# Patient Record
Sex: Female | Born: 1990 | Race: White | Hispanic: No | Marital: Single | Smoking: Current every day smoker
Health system: Southern US, Community
[De-identification: ages and names within clinical notes are randomized; demographics above are authoritative.]

## PROBLEM LIST (undated history)

## (undated) DIAGNOSIS — F1910 Other psychoactive substance abuse, uncomplicated: Secondary | ICD-10-CM

## (undated) DIAGNOSIS — K0889 Other specified disorders of teeth and supporting structures: Secondary | ICD-10-CM

## (undated) DIAGNOSIS — F32A Depression, unspecified: Secondary | ICD-10-CM

## (undated) DIAGNOSIS — D649 Anemia, unspecified: Secondary | ICD-10-CM

## (undated) DIAGNOSIS — F329 Major depressive disorder, single episode, unspecified: Secondary | ICD-10-CM

## (undated) DIAGNOSIS — F4310 Post-traumatic stress disorder, unspecified: Secondary | ICD-10-CM

## (undated) DIAGNOSIS — T7840XA Allergy, unspecified, initial encounter: Secondary | ICD-10-CM

## (undated) DIAGNOSIS — R220 Localized swelling, mass and lump, head: Secondary | ICD-10-CM

## (undated) DIAGNOSIS — R05 Cough: Secondary | ICD-10-CM

## (undated) HISTORY — DX: Anemia, unspecified: D64.9

## (undated) HISTORY — PX: TONSILLECTOMY: SUR1361

## (undated) HISTORY — DX: Other psychoactive substance abuse, uncomplicated: F19.10

## (undated) HISTORY — DX: Allergy, unspecified, initial encounter: T78.40XA

---
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
## 2012-03-17 ENCOUNTER — Emergency Department (HOSPITAL_COMMUNITY)
Admission: EM | Admit: 2012-03-17 | Discharge: 2012-03-17 | Disposition: A | Discharge status: Home / Self Care | Payer: Medicaid Other | Attending: Emergency Medicine | Admitting: Emergency Medicine

## 2012-03-17 ENCOUNTER — Encounter (HOSPITAL_COMMUNITY): Payer: Self-pay | Admitting: Emergency Medicine

## 2012-03-17 DIAGNOSIS — F17200 Nicotine dependence, unspecified, uncomplicated: Secondary | ICD-10-CM | POA: Insufficient documentation

## 2012-03-17 DIAGNOSIS — K029 Dental caries, unspecified: Secondary | ICD-10-CM | POA: Insufficient documentation

## 2012-03-17 DIAGNOSIS — K0889 Other specified disorders of teeth and supporting structures: Secondary | ICD-10-CM

## 2012-03-17 MED ORDER — PENICILLIN V POTASSIUM 250 MG PO TABS
500.0000 mg | ORAL_TABLET | Freq: Once | ORAL | Status: AC
  Administered 2012-03-17: 500 mg via ORAL
  Filled 2012-03-17: qty 2

## 2012-03-17 MED ORDER — HYDROCODONE-ACETAMINOPHEN 5-325 MG PO TABS: ORAL_TABLET | ORAL | Status: AC

## 2012-03-17 MED ORDER — PENICILLIN V POTASSIUM 500 MG PO TABS: 500.0000 mg | ORAL_TABLET | Freq: Four times a day (QID) | ORAL | Status: AC

## 2012-03-17 MED ORDER — HYDROCODONE-ACETAMINOPHEN 5-325 MG PO TABS
1.0000 | ORAL_TABLET | Freq: Once | ORAL | Status: AC
  Administered 2012-03-17: 1 via ORAL
  Filled 2012-03-17: qty 1

## 2012-03-17 NOTE — ED Provider Notes (Signed)
History     CSN: [id number]  Arrival date & time 03/17/12  2012   First MD Initiated Contact with Patient 03/17/12 2100      Chief Complaint  Patient presents with  . Dental Pain    (Consider location/radiation/quality/duration/timing/severity/associated sxs/prior treatment) Patient is a 21 y.o. female presenting with tooth pain. The history is provided by the patient.  Dental PainThe primary symptoms include mouth pain. Primary symptoms do not include dental injury, oral bleeding, oral lesions, headaches, fever, shortness of breath, sore throat, angioedema or cough. The symptoms began 12 to 24 hours ago. The symptoms are worsening. The symptoms are new. The symptoms occur constantly.  Affected locations include: teeth and gum(s).  Additional symptoms include: dental sensitivity to temperature and gum tenderness. Additional symptoms do not include: gum swelling, purulent gums, trismus, jaw pain, facial swelling, trouble swallowing, drooling, ear pain and swollen glands. Medical issues include: smoking and periodontal disease.    History reviewed. No pertinent past medical history.  Past Surgical History  Procedure Date  . Tonsillectomy     History reviewed. No pertinent family history.  History  Substance Use Topics  . Smoking status: Current Everyday Smoker  . Smokeless tobacco: Not on file  . Alcohol Use: No    OB History    Grav Para Term Preterm Abortions TAB SAB Ect Mult Living                  Review of Systems  Constitutional: Negative for fever and appetite change.  HENT: Positive for dental problem. Negative for ear pain, congestion, sore throat, facial swelling, drooling, trouble swallowing, neck pain and neck stiffness.   Eyes: Negative for pain and visual disturbance.  Respiratory: Negative for cough and shortness of breath.   Neurological: Negative for dizziness, facial asymmetry and headaches.  Hematological: Negative for adenopathy.  All other systems  reviewed and are negative.    Allergies  Review of patient's allergies indicates no known allergies.  Home Medications   Current Outpatient Rx  Name Route Sig Dispense Refill  . ASPIRIN-SALICYLAMIDE-CAFFEINE 325-95-16 MG PO TABS Topical Apply 1 packet topically as needed. *Applied as needed for dental pain    . IBUPROFEN 200 MG PO TABS Oral Take 400 mg by mouth 4 (four) times daily as needed. For pain      BP 111/81  Pulse 76  Temp 98.2 F (36.8 C) (Oral)  Resp 16  Ht 5\' 7"  (1.702 m)  Wt 150 lb (68.04 kg)  BMI 23.49 kg/m2  SpO2 100%  LMP 02/24/2012  Physical Exam  Nursing note and vitals reviewed. Constitutional: She is oriented to person, place, and time. She appears well-developed and well-nourished. No distress.  HENT:  Head: Normocephalic and atraumatic. No trismus in the jaw.  Right Ear: Tympanic membrane and ear canal normal.  Left Ear: Tympanic membrane and ear canal normal.  Mouth/Throat: Uvula is midline, oropharynx is clear and moist and mucous membranes are normal. Dental caries present. No dental abscesses or uvula swelling.       Multiple dental caries and widespread dental dz  Neck: Normal range of motion. Neck supple.  Cardiovascular: Normal rate, regular rhythm and normal heart sounds.   No murmur heard. Pulmonary/Chest: Effort normal and breath sounds normal.  Musculoskeletal: Normal range of motion.  Lymphadenopathy:    She has no cervical adenopathy.  Neurological: She is alert and oriented to person, place, and time. She exhibits normal muscle tone. Coordination normal.  Skin: Skin is warm  and dry.    ED Course  Procedures (including critical care time)  Labs Reviewed - No data to display No results found.      MDM    Multiple left  Lower dental caries.  No abscess.  No facial edema or trismus.  Pt is non-toxic appearing.  Advised to f/u with a dentist.    The patient appears reasonably screened and/or stabilized for discharge and I  doubt any other medical condition or other [facility] requiring further screening, evaluation, or treatment in the ED at this time prior to discharge.   Prescribed: Pen VK Norco #[id number]      Shogo Larkey L. [place], [place] 03/19/12 1733

## 2012-03-17 NOTE — ED Notes (Signed)
Pt c/o left sided dental pain that began this morning. Pt describes pain as constant and throbbing. Pt was tearful during assessment.

## 2012-03-17 NOTE — ED Notes (Signed)
Patient complaining of throbbing pain to bottom left side of mouth that started today. States it hurts to eat or drink anything.

## 2012-03-19 NOTE — ED Provider Notes (Signed)
Medical screening examination/treatment/procedure(s) were performed by non-physician practitioner and as supervising physician I was immediately available for consultation/collaboration.   Glynn Octave, MD 03/19/12 [phone]

## 2012-09-27 ENCOUNTER — Encounter (HOSPITAL_COMMUNITY): Payer: Self-pay | Admitting: *Deleted

## 2012-09-27 ENCOUNTER — Emergency Department (HOSPITAL_COMMUNITY)
Admission: EM | Admit: 2012-09-27 | Discharge: 2012-09-27 | Disposition: A | Discharge status: Home / Self Care | Payer: Self-pay | Attending: Emergency Medicine | Admitting: Emergency Medicine

## 2012-09-27 DIAGNOSIS — R51 Headache: Secondary | ICD-10-CM | POA: Insufficient documentation

## 2012-09-27 DIAGNOSIS — K047 Periapical abscess without sinus: Secondary | ICD-10-CM | POA: Insufficient documentation

## 2012-09-27 DIAGNOSIS — F17200 Nicotine dependence, unspecified, uncomplicated: Secondary | ICD-10-CM | POA: Insufficient documentation

## 2012-09-27 MED ORDER — PENICILLIN V POTASSIUM 250 MG PO TABS: 250.0000 mg | ORAL_TABLET | Freq: Four times a day (QID) | ORAL | Status: AC

## 2012-09-27 MED ORDER — TRAMADOL HCL 50 MG PO TABS: 50.0000 mg | ORAL_TABLET | Freq: Four times a day (QID) | ORAL | Status: DC | PRN

## 2012-09-27 NOTE — ED Provider Notes (Signed)
History     CSN: [id number]  Arrival date & time 09/27/12  1352   First MD Initiated Contact with Patient 09/27/12 1452      Chief Complaint  Patient presents with  . Dental Pain    HPI [name] is a 22 y.o. female who presents to the ED with dental pain. The pain started yesterday. She also reports swelling, pain and bloody drainage around the left lower second molar.the history was provided by the patient.   History reviewed. No pertinent past medical history.  Past Surgical History  Procedure Laterality Date  . Tonsillectomy      No family history on file.  History  Substance Use Topics  . Smoking status: Current Every Day Smoker  . Smokeless tobacco: Not on file  . Alcohol Use: No    OB History   Grav Para Term Preterm Abortions TAB SAB Ect Mult Living                  Review of Systems  Constitutional: Negative for fever, chills and activity change.  HENT: Positive for dental problem. Negative for ear pain, facial swelling and neck pain.   Eyes: Negative for pain.  Respiratory: Negative for cough and wheezing.   Cardiovascular: Negative for chest pain.  Gastrointestinal: Negative for nausea, vomiting and abdominal pain.  Skin: Negative for rash.  Neurological: Positive for headaches.  Psychiatric/Behavioral: Negative for decreased concentration. The patient is not nervous/anxious.     Allergies  Review of patient's allergies indicates no known allergies.  Home Medications   Current Outpatient Rx  Name  Route  Sig  Dispense  Refill  . Aspirin-Salicylamide-Caffeine (BC HEADACHE) 325-95-16 MG TABS   Topical   Apply 1 packet topically as needed. *Applied as needed for dental pain         . ibuprofen (ADVIL,MOTRIN) 200 MG tablet   Oral   Take 400 mg by mouth 4 (four) times daily as needed. For pain           BP 117/65  Pulse 74  Temp(Src) 98.3 F (36.8 C) (Oral)  Resp 16  SpO2 99%  LMP 09/20/2012  Physical Exam  Nursing note and  vitals reviewed. Constitutional: She is oriented to person, place, and time. She appears well-developed and well-nourished. No distress.  HENT:  Head: Normocephalic and atraumatic.  Right Ear: External ear normal.  Left Ear: External ear normal.  Mouth/Throat: Uvula is midline and oropharynx is clear and moist.    Swelling and tenderness of gum noted surrounding second lower molar left.   Eyes: EOM are normal. Pupils are equal, round, and reactive to light.  Neck: Neck supple.  Cardiovascular: Normal rate and regular rhythm.   Pulmonary/Chest: Effort normal and breath sounds normal.  Musculoskeletal: Normal range of motion. She exhibits no edema.  Neurological: She is alert and oriented to person, place, and time. No cranial nerve deficit.  Skin: Skin is warm and dry.  Psychiatric: She has a normal mood and affect. Her behavior is normal. Judgment and thought content normal.   Procedures  Assessment: 22 y.o. female with dental pain   Abscess second left lower molar  Plan:  Pain management   Antibiotic  Discussed with the patient and all questioned fully answered.   Medication List    TAKE these medications       penicillin v potassium 250 MG tablet  Commonly known as:  VEETID  Take 1 tablet (250 mg total) by mouth 4 (  four) times daily.     traMADol 50 MG tablet  Commonly known as:  ULTRAM  Take 1 tablet (50 mg total) by mouth every 6 (six) hours as needed for pain.      ASK your doctor about these medications       BC HEADACHE 325-95-16 MG Tabs  Generic drug:  Aspirin-Salicylamide-Caffeine  Apply 1 packet topically as needed. *Applied as needed for dental pain     ibuprofen 200 MG tablet  Commonly known as:  ADVIL,MOTRIN  Take 400 mg by mouth 4 (four) times daily as needed. For pain           [facility], NP 09/27/12 1535

## 2012-09-27 NOTE — ED Provider Notes (Signed)
Medical screening examination/treatment/procedure(s) were performed by non-physician practitioner and as supervising physician I was immediately available for consultation/collaboration. Jennamarie Goings, MD, FACEP   Christana Angelica L Christien Frankl, MD 09/27/12 1622 

## 2012-09-27 NOTE — ED Notes (Signed)
Dental pain since yesterday. Pt also states bleeding and swelling to the gums.

## 2012-09-27 NOTE — ED Notes (Signed)
Pt with dental pain since yesterday, denies seeing a dentist, states that her medicaid ran out, informed pt that she will have to F/U with dentist soon

## 2013-11-02 ENCOUNTER — Encounter (HOSPITAL_COMMUNITY): Payer: Self-pay | Admitting: Emergency Medicine

## 2013-11-02 ENCOUNTER — Emergency Department (HOSPITAL_COMMUNITY)
Admission: EM | Admit: 2013-11-02 | Discharge: 2013-11-02 | Disposition: A | Discharge status: Home / Self Care | Payer: Medicaid Other | Attending: Emergency Medicine | Admitting: Emergency Medicine

## 2013-11-02 DIAGNOSIS — O909 Complication of the puerperium, unspecified: Principal | ICD-10-CM

## 2013-11-02 DIAGNOSIS — K089 Disorder of teeth and supporting structures, unspecified: Secondary | ICD-10-CM | POA: Insufficient documentation

## 2013-11-02 DIAGNOSIS — O26899 Other specified pregnancy related conditions, unspecified trimester: Secondary | ICD-10-CM | POA: Insufficient documentation

## 2013-11-02 DIAGNOSIS — O99335 Smoking (tobacco) complicating the puerperium: Secondary | ICD-10-CM | POA: Insufficient documentation

## 2013-11-02 DIAGNOSIS — K029 Dental caries, unspecified: Secondary | ICD-10-CM | POA: Insufficient documentation

## 2013-11-02 DIAGNOSIS — K0889 Other specified disorders of teeth and supporting structures: Secondary | ICD-10-CM

## 2013-11-02 MED ORDER — BUPIVACAINE HCL (PF) 0.25 % IJ SOLN
10.0000 mL | Freq: Once | INTRAMUSCULAR | Status: AC
  Administered 2013-11-02: 10 mL

## 2013-11-02 MED ORDER — AMOXICILLIN 500 MG PO CAPS: 500.0000 mg | ORAL_CAPSULE | Freq: Three times a day (TID) | ORAL | Status: DC

## 2013-11-02 MED ORDER — TRAMADOL HCL 50 MG PO TABS: 50.0000 mg | ORAL_TABLET | Freq: Four times a day (QID) | ORAL | Status: DC | PRN

## 2013-11-02 NOTE — ED Notes (Signed)
Right sided tooth pain x 2 days.  Sore throat starting today.

## 2013-11-02 NOTE — ED Provider Notes (Signed)
CSN: [id number]     Arrival date & time 11/02/13  1418 History   None    Chief Complaint  Patient presents with  . Sore Throat  . Dental Pain     (Consider location/radiation/quality/duration/timing/severity/associated sxs/prior Treatment) HPI  Patient presents to the emergency department with a dental complaint. Symptoms began a few weeks ago but she was unable to have her teeth pulled since she was pregnant. Now she has had the baby (5 days ago) and her pregnancy medicaid has lapsed. The patient has tried to alleviate pain with goody powders, she is not breast feeding..  Pain rated at a 10/10, characterized as throbbing in nature and located right lower molar. Patient denies fever, night sweats, chills, difficulty swallowing or opening mouth, SOB, nuchal rigidity or decreased ROM of neck.  Patient does not have a dentist and requests a resource guide at discharge.   History reviewed. No pertinent past medical history. Past Surgical History  Procedure Laterality Date  . Tonsillectomy     No family history on file. History  Substance Use Topics  . Smoking status: Current Every Day Smoker    Types: Cigarettes  . Smokeless tobacco: Not on file  . Alcohol Use: No   OB History   Grav Para Term Preterm Abortions TAB SAB Ect Mult Living                 Review of Systems  The patient denies anorexia, fever, weight loss,, vision loss, decreased hearing, hoarseness, chest pain, syncope, dyspnea on exertion, peripheral edema, balance deficits, hemoptysis, abdominal pain, melena, hematochezia, severe indigestion/heartburn, hematuria, incontinence, genital sores, muscle weakness, suspicious skin lesions, transient blindness, difficulty walking, depression, unusual weight change, abnormal bleeding, enlarged lymph nodes, angioedema, and breast masses.   Allergies  Review of patient's allergies indicates no known allergies.  Home Medications   Current Outpatient Rx  Name  Route  Sig   Dispense  Refill  . amoxicillin (AMOXIL) 500 MG capsule   Oral   Take 1 capsule (500 mg total) by mouth 3 (three) times daily.   21 capsule   0   . Aspirin-Salicylamide-Caffeine (BC HEADACHE) 325-95-16 MG TABS   Topical   Apply 1 packet topically as needed. *Applied as needed for dental pain         . ibuprofen (ADVIL,MOTRIN) 200 MG tablet   Oral   Take 400 mg by mouth 4 (four) times daily as needed. For pain         . traMADol (ULTRAM) 50 MG tablet   Oral   Take 1 tablet (50 mg total) by mouth every 6 (six) hours as needed for pain.   15 tablet   0   . traMADol (ULTRAM) 50 MG tablet   Oral   Take 1 tablet (50 mg total) by mouth every 6 (six) hours as needed.   15 tablet   0    BP 116/72  Pulse 101  Temp(Src) 98.2 F (36.8 C) (Oral)  Resp 18  Ht 5\' 7"  (1.702 m)  Wt 163 lb 1 oz (73.965 kg)  BMI 25.53 kg/m2  SpO2 97%  LMP 11/02/2013 Physical Exam  Constitutional: She appears well-developed and well-nourished. No distress.  HENT:  Head: Normocephalic and atraumatic.  Mouth/Throat: Uvula is midline, oropharynx is clear and moist and mucous membranes are normal. Normal dentition. Dental caries (Pts tooth shows no obvious abscess but moderate to severe tenderness to palpation of marked tooth) present. No uvula swelling.    Eyes:  Pupils are equal, round, and reactive to light.  Neck: Trachea normal, normal range of motion and full passive range of motion without pain. Neck supple.  Cardiovascular: Normal rate, regular rhythm, normal heart sounds and normal pulses.   Pulmonary/Chest: Effort normal and breath sounds normal. No respiratory distress. Chest wall is not dull to percussion. She exhibits no tenderness, no crepitus, no edema, no deformity and no retraction.  Abdominal: Normal appearance.  Musculoskeletal: Normal range of motion.  Neurological: She is alert. She has normal strength.  Skin: Skin is warm, dry and intact. She is not diaphoretic.  Psychiatric: She  has a normal mood and affect. Her speech is normal. Cognition and memory are normal.    ED Course  NERVE BLOCK Date/Time: 11/02/2013 2:50 PM Performed by: Dorthula MatasGREENE, Brindley Madarang G Authorized by: Dorthula MatasGREENE, Jaysie Benthall G Consent: Verbal consent obtained. Risks and benefits: risks, benefits and alternatives were discussed Consent given by: patient Indications: pain relief Body area: face/mouth Laterality: right (right lower molar) Patient sedated: no Needle gauge: 25 G Local anesthetic: bupivacaine 0.25% without epinephrine Anesthetic total: 3 ml Outcome: pain improved Patient tolerance: Patient tolerated the procedure well with no immediate complications. Comments: Pt had complete relief of pain   (including critical care time) Labs Review Labs Reviewed - No data to display Imaging Review No results found.   EKG Interpretation None      MDM   Final diagnoses:  Pain, dental    Patient has dental pain. No emergent s/sx's present. Patent airway. No trismus.  Will be given pain medication and antibiotics. I discussed the need to call dentist within 24/48 hours for follow-up. Dental referral given. Return to ED precautions given.  Pt voiced understanding and has agreed to follow-up.   23 y.o.Pam Soto's evaluation in the Emergency Department is complete. It has been determined that no acute conditions requiring further emergency intervention are present at this time. The patient/guardian have been advised of the diagnosis and plan. We have discussed signs and symptoms that warrant return to the ED, such as changes or worsening in symptoms.  Vital signs are stable at discharge. Filed Vitals:   11/02/13 1425  BP: 116/72  Pulse: 101  Temp: 98.2 F (36.8 C)  Resp: 18    Patient/guardian has voiced understanding and agreed to follow-up with the PCP or specialist.      Dorthula Matasiffany G Tremel Setters, PA-C 11/02/13 1451

## 2013-11-02 NOTE — Discharge Instructions (Signed)
Dental Care and Dentist Visits °Dental care supports good overall health. Regular dental visits can also help you avoid dental pain, bleeding, infection, and other more serious health problems in the future. It is important to keep the mouth healthy because diseases in the teeth, gums, and other oral tissues can spread to other areas of the body. Some problems, such as diabetes, heart disease, and pre-term labor have been associated with poor oral health.  °See your dentist every 6 months. If you experience emergency problems such as a toothache or broken tooth, go to the dentist right away. If you see your dentist regularly, you may catch problems early. It is easier to be treated for problems in the early stages.  °WHAT TO EXPECT AT A DENTIST VISIT  °Your dentist will look for many common oral health problems and recommend proper treatment. At your regular dental visit, you can expect: °· Gentle cleaning of the teeth and gums. This includes scraping and polishing. This helps to remove the sticky substance around the teeth and gums (plaque). Plaque forms in the mouth shortly after eating. Over time, plaque hardens on the teeth as tartar. If tartar is not removed regularly, it can cause problems. Cleaning also helps remove stains. °· Periodic X-rays. These pictures of the teeth and supporting bone will help your dentist assess the health of your teeth. °· Periodic fluoride treatments. Fluoride is a natural mineral shown to help strengthen teeth. Fluoride treatment involves applying a fluoride gel or varnish to the teeth. It is most commonly done in children. °· Examination of the mouth, tongue, jaws, teeth, and gums to look for any oral health problems, such as: °· Cavities (dental caries). This is decay on the tooth caused by plaque, sugar, and acid in the mouth. It is best to catch a cavity when it is small. °· Inflammation of the gums caused by plaque buildup (gingivitis). °· Problems with the mouth or malformed  or misaligned teeth. °· Oral cancer or other diseases of the soft tissues or jaws.  °KEEP YOUR TEETH AND GUMS HEALTHY °For healthy teeth and gums, follow these general guidelines as well as your dentist's specific advice: °· Have your teeth professionally cleaned at the dentist every 6 months. °· Brush twice daily with a fluoride toothpaste. °· Floss your teeth daily.  °· Ask your dentist if you need fluoride supplements, treatments, or fluoride toothpaste. °· Eat a healthy diet. Reduce foods and drinks with added sugar. °· Avoid smoking. °TREATMENT FOR ORAL HEALTH PROBLEMS °If you have oral health problems, treatment varies depending on the conditions present in your teeth and gums. °· Your caregiver will most likely recommend good oral hygiene at each visit. °· For cavities, gingivitis, or other oral health disease, your caregiver will perform a procedure to treat the problem. This is typically done at a separate appointment. Sometimes your caregiver will refer you to another dental specialist for specific tooth problems or for surgery. °SEEK IMMEDIATE DENTAL CARE IF: °· You have pain, bleeding, or soreness in the gum, tooth, jaw, or mouth area. °· A permanent tooth becomes loose or separated from the gum socket. °· You experience a blow or injury to the mouth or jaw area. °Document Released: 04/10/2011 Document Revised: 10/21/2011 Document Reviewed: 04/10/2011 °ExitCare® Patient Information ©2014 [employer]. ° °Dental Pain °A tooth ache may be caused by cavities (tooth decay). Cavities expose the nerve of the tooth to air and hot or cold temperatures. It may come from an infection or abscess (also called a   boil or furuncle) around your tooth. It is also often caused by dental caries (tooth decay). This causes the pain you are having. °DIAGNOSIS  °Your caregiver can diagnose this problem by exam. °TREATMENT  °· If caused by an infection, it may be treated with medications which kill germs (antibiotics) and pain  medications as prescribed by your caregiver. Take medications as directed. °· Only take over-the-counter or prescription medicines for pain, discomfort, or fever as directed by your caregiver. °· Whether the tooth ache today is caused by infection or dental disease, you should see your dentist as soon as possible for further care. °SEEK MEDICAL CARE IF: °The exam and treatment you received today has been provided on an emergency basis only. This is not a substitute for complete medical or dental care. If your problem worsens or new problems (symptoms) appear, and you are unable to meet with your dentist, call or return to this location. °SEEK IMMEDIATE MEDICAL CARE IF:  °· You have a fever. °· You develop redness and swelling of your face, jaw, or neck. °· You are unable to open your mouth. °· You have severe pain uncontrolled by pain medicine. °MAKE SURE YOU:  °· Understand these instructions. °· Will watch your condition. °· Will get help right away if you are not doing well or get worse. °Document Released: 07/29/2005 Document Revised: 10/21/2011 Document Reviewed: 03/16/2008 °ExitCare® Patient Information ©2014 [employer]. ° °

## 2013-11-02 NOTE — ED Notes (Signed)
Dental pain , rt mandibular molar  For 3 days

## 2013-11-05 NOTE — ED Provider Notes (Signed)
Medical screening examination/treatment/procedure(s) were performed by non-physician practitioner and as supervising physician I was immediately available for consultation/collaboration.   EKG Interpretation None       Lamarr Feenstra, MD 11/05/13 1615 

## 2014-04-17 ENCOUNTER — Emergency Department (HOSPITAL_COMMUNITY)
Admission: EM | Admit: 2014-04-17 | Discharge: 2014-04-17 | Disposition: A | Discharge status: Home / Self Care | Payer: Medicaid Other | Attending: Emergency Medicine | Admitting: Emergency Medicine

## 2014-04-17 ENCOUNTER — Encounter (HOSPITAL_COMMUNITY): Payer: Self-pay | Admitting: Emergency Medicine

## 2014-04-17 DIAGNOSIS — F17200 Nicotine dependence, unspecified, uncomplicated: Secondary | ICD-10-CM | POA: Insufficient documentation

## 2014-04-17 DIAGNOSIS — H6120 Impacted cerumen, unspecified ear: Secondary | ICD-10-CM | POA: Insufficient documentation

## 2014-04-17 DIAGNOSIS — J029 Acute pharyngitis, unspecified: Secondary | ICD-10-CM | POA: Insufficient documentation

## 2014-04-17 DIAGNOSIS — K029 Dental caries, unspecified: Secondary | ICD-10-CM | POA: Insufficient documentation

## 2014-04-17 DIAGNOSIS — H6121 Impacted cerumen, right ear: Secondary | ICD-10-CM

## 2014-04-17 MED ORDER — MAGIC MOUTHWASH W/LIDOCAINE: 5.0000 mL | Freq: Three times a day (TID) | ORAL | Status: DC | PRN

## 2014-04-17 MED ORDER — ANTIPYRINE-BENZOCAINE 5.4-1.4 % OT SOLN
3.0000 [drp] | Freq: Once | OTIC | Status: AC
  Administered 2014-04-17: 4 [drp] via OTIC
  Filled 2014-04-17: qty 10

## 2014-04-17 NOTE — ED Notes (Signed)
Pt c/o sore throat and right ear pain that started yesterday. Dry cough and congestion.

## 2014-04-17 NOTE — ED Provider Notes (Signed)
CSN: [id number]     Arrival date & time 04/17/14  1224 History  This chart was scribed for non-physician practitioner, Pauline Aus, PA-C,working with Vida Roller, MD, by Karle Plumber, ED Scribe. This patient was seen in room APFT24/APFT24 and the patient's care was started at 1:31 PM.  Chief Complaint  Patient presents with  . Sore Throat   Patient is a 23 y.o. female presenting with pharyngitis. The history is provided by the patient. No language interpreter was used.  Sore Throat Pertinent negatives include no chest pain, no abdominal pain and no shortness of breath.   HPI Comments:  NOVELLA ABRAHA is a 23 y.o. female who presents to the Emergency Department complaining of moderate sore throat and right ear pain that started two days ago.  Reports decreased hearing from her right ear, dry cough and some mild congestion. She reports taking Advil and some OTC cold medication with minimal relief. Pt is unaware of any sick contacts. She denies fever, chills, productive cough or shortness of breath,  or ear drainage.   History reviewed. No pertinent past medical history. Past Surgical History  Procedure Laterality Date  . Tonsillectomy     History reviewed. No pertinent family history. History  Substance Use Topics  . Smoking status: Current Every Day Smoker    Types: Cigarettes  . Smokeless tobacco: Not on file  . Alcohol Use: No   OB History   Grav Para Term Preterm Abortions TAB SAB Ect Mult Living                 Review of Systems  Constitutional: Negative for fever, chills and fatigue.  HENT: Positive for congestion, ear pain and sore throat. Negative for ear discharge and trouble swallowing.   Respiratory: Negative for cough, chest tightness, shortness of breath and wheezing.   Cardiovascular: Negative for chest pain and palpitations.  Gastrointestinal: Negative for nausea, vomiting, abdominal pain and blood in stool.  Genitourinary: Negative for dysuria, hematuria  and flank pain.  Musculoskeletal: Negative for arthralgias, back pain, myalgias, neck pain and neck stiffness.  Skin: Negative for rash.  Neurological: Negative for dizziness, weakness and numbness.  Hematological: Does not bruise/bleed easily.    Allergies  Review of patient's allergies indicates no known allergies.  Home Medications   Prior to Admission medications   Medication Sig Start Date End Date Taking? Authorizing Provider  Pseudoeph-Doxylamine-DM-APAP (NIGHT TIME COLD MEDICINE PO) Take 1 tablet by mouth at bedtime as needed (Cold Symptoms).   Yes Historical Provider, MD   Triage Vitals: BP 113/58  Pulse 84  Temp(Src) 98.9 F (37.2 C) (Oral)  Resp 20  Ht  (1.676 m)  Wt 165 lb (74.844 kg)  BMI 26.64 kg/m2  SpO2 100%  LMP 04/17/2014 Physical Exam  Nursing note and vitals reviewed. Constitutional: She is oriented to person, place, and time. She appears well-developed and well-nourished.  HENT:  Head: Normocephalic and atraumatic.  Mouth/Throat: Uvula is midline and mucous membranes are normal. No oral lesions. No trismus in the jaw. Dental caries present. No dental abscesses or uvula swelling. Posterior oropharyngeal erythema present. No oropharyngeal exudate, posterior oropharyngeal edema or tonsillar abscesses.  Bilateral ears with cerumen impaction. Right TM not visualized.  Left TM appears intact. Multiple dental caries with poor dental hygiene.  Eyes: EOM are normal.  Neck: Normal range of motion. Neck supple.  Cardiovascular: Normal rate, normal heart sounds and intact distal pulses.   No murmur heard. Pulmonary/Chest: Effort normal and breath  sounds normal. No respiratory distress.  Musculoskeletal: Normal range of motion.  Lymphadenopathy:    She has no cervical adenopathy.  Neurological: She is alert and oriented to person, place, and time.  Skin: Skin is warm and dry.  Psychiatric: She has a normal mood and affect. Her behavior is normal.    ED  Course  Procedures (including critical care time) DIAGNOSTIC STUDIES: Oxygen Saturation is 100% on RA, normal by my interpretation.   COORDINATION OF CARE: 1:36 PM- Will irrigate ears. . Pt verbalizes understanding and agrees to plan.  Medications  antipyrine-benzocaine (AURALGAN) otic solution 3-4 drop (not administered)    Labs Review Labs Reviewed - No data to display  Imaging Review No results found.   EKG Interpretation None      MDM   Final diagnoses:  Pharyngitis  Cerumen impaction, right    Pt is well appearing, MM moist, airway patent no hx of fever, neck pain, or lymphadenopathy, likely viral illness. On recheck, large amt of cerumen was irrigated from the right ear and pain resolved.   Pt reports feeling better and ready  for d/c.    I personally performed the services described in this documentation, which was scribed in my presence. The recorded information has been reviewed and is accurate.    Younes Degeorge L. Trisha Mangle, PA-C 04/18/14 2135

## 2014-04-17 NOTE — Discharge Instructions (Signed)
Cerumen Impaction A cerumen impaction is when the wax in your ear forms a plug. This plug usually causes reduced hearing. Sometimes it also causes an earache or dizziness. Removing a cerumen impaction can be difficult and painful. The wax sticks to the ear canal. The canal is sensitive and bleeds easily. If you try to remove a heavy wax buildup with a cotton tipped swab, you may push it in further. Irrigation with water, suction, and small ear curettes may be used to clear out the wax. If the impaction is fixed to the skin in the ear canal, ear drops may be needed for a few days to loosen the wax. People who build up a lot of wax frequently can use ear wax removal products available in your local drugstore. SEEK MEDICAL CARE IF:  You develop an earache, increased hearing loss, or marked dizziness. Document Released: 09/05/2004 Document Revised: 10/21/2011 Document Reviewed: 10/26/2009 [facility] Patient Information 2015 [place], [place]. This information is not intended to replace advice given to you by your health care provider. Make sure you discuss any questions you have with your health care provider.  Pharyngitis Pharyngitis is a sore throat (pharynx). There is redness, pain, and swelling of your throat. HOME CARE   Drink enough fluids to keep your pee (urine) clear or pale yellow.  Only take medicine as told by your doctor.  You may get sick again if you do not take medicine as told. Finish your medicines, even if you start to feel better.  Do not take aspirin.  Rest.  Rinse your mouth (gargle) with salt water ( tsp of salt per 1 qt of water) every 1-2 hours. This will help the pain.  If you are not at risk for choking, you can suck on hard candy or sore throat lozenges. GET HELP IF:  You have large, tender lumps on your neck.  You have a rash.  You cough up green, yellow-brown, or bloody spit. GET HELP RIGHT AWAY IF:   You have a stiff neck.  You drool or cannot swallow  liquids.  You throw up (vomit) or are not able to keep medicine or liquids down.  You have very bad pain that does not go away with medicine.  You have problems breathing (not from a stuffy nose). MAKE SURE YOU:   Understand these instructions.  Will watch your condition.  Will get help right away if you are not doing well or get worse. Document Released: 01/15/2008 Document Revised: 05/19/2013 Document Reviewed: 04/05/2013 [facility] Patient Information 2015 [place], [place]. This information is not intended to replace advice given to you by your health care provider. Make sure you discuss any questions you have with your health care provider.

## 2014-04-20 NOTE — ED Provider Notes (Signed)
Medical screening examination/treatment/procedure(s) were performed by non-physician practitioner and as supervising physician I was immediately available for consultation/collaboration.    Makyle Eslick D Evgenia Merriman, MD 04/20/14 2334 

## 2014-08-02 ENCOUNTER — Emergency Department (HOSPITAL_COMMUNITY)
Admission: EM | Admit: 2014-08-02 | Discharge: 2014-08-02 | Disposition: A | Discharge status: Home / Self Care | Payer: Medicaid Other | Attending: Emergency Medicine | Admitting: Emergency Medicine

## 2014-08-02 ENCOUNTER — Encounter (HOSPITAL_COMMUNITY): Payer: Self-pay | Admitting: Emergency Medicine

## 2014-08-02 DIAGNOSIS — K047 Periapical abscess without sinus: Secondary | ICD-10-CM

## 2014-08-02 DIAGNOSIS — K029 Dental caries, unspecified: Secondary | ICD-10-CM | POA: Insufficient documentation

## 2014-08-02 DIAGNOSIS — K088 Other specified disorders of teeth and supporting structures: Secondary | ICD-10-CM | POA: Diagnosis present

## 2014-08-02 DIAGNOSIS — Z720 Tobacco use: Secondary | ICD-10-CM | POA: Diagnosis not present

## 2014-08-02 MED ORDER — IBUPROFEN 800 MG PO TABS
800.0000 mg | ORAL_TABLET | Freq: Once | ORAL | Status: AC
  Administered 2014-08-02: 800 mg via ORAL
  Filled 2014-08-02: qty 1

## 2014-08-02 MED ORDER — IBUPROFEN 800 MG PO TABS: 800.0000 mg | ORAL_TABLET | Freq: Three times a day (TID) | ORAL | Status: DC

## 2014-08-02 MED ORDER — ACETAMINOPHEN-CODEINE #3 300-30 MG PO TABS
2.0000 | ORAL_TABLET | Freq: Once | ORAL | Status: AC
  Administered 2014-08-02: 2 via ORAL
  Filled 2014-08-02: qty 2

## 2014-08-02 MED ORDER — AMOXICILLIN 500 MG PO CAPS: 500.0000 mg | ORAL_CAPSULE | Freq: Three times a day (TID) | ORAL | Status: DC

## 2014-08-02 MED ORDER — PROMETHAZINE HCL 12.5 MG PO TABS
12.5000 mg | ORAL_TABLET | Freq: Once | ORAL | Status: AC
  Administered 2014-08-02: 12.5 mg via ORAL
  Filled 2014-08-02: qty 1

## 2014-08-02 MED ORDER — AMOXICILLIN 250 MG PO CAPS
500.0000 mg | ORAL_CAPSULE | Freq: Once | ORAL | Status: AC
  Administered 2014-08-02: 500 mg via ORAL
  Filled 2014-08-02: qty 2

## 2014-08-02 MED ORDER — ACETAMINOPHEN-CODEINE #3 300-30 MG PO TABS: 1.0000 | ORAL_TABLET | Freq: Four times a day (QID) | ORAL | Status: DC | PRN

## 2014-08-02 NOTE — ED Notes (Signed)
Pt reports abscess of L upper jaw tooth.

## 2014-08-02 NOTE — ED Provider Notes (Signed)
CSN: [id number]     Arrival date & time 08/02/14  1332 History   First MD Initiated Contact with Patient 08/02/14 1445     Chief Complaint  Patient presents with  . Dental Pain     (Consider location/radiation/quality/duration/timing/severity/associated sxs/prior Treatment) Patient is a 23 y.o. female presenting with tooth pain. The history is provided by the patient.  Dental Pain Location:  Upper Quality:  Throbbing Severity:  Severe Onset quality:  Gradual Duration:  3 days Timing:  Intermittent Progression:  Worsening Chronicity:  New Context: dental caries   Relieved by:  Nothing Worsened by:  Cold food/drink Associated symptoms: gum swelling   Associated symptoms: no fever, no neck pain and no neck swelling   Risk factors: lack of dental care and smoking     History reviewed. No pertinent past medical history. Past Surgical History  Procedure Laterality Date  . Tonsillectomy     History reviewed. No pertinent family history. History  Substance Use Topics  . Smoking status: Current Every Day Smoker -- 0.50 packs/day    Types: Cigarettes  . Smokeless tobacco: Never Used  . Alcohol Use: No   OB History    Gravida Para Term Preterm AB TAB SAB Ectopic Multiple Living   2 2 2             Review of Systems  Constitutional: Negative for fever and activity change.       All ROS Neg except as noted in HPI  HENT: Positive for dental problem. Negative for nosebleeds.   Eyes: Negative for photophobia and discharge.  Respiratory: Negative for cough, shortness of breath and wheezing.   Cardiovascular: Negative for chest pain and palpitations.  Gastrointestinal: Negative for abdominal pain and blood in stool.  Genitourinary: Negative for dysuria, frequency and hematuria.  Musculoskeletal: Negative for back pain, arthralgias and neck pain.  Skin: Negative.   Neurological: Negative for dizziness, seizures and speech difficulty.  Psychiatric/Behavioral: Negative for  hallucinations and confusion.      Allergies  Review of patient's allergies indicates no known allergies.  Home Medications   Prior to Admission medications   Medication Sig Start Date End Date Taking? Authorizing Provider  ibuprofen (ADVIL,MOTRIN) 200 MG tablet Take 600 mg by mouth every 4 (four) hours as needed for moderate pain.   Yes Historical Provider, MD  Alum & Mag Hydroxide-Simeth (MAGIC MOUTHWASH W/LIDOCAINE) SOLN Take 5 mLs by mouth 3 (three) times daily as needed for mouth pain. Swish and spit, do not swallow Patient not taking: Reported on 08/02/2014 04/17/14   Tammy L. Triplett, PA-C   BP 115/69 mmHg  Pulse 68  Temp(Src) 98.6 F (37 C) (Oral)  Resp 14  Ht 5\' 6"  (1.676 m)  Wt 150 lb (68.04 kg)  BMI 24.22 kg/m2  SpO2 100%  LMP 07/10/2014 Physical Exam  Constitutional: She is oriented to person, place, and time. She appears well-developed and well-nourished.  Non-toxic appearance.  HENT:  Head: Normocephalic.  Right Ear: Tympanic membrane and external ear normal.  Left Ear: Tympanic membrane and external ear normal.  Multiple dental caries of the upper and lower gum areas. There is swelling of the left upper gum. No visible abscess appreciated. There is tenderness to palpation of the left jaw area. The airway is patent. There is no swelling under the tongue.  Eyes: EOM and lids are normal. Pupils are equal, round, and reactive to light.  Neck: Normal range of motion. Neck supple. Carotid bruit is not present.  Cardiovascular: Normal  rate, regular rhythm, normal heart sounds, intact distal pulses and normal pulses.   Pulmonary/Chest: Breath sounds normal. No respiratory distress.  Abdominal: Soft. Bowel sounds are normal. There is no tenderness. There is no guarding.  Musculoskeletal: Normal range of motion.  Lymphadenopathy:       Head (right side): No submandibular adenopathy present.       Head (left side): No submandibular adenopathy present.    She has no  cervical adenopathy.  Neurological: She is alert and oriented to person, place, and time. She has normal strength. No cranial nerve deficit or sensory deficit.  Skin: Skin is warm and dry.  Psychiatric: She has a normal mood and affect. Her speech is normal.  Nursing note and vitals reviewed.   ED Course  Procedures (including critical care time) Labs Review Labs Reviewed - No data to display  Imaging Review No results found.   EKG Interpretation None      MDM  Patient has multiple dental caries of the upper and lower jaw area. There is swelling about the gum, but no visible abscess. There is no evidence for Ludwig's Angina.  Patient is treated with ibuprofen 800 mg, Amoxil 500 mg, and Tylenol with Codeine. She is encouraged to see a dentist as soon as possible.    Final diagnoses:  None    **I have reviewed nursing notes, vital signs, and all appropriate lab and imaging results for this patient.Kathie Dike*    Rilyn Upshaw M Dicie Edelen, PA-C 08/02/14 1517  Donnetta HutchingBrian Cook, MD 08/03/14 [phone]

## 2014-08-02 NOTE — Discharge Instructions (Signed)
Dental Caries °Dental caries is tooth decay. This decay can cause a hole in teeth (cavity) that can get bigger and deeper over time. °HOME CARE °· Brush and floss your teeth. Do this at least two times a day. °· Use a fluoride toothpaste. °· Use a mouth rinse if told by your dentist or doctor. °· Eat less sugary and starchy foods. Drink less sugary drinks. °· Avoid snacking often on sugary and starchy foods. Avoid sipping often on sugary drinks. °· Keep regular checkups and cleanings with your dentist. °· Use fluoride supplements if told by your dentist or doctor. °· Allow fluoride to be applied to teeth if told by your dentist or doctor. °Document Released: 05/07/2008 Document Revised: 12/13/2013 Document Reviewed: 07/31/2012 °[employer]® Patient Information ©2015 [employer]. This information is not intended to replace advice given to you by your health care provider. Make sure you discuss any questions you have with your health care provider. ° °

## 2015-04-13 DIAGNOSIS — K0889 Other specified disorders of teeth and supporting structures: Secondary | ICD-10-CM

## 2015-04-13 DIAGNOSIS — R220 Localized swelling, mass and lump, head: Secondary | ICD-10-CM

## 2015-04-13 HISTORY — DX: Other specified disorders of teeth and supporting structures: K08.89

## 2015-04-13 HISTORY — DX: Localized swelling, mass and lump, head: R22.0

## 2015-05-03 NOTE — H&P (Signed)
HISTORY AND PHYSICAL  Pam Soto is a 24 y.o. female patient with CC: painful teeth.  Denies methamphetamine use.  No diagnosis found.  No past medical history on file.  No current facility-administered medications for this encounter.   Current Outpatient Prescriptions  Medication Sig Dispense Refill  . acetaminophen-codeine (TYLENOL #3) 300-30 MG per tablet Take 1-2 tablets by mouth every 6 (six) hours as needed. 20 tablet 0  . Alum & Mag Hydroxide-Simeth (MAGIC MOUTHWASH W/LIDOCAINE) SOLN Take 5 mLs by mouth 3 (three) times daily as needed for mouth pain. Swish and spit, do not swallow (Patient not taking: Reported on 08/02/2014) 100 mL 0  . amoxicillin (AMOXIL) 500 MG capsule Take 1 capsule (500 mg total) by mouth 3 (three) times daily. 21 capsule 0  . ibuprofen (ADVIL,MOTRIN) 800 MG tablet Take 1 tablet (800 mg total) by mouth 3 (three) times daily. 21 tablet 0   No Known Allergies Active Problems:   * No active hospital problems. *  Vitals: There were no vitals taken for this visit. Lab results:No results found for this or any previous visit (from the past 24 hour(s)). Radiology Results: No results found. General appearance: alert, cooperative and no distress Head: Normocephalic, without obvious abnormality, atraumatic Eyes: negative Nose: Nares normal. Septum midline. Mucosa normal. No drainage or sinus tenderness. Throat: Gross caries all remaining teeth except #28 and 29. Gross plaque and calculus. No purulence, trismus, or exudates. Pharynx clear.  Neck: no adenopathy, supple, symmetrical, trachea midline and thyroid not enlarged, symmetric, no tenderness/mass/nodules Resp: clear to auscultation bilaterally Cardio: regular rate and rhythm, S1, S2 normal, no murmur, click, rub or gallop  Assessment: Rampant dental caries  Plan:Extract multiple teeth, alveoloplasty. General anesthesia. Day surgery.   Georgia Lopes 05/03/2015

## 2015-05-04 ENCOUNTER — Encounter (HOSPITAL_BASED_OUTPATIENT_CLINIC_OR_DEPARTMENT_OTHER): Payer: Self-pay | Admitting: *Deleted

## 2015-05-04 DIAGNOSIS — R059 Cough, unspecified: Secondary | ICD-10-CM

## 2015-05-04 HISTORY — DX: Cough, unspecified: R05.9

## 2015-05-08 ENCOUNTER — Encounter (HOSPITAL_BASED_OUTPATIENT_CLINIC_OR_DEPARTMENT_OTHER): Payer: Self-pay | Admitting: *Deleted

## 2015-05-08 ENCOUNTER — Ambulatory Visit (HOSPITAL_BASED_OUTPATIENT_CLINIC_OR_DEPARTMENT_OTHER)
Admission: RE | Admit: 2015-05-08 | Discharge: 2015-05-08 | Disposition: A | Discharge status: Home / Self Care | Payer: Medicaid Other | Source: Ambulatory Visit | Attending: Oral Surgery | Admitting: Oral Surgery

## 2015-05-08 ENCOUNTER — Ambulatory Visit (HOSPITAL_BASED_OUTPATIENT_CLINIC_OR_DEPARTMENT_OTHER): Payer: Medicaid Other | Admitting: Anesthesiology

## 2015-05-08 ENCOUNTER — Encounter (HOSPITAL_BASED_OUTPATIENT_CLINIC_OR_DEPARTMENT_OTHER)
Admission: RE | Disposition: A | Discharge status: Home / Self Care | Payer: Self-pay | Source: Ambulatory Visit | Attending: Oral Surgery

## 2015-05-08 DIAGNOSIS — K029 Dental caries, unspecified: Secondary | ICD-10-CM | POA: Insufficient documentation

## 2015-05-08 DIAGNOSIS — K083 Retained dental root: Secondary | ICD-10-CM | POA: Insufficient documentation

## 2015-05-08 HISTORY — PX: MULTIPLE EXTRACTIONS WITH ALVEOLOPLASTY: SHX5342

## 2015-05-08 HISTORY — DX: Localized swelling, mass and lump, head: R22.0

## 2015-05-08 HISTORY — DX: Other specified disorders of teeth and supporting structures: K08.89

## 2015-05-08 HISTORY — DX: Cough: R05

## 2015-05-08 SURGERY — MULTIPLE EXTRACTION WITH ALVEOLOPLASTY
Anesthesia: General | Site: Mouth

## 2015-05-08 MED ORDER — SCOPOLAMINE 1 MG/3DAYS TD PT72: 1.0000 | MEDICATED_PATCH | Freq: Once | TRANSDERMAL | Status: DC | PRN

## 2015-05-08 MED ORDER — CEFAZOLIN SODIUM-DEXTROSE 2-3 GM-% IV SOLR
INTRAVENOUS | Status: AC
  Filled 2015-05-08: qty 50

## 2015-05-08 MED ORDER — OXYCODONE HCL 5 MG/5ML PO SOLN: 5.0000 mg | Freq: Once | ORAL | Status: AC | PRN

## 2015-05-08 MED ORDER — DEXAMETHASONE SODIUM PHOSPHATE 4 MG/ML IJ SOLN
INTRAMUSCULAR | Status: DC | PRN
Start: 2015-05-08 — End: 2015-05-08
  Administered 2015-05-08: 10 mg via INTRAVENOUS

## 2015-05-08 MED ORDER — GLYCOPYRROLATE 0.2 MG/ML IJ SOLN
INTRAMUSCULAR | Status: AC
  Filled 2015-05-08: qty 1

## 2015-05-08 MED ORDER — SUCCINYLCHOLINE CHLORIDE 20 MG/ML IJ SOLN
INTRAMUSCULAR | Status: DC | PRN
  Administered 2015-05-08: 100 mg via INTRAVENOUS

## 2015-05-08 MED ORDER — PROMETHAZINE HCL 25 MG/ML IJ SOLN: 6.2500 mg | INTRAMUSCULAR | Status: DC | PRN

## 2015-05-08 MED ORDER — PROPOFOL 10 MG/ML IV BOLUS
INTRAVENOUS | Status: DC | PRN
  Administered 2015-05-08: 200 mg via INTRAVENOUS
  Administered 2015-05-08: 20 mg via INTRAVENOUS

## 2015-05-08 MED ORDER — LIDOCAINE HCL (CARDIAC) 10 MG/ML IV SOLN
INTRAVENOUS | Status: DC | PRN
  Administered 2015-05-08: 100 mg via INTRAVENOUS

## 2015-05-08 MED ORDER — FENTANYL CITRATE (PF) 100 MCG/2ML IJ SOLN
50.0000 ug | INTRAMUSCULAR | Status: DC | PRN
  Administered 2015-05-08: 100 ug via INTRAVENOUS
  Administered 2015-05-08: 50 ug via INTRAVENOUS

## 2015-05-08 MED ORDER — AMOXICILLIN 500 MG PO CAPS: 500.0000 mg | ORAL_CAPSULE | Freq: Three times a day (TID) | ORAL | Status: DC

## 2015-05-08 MED ORDER — ONDANSETRON HCL 4 MG/2ML IJ SOLN
INTRAMUSCULAR | Status: AC
  Filled 2015-05-08: qty 2

## 2015-05-08 MED ORDER — CEFAZOLIN SODIUM-DEXTROSE 2-3 GM-% IV SOLR
INTRAVENOUS | Status: DC | PRN
  Administered 2015-05-08: 2 g via INTRAVENOUS

## 2015-05-08 MED ORDER — OXYCODONE-ACETAMINOPHEN 10-325 MG PO TABS
1.0000 | ORAL_TABLET | ORAL | Status: DC | PRN
Start: 2015-05-08 — End: 2017-02-15

## 2015-05-08 MED ORDER — SUCCINYLCHOLINE CHLORIDE 20 MG/ML IJ SOLN
INTRAMUSCULAR | Status: AC
  Filled 2015-05-08: qty 1

## 2015-05-08 MED ORDER — LIDOCAINE HCL (CARDIAC) 20 MG/ML IV SOLN
INTRAVENOUS | Status: AC
  Filled 2015-05-08: qty 5

## 2015-05-08 MED ORDER — HYDROMORPHONE HCL 1 MG/ML IJ SOLN: 0.2500 mg | INTRAMUSCULAR | Status: DC | PRN

## 2015-05-08 MED ORDER — MIDAZOLAM HCL 2 MG/2ML IJ SOLN
INTRAMUSCULAR | Status: AC
  Filled 2015-05-08: qty 4

## 2015-05-08 MED ORDER — OXYCODONE HCL 5 MG PO TABS
ORAL_TABLET | ORAL | Status: AC
  Filled 2015-05-08: qty 1

## 2015-05-08 MED ORDER — DEXAMETHASONE SODIUM PHOSPHATE 10 MG/ML IJ SOLN
INTRAMUSCULAR | Status: AC
  Filled 2015-05-08: qty 1

## 2015-05-08 MED ORDER — LACTATED RINGERS IV SOLN
INTRAVENOUS | Status: DC
  Administered 2015-05-08 (×2): via INTRAVENOUS

## 2015-05-08 MED ORDER — LIDOCAINE-EPINEPHRINE 2 %-1:100000 IJ SOLN
INTRAMUSCULAR | Status: DC | PRN
  Administered 2015-05-08: 20 mL via INTRADERMAL

## 2015-05-08 MED ORDER — OXYCODONE HCL 5 MG PO TABS
5.0000 mg | ORAL_TABLET | Freq: Once | ORAL | Status: AC | PRN
  Administered 2015-05-08: 5 mg via ORAL

## 2015-05-08 MED ORDER — GLYCOPYRROLATE 0.2 MG/ML IJ SOLN: 0.2000 mg | Freq: Once | INTRAMUSCULAR | Status: DC | PRN

## 2015-05-08 MED ORDER — FENTANYL CITRATE (PF) 100 MCG/2ML IJ SOLN
INTRAMUSCULAR | Status: AC
  Filled 2015-05-08: qty 2

## 2015-05-08 MED ORDER — ONDANSETRON HCL 4 MG/2ML IJ SOLN
INTRAMUSCULAR | Status: DC | PRN
  Administered 2015-05-08: 4 mg via INTRAVENOUS

## 2015-05-08 MED ORDER — MIDAZOLAM HCL 2 MG/2ML IJ SOLN
1.0000 mg | INTRAMUSCULAR | Status: DC | PRN
  Administered 2015-05-08: 2 mg via INTRAVENOUS

## 2015-05-08 SURGICAL SUPPLY — 49 items
BLADE DERMATOME SS (BLADE) IMPLANT
BLADE SURG 15 STRL LF DISP TIS (BLADE) ×1 IMPLANT
BLADE SURG 15 STRL SS (BLADE) ×3
BUR CROSS CUT FISSURE 1.6 (BURR) ×2 IMPLANT
BUR CROSS CUT FISSURE 1.6MM (BURR) ×1
BUR EGG 3PK/BX (BURR) ×2 IMPLANT
BUR EGG ELITE 4.0 (BURR) ×1 IMPLANT
BUR EGG ELITE 4.0MM (BURR)
CANISTER SUCT 1200ML W/VALVE (MISCELLANEOUS) ×3 IMPLANT
CATH ROBINSON RED A/P 10FR (CATHETERS) IMPLANT
CLOSURE WOUND 1/2 X4 (GAUZE/BANDAGES/DRESSINGS)
COVER BACK TABLE 60X90IN (DRAPES) ×3 IMPLANT
COVER MAYO STAND STRL (DRAPES) ×3 IMPLANT
DECANTER SPIKE VIAL GLASS SM (MISCELLANEOUS) IMPLANT
DEPRESSOR TONGUE BLADE STERILE (MISCELLANEOUS) IMPLANT
DRAPE U-SHAPE 76X120 STRL (DRAPES) ×3 IMPLANT
DRSG TEGADERM 4X10 (GAUZE/BANDAGES/DRESSINGS) IMPLANT
GAUZE PACKING FOLDED 2  STR (GAUZE/BANDAGES/DRESSINGS) ×2
GAUZE PACKING FOLDED 2 STR (GAUZE/BANDAGES/DRESSINGS) ×1 IMPLANT
GAUZE PACKING IODOFORM 1/4X15 (GAUZE/BANDAGES/DRESSINGS) IMPLANT
GLOVE BIO SURGEON STRL SZ 6.5 (GLOVE) ×3 IMPLANT
GLOVE BIO SURGEON STRL SZ7.5 (GLOVE) ×3 IMPLANT
GLOVE BIO SURGEONS STRL SZ 6.5 (GLOVE) ×2
GLOVE BIOGEL PI IND STRL 6.5 (GLOVE) ×2 IMPLANT
GLOVE BIOGEL PI INDICATOR 6.5 (GLOVE) ×4
GOWN STRL REUS W/ TWL LRG LVL3 (GOWN DISPOSABLE) ×1 IMPLANT
GOWN STRL REUS W/ TWL XL LVL3 (GOWN DISPOSABLE) ×1 IMPLANT
GOWN STRL REUS W/TWL LRG LVL3 (GOWN DISPOSABLE) ×6
GOWN STRL REUS W/TWL XL LVL3 (GOWN DISPOSABLE) ×3
IV NS 500ML (IV SOLUTION)
IV NS 500ML BAXH (IV SOLUTION) ×1 IMPLANT
NEEDLE HYPO 22GX1.5 SAFETY (NEEDLE) ×5 IMPLANT
NS IRRIG 1000ML POUR BTL (IV SOLUTION) ×3 IMPLANT
PACK BASIN DAY SURGERY FS (CUSTOM PROCEDURE TRAY) ×3 IMPLANT
SLEEVE SCD COMPRESS KNEE MED (MISCELLANEOUS) IMPLANT
SPONGE SURGIFOAM ABS GEL 12-7 (HEMOSTASIS) IMPLANT
STRIP CLOSURE SKIN 1/2X4 (GAUZE/BANDAGES/DRESSINGS) IMPLANT
SUT CHROMIC 3 0 PS 2 (SUTURE) ×4 IMPLANT
SYR 20CC LL (SYRINGE) IMPLANT
SYR BULB 3OZ (MISCELLANEOUS) ×3 IMPLANT
SYR CONTROL 10ML LL (SYRINGE) ×5 IMPLANT
TOOTHBRUSH ADULT (PERSONAL CARE ITEMS) IMPLANT
TOWEL OR 17X24 6PK STRL BLUE (TOWEL DISPOSABLE) ×3 IMPLANT
TOWEL OR NON WOVEN STRL DISP B (DISPOSABLE) ×3 IMPLANT
TRAY DSU PREP LF (CUSTOM PROCEDURE TRAY) IMPLANT
TUBE CONNECTING 20'X1/4 (TUBING) ×1
TUBE CONNECTING 20X1/4 (TUBING) ×2 IMPLANT
TUBING IRRIGATION (MISCELLANEOUS) ×1 IMPLANT
YANKAUER SUCT BULB TIP NO VENT (SUCTIONS) ×3 IMPLANT

## 2015-05-08 NOTE — Anesthesia Procedure Notes (Signed)
Procedure Name: Intubation Date/Time: 05/08/2015 9:37 AM Performed by: Gar Gibbon Pre-anesthesia Checklist: Patient identified, Emergency Drugs available, Suction available and Patient being monitored Patient Re-evaluated:Patient Re-evaluated prior to inductionOxygen Delivery Method: Circle System Utilized Preoxygenation: Pre-oxygenation with 100% oxygen Intubation Type: IV induction Ventilation: Mask ventilation without difficulty Laryngoscope Size: Miller and 3 Grade View: Grade I Nasal Tubes: Nasal prep performed, Nasal Rae, Right and Magill forceps- large, utilized Tube size: 6.5 mm Number of attempts: 1 Placement Confirmation: ETT inserted through vocal cords under direct vision,  positive ETCO2 and breath sounds checked- equal and bilateral Tube secured with: Tape Dental Injury: Teeth and Oropharynx as per pre-operative assessment

## 2015-05-08 NOTE — Anesthesia Postprocedure Evaluation (Signed)
  Anesthesia Post-op Note  Patient: Pam Soto  Procedure(s) Performed: Procedure(s): MULTIPLE TEETH EXTRACTIONS WITH ALVEOLOPLASTY (N/A)  Patient Location: PACU  Anesthesia Type:General  Level of Consciousness: awake and alert   Airway and Oxygen Therapy: Patient Spontanous Breathing  Post-op Pain: none  Post-op Assessment: Post-op Vital signs reviewed              Post-op Vital Signs: Reviewed  Last Vitals:  Filed Vitals:   05/08/15 1129  BP: 101/63  Pulse: 72  Temp: 36.8 C  Resp: 18    Complications: No apparent anesthesia complications

## 2015-05-08 NOTE — H&P (Signed)
H&P documentation  -History and Physical Reviewed  -Patient has been re-examined  -No change in the plan of care  JENSEN,SCOTT M  

## 2015-05-08 NOTE — Discharge Instructions (Signed)

## 2015-05-08 NOTE — Op Note (Signed)
05/08/2015  10:18 AM  PATIENT:  Pam Soto  24 y.o. female  PRE-OPERATIVE DIAGNOSIS:  non-restorable teeth # 2, 3, 5, 6, 7, 8, 9, 10, 11, 15, 16, 20, 21, 22, 23, 24, 25, 26, 27, 30  POST-OPERATIVE DIAGNOSIS:  SAME  PROCEDURE:  Procedure(s): MULTIPLE TEETH EXTRACTIONS teeth # 2, 3, 5, 6, 7, 8, 9, 10, 11, 15, 16, 20, 21, 22, 23, 24, 25, 26, 27, 30; ALVEOLOPLASTY RIGHT AND LEFT MAXILLA AND MANDIBLE  SURGEON:  Surgeon(s): Ocie Doyne, DDS  ANESTHESIA:   local and general  EBL:  minimal  DRAINS: none   SPECIMEN:  No Specimen  COUNTS:  YES  PLAN OF CARE: Discharge to home after PACU  PATIENT DISPOSITION:  PACU - hemodynamically stable.   PROCEDURE DETAILS: Dictation # [id number]  Georgia Lopes, DMD 05/08/2015 10:18 AM

## 2015-05-08 NOTE — Anesthesia Preprocedure Evaluation (Signed)
Anesthesia Evaluation  Patient identified by MRN, date of birth, ID band Patient awake    Reviewed: Allergy & Precautions, NPO status , Patient's Chart, lab work & pertinent test results  Airway Mallampati: II  TM Distance: >3 FB Neck ROM: Full    Dental   Pulmonary Current Smoker,    breath sounds clear to auscultation       Cardiovascular negative cardio ROS   Rhythm:Regular Rate:Normal     Neuro/Psych negative neurological ROS     GI/Hepatic negative GI ROS, Neg liver ROS,   Endo/Other  negative endocrine ROS  Renal/GU negative Renal ROS     Musculoskeletal negative musculoskeletal ROS (+)   Abdominal   Peds  Hematology negative hematology ROS (+)   Anesthesia Other Findings   Reproductive/Obstetrics                             Anesthesia Physical Anesthesia Plan  ASA: II  Anesthesia Plan: General   Post-op Pain Management:    Induction: Intravenous  Airway Management Planned: Nasal ETT  Additional Equipment:   Intra-op Plan:   Post-operative Plan: Extubation in OR  Informed Consent: I have reviewed the patients History and Physical, chart, labs and discussed the procedure including the risks, benefits and alternatives for the proposed anesthesia with the patient or authorized representative who has indicated his/her understanding and acceptance.   Dental advisory given  Plan Discussed with: CRNA  Anesthesia Plan Comments:         Anesthesia Quick Evaluation

## 2015-05-08 NOTE — Transfer of Care (Signed)
Immediate Anesthesia Transfer of Care Note  Patient: Pam Soto  Procedure(s) Performed: Procedure(s): MULTIPLE TEETH EXTRACTIONS WITH ALVEOLOPLASTY (N/A)  Patient Location: PACU  Anesthesia Type:General  Level of Consciousness: awake, sedated and patient cooperative  Airway & Oxygen Therapy: Patient Spontanous Breathing and Patient connected to face mask oxygen  Post-op Assessment: Report given to RN and Post -op Vital signs reviewed and stable  Post vital signs: Reviewed and stable  Last Vitals:  Filed Vitals:   05/08/15 0802  BP: 100/61  Pulse: 68  Temp: 36.8 C  Resp: 18    Complications: No apparent anesthesia complications

## 2015-05-09 ENCOUNTER — Encounter (HOSPITAL_BASED_OUTPATIENT_CLINIC_OR_DEPARTMENT_OTHER): Payer: Self-pay | Admitting: Oral Surgery

## 2015-05-09 LAB — POCT HEMOGLOBIN-HEMACUE: HEMOGLOBIN: 11.1 g/dL — AB (ref 12.0–15.0)

## 2015-05-09 NOTE — Op Note (Signed)
NAME:  Pam Soto, Pam Soto NO.:  [id number]  MEDICAL RECORD NO.:  [id number]  LOCATION:                               FACILITY:  [facility]  PHYSICIAN:  Georgia Lopes, M.D.  DATE OF BIRTH:  18-Jan-1991  DATE OF PROCEDURE:  05/08/2015 DATE OF DISCHARGE:  05/08/2015                              OPERATIVE REPORT   PREOPERATIVE DIAGNOSES:  Rampant dental caries.  Nonrestorable teeth #2, 3, 5, 6, 7, 8, 9, 10, 11, 15, 16, 20, 21, 22, 23, 24, 25, 26, 27, 30.  POSTOPERATIVE DIAGNOSES:  Rampant dental caries.  Nonrestorable teeth #2, 3, 5, 6, 7, 8, 9, 10, 11, 15, 16, 20, 21, 22, 23, 24, 25, 26, 27, 30.  PROCEDURES:  Multiple extractions of teeth #2, 3, 5, 6, 7, 8, 9, 10, 11, 15, 16, 20, 21, 22, 23, 24, 25, 26, 27, 30.  Alveoplasty of right and left maxilla, and mandible.  SURGEON:  Georgia Lopes, M.D.  ANESTHESIA:  General nasal intubation.  DESCRIPTION OF PROCEDURE:  The patient was taken to the operating room and placed on the table in supine position.  General anesthesia was administered and a nasal endotracheal tube was placed and secured.  The eyes were protected and the patient was draped for the procedure.  Time- out was performed.  The posterior pharynx was suctioned and a throat pack was placed.  A 2% lidocaine with 1:100,000 epinephrine was infiltrated in an inferior alveolar block on the right and left side and in the maxilla using buccal and palatal infiltration, total of 20 mL was utilized.  A bite block was placed in the right side of the mouth and a Sweetheart retractor was used to retract the tongue.  A #15 blade was used to make an incision buccally and lingually around teeth #20, 21, 22, 23, 24, 25, and 26 in the mandible and around teeth #16, 15 with a crystal incision connected to teeth # 11, 10, 9, 8, and 7 on the buccal and palatal aspects.  The periosteum was then reflected from around these teeth with a periosteal elevator and then, the teeth were  elevated with a 301 elevator.  The lower teeth were removed using the Asch forceps and the upper teeth were removed using the #150 forceps.  The sockets were then curetted and the periosteum was further reflected to expose the alveolar crest.  Alveoplasty was performed in the left maxilla and mandible using an egg-shaped bur and bone file with contours occasionally achieved.  The areas were irrigated and then closed with 3- 0 chromic.  The Sweetheart retractor and the bite block were then repositioned to the other side of the mouth and attention was turned to the right side.  A #15 blade was used to make an incision around tooth #30 and carried forward along the alveolar crest to tooth #27.  Teeth #28 and 29 were retained per general dentist request for reconstructive purposes.  Then, the 15-blade was used to make an incision around teeth #2 and 3, carried forward along the alveolar crest of teeth #5 and 6. Then, the periosteum was reflected from around the remaining maxillary mandibular teeth.  The teeth were elevated with a 301 elevator.  The maxillary teeth were removed using a #150 forceps in the mandible, tooth #30 fractured upon attempted removal was done with the lower #17 forceps.  Tooth #27 was removed with the Asch forceps.  Tooth #30 then required additional sectioning and bone removal prior to removing of the remaining roots.  Then, the periosteum was reflected after all the teeth were removed and sockets curetted and then the right maxilla and mandible were smoothed using the egg-shaped bur and bone file to complete the alveoplasty.  Then, the areas were irrigated and closed with 3-0 chromic.  The oral cavity was inspected, found to have good contour, hemostasis and closure.  The oral cavity was irrigated and suctioned.  Throat pack was removed.  The patient was awakened, taken to the recovery room, breathing spontaneously in good condition.  ESTIMATED BLOOD LOSS:   Minimum.  COMPLICATIONS:  None.  SPECIMENS:  None.     Georgia Lopes, M.D.     SMJ/MEDQ  D:  05/08/2015  T:  05/09/2015  Job:  [id number]

## 2016-11-06 ENCOUNTER — Encounter: Payer: Self-pay | Admitting: Family Medicine

## 2016-11-06 ENCOUNTER — Ambulatory Visit (INDEPENDENT_AMBULATORY_CARE_PROVIDER_SITE_OTHER): Payer: Medicaid Other | Admitting: Family Medicine

## 2016-11-06 VITALS — BP 107/72 | HR 75 | Temp 98.2°F | Ht 67.0 in | Wt 144.0 lb

## 2016-11-06 DIAGNOSIS — F1120 Opioid dependence, uncomplicated: Secondary | ICD-10-CM | POA: Diagnosis not present

## 2016-11-06 DIAGNOSIS — Z3A35 35 weeks gestation of pregnancy: Secondary | ICD-10-CM
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
## 2017-02-15 ENCOUNTER — Emergency Department (HOSPITAL_COMMUNITY)
Admission: EM | Admit: 2017-02-15 | Discharge: 2017-02-16 | Disposition: A | Discharge status: Home / Self Care | Payer: Medicaid Other | Attending: Emergency Medicine | Admitting: Emergency Medicine

## 2017-02-15 ENCOUNTER — Encounter (HOSPITAL_COMMUNITY): Payer: Self-pay

## 2017-02-15 DIAGNOSIS — F1310 Sedative, hypnotic or anxiolytic abuse, uncomplicated: Secondary | ICD-10-CM | POA: Insufficient documentation

## 2017-02-15 DIAGNOSIS — F17210 Nicotine dependence, cigarettes, uncomplicated: Secondary | ICD-10-CM | POA: Insufficient documentation

## 2017-02-15 DIAGNOSIS — F1120 Opioid dependence, uncomplicated: Secondary | ICD-10-CM | POA: Insufficient documentation

## 2017-02-15 DIAGNOSIS — T50901A Poisoning by unspecified drugs, medicaments and biological substances, accidental (unintentional), initial encounter: Secondary | ICD-10-CM | POA: Diagnosis present

## 2017-02-15 LAB — CBC WITH DIFFERENTIAL/PLATELET
BASOS ABS: 0 10*3/uL (ref 0.0–0.1)
BASOS PCT: 0 %
Eosinophils Absolute: 0.3 10*3/uL (ref 0.0–0.7)
Eosinophils Relative: 4 %
HEMATOCRIT: 35.8 % — AB (ref 36.0–46.0)
Hemoglobin: 11.9 g/dL — ABNORMAL LOW (ref 12.0–15.0)
Lymphocytes Relative: 48 %
Lymphs Abs: 3.7 10*3/uL (ref 0.7–4.0)
MCH: 30.4 pg (ref 26.0–34.0)
MCHC: 33.2 g/dL (ref 30.0–36.0)
MCV: 91.6 fL (ref 78.0–100.0)
MONO ABS: 0.6 10*3/uL (ref 0.1–1.0)
Monocytes Relative: 7 %
NEUTROS ABS: 3.2 10*3/uL (ref 1.7–7.7)
Neutrophils Relative %: 41 %
Platelets: 260 10*3/uL (ref 150–400)
RBC: 3.91 MIL/uL (ref 3.87–5.11)
RDW: 13.5 % (ref 11.5–15.5)
WBC: 7.8 10*3/uL (ref 4.0–10.5)

## 2017-02-15 LAB — I-STAT BETA HCG BLOOD, ED (MC, WL, AP ONLY)

## 2017-02-15 LAB — COMPREHENSIVE METABOLIC PANEL
ALT: 13 U/L — ABNORMAL LOW (ref 14–54)
AST: 21 U/L (ref 15–41)
Albumin: 4.3 g/dL (ref 3.5–5.0)
Alkaline Phosphatase: 64 U/L (ref 38–126)
Anion gap: 11 (ref 5–15)
BILIRUBIN TOTAL: 0.8 mg/dL (ref 0.3–1.2)
BUN: 13 mg/dL (ref 6–20)
CO2: 21 mmol/L — ABNORMAL LOW (ref 22–32)
Calcium: 9.1 mg/dL (ref 8.9–10.3)
Chloride: 109 mmol/L (ref 101–111)
Creatinine, Ser: 0.86 mg/dL (ref 0.44–1.00)
GFR calc Af Amer: >60 mL/min (ref >60)
GFR calc non Af Amer: >60 mL/min (ref >60)
Glucose, Bld: 68 mg/dL (ref 65–99)
POTASSIUM: 3.6 mmol/L (ref 3.5–5.1)
Sodium: 141 mmol/L (ref 135–145)
TOTAL PROTEIN: 7.2 g/dL (ref 6.5–8.1)

## 2017-02-15 LAB — SALICYLATE LEVEL

## 2017-02-15 LAB — ETHANOL

## 2017-02-15 LAB — ACETAMINOPHEN LEVEL

## 2017-02-15 MED ORDER — LORAZEPAM 2 MG/ML IJ SOLN: 0.0000 mg | Freq: Two times a day (BID) | INTRAMUSCULAR | Status: DC

## 2017-02-15 MED ORDER — VITAMIN B-1 100 MG PO TABS: 100.0000 mg | ORAL_TABLET | Freq: Every day | ORAL | Status: DC

## 2017-02-15 MED ORDER — LORAZEPAM 1 MG PO TABS: 0.0000 mg | ORAL_TABLET | Freq: Four times a day (QID) | ORAL | Status: DC

## 2017-02-15 MED ORDER — LORAZEPAM 2 MG/ML IJ SOLN
0.0000 mg | Freq: Four times a day (QID) | INTRAMUSCULAR | Status: DC
Start: 2017-02-15 — End: 2017-02-16

## 2017-02-15 MED ORDER — THIAMINE HCL 100 MG/ML IJ SOLN: 100.0000 mg | Freq: Every day | INTRAMUSCULAR | Status: DC

## 2017-02-15 MED ORDER — LORAZEPAM 1 MG PO TABS: 0.0000 mg | ORAL_TABLET | Freq: Two times a day (BID) | ORAL | Status: DC

## 2017-02-15 MED ORDER — NALOXONE HCL 0.4 MG/ML IJ SOLN
0.4000 mg | Freq: Once | INTRAMUSCULAR | Status: AC
  Administered 2017-02-15: 0.4 mg via INTRAVENOUS
  Filled 2017-02-15: qty 1

## 2017-02-15 NOTE — ED Notes (Signed)
Pt drowsy, will awaken to name being called, but falls back to sleep immediately

## 2017-02-15 NOTE — ED Triage Notes (Signed)
Patient brought in by [employer] from jail for medical clearance. Per detention officer, patient took some medication and now appears drowsy. Unknown medication as patient will not respond to voice or pain. Patient will respond to sternal rub with eye opening and head movement.

## 2017-02-15 NOTE — ED Notes (Signed)
Per poison control: provide supportive care, check asa, tylenol level and lft's.  Monitor for 3-4 hours, if vs are stable pt can be medically cleared.  Notified edp
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
## 2017-02-15 NOTE — ED Notes (Signed)
Pt drowsy, will awaken to name being called, but falls back to sleep immediately  

## 2017-02-16 NOTE — ED Notes (Signed)
Pt still sleeping. Vitals are within ranges when she came in. Officer remains w/ pt.

## 2017-02-16 NOTE — ED Notes (Signed)
Pt given drink & crackers.

## 2017-02-16 NOTE — ED Provider Notes (Signed)
Pt improved She is ambulatory Will d/c with law enforcement    Zadie RhineWickline, Bisma Klett, MD 02/16/17 [id number]

## 2017-02-16 NOTE — ED Notes (Signed)
Pt given pack of nabs & sprite

## 2017-02-16 NOTE — ED Notes (Signed)
Ambulated pt around the nurses station. Pt a little wobbly but able to walk w/o assistance.

## 2019-01-12 ENCOUNTER — Other Ambulatory Visit: Payer: Self-pay

## 2019-01-12 ENCOUNTER — Encounter (HOSPITAL_COMMUNITY): Payer: Self-pay

## 2019-01-12 DIAGNOSIS — Z5321 Procedure and treatment not carried out due to patient leaving prior to being seen by health care provider: Secondary | ICD-10-CM | POA: Insufficient documentation

## 2019-01-12 DIAGNOSIS — R609 Edema, unspecified: Secondary | ICD-10-CM | POA: Insufficient documentation

## 2019-01-12 NOTE — ED Triage Notes (Signed)
Pt presents to ED with complaints of bilateral leg swelling x 4-5 days.

## 2019-01-13 ENCOUNTER — Emergency Department (HOSPITAL_COMMUNITY)
Admission: EM | Admit: 2019-01-13 | Discharge: 2019-01-14 | Disposition: A | Discharge status: Home / Self Care | Payer: Self-pay | Attending: Emergency Medicine | Admitting: Emergency Medicine

## 2019-01-13 ENCOUNTER — Emergency Department (HOSPITAL_COMMUNITY): Payer: Self-pay

## 2019-01-13 ENCOUNTER — Other Ambulatory Visit: Payer: Self-pay

## 2019-01-13 ENCOUNTER — Emergency Department (HOSPITAL_COMMUNITY)
Admission: EM | Admit: 2019-01-13 | Discharge: 2019-01-13 | Disposition: A | Discharge status: Home / Self Care | Payer: Self-pay | Attending: Emergency Medicine | Admitting: Emergency Medicine

## 2019-01-13 ENCOUNTER — Encounter (HOSPITAL_COMMUNITY): Payer: Self-pay | Admitting: Emergency Medicine

## 2019-01-13 DIAGNOSIS — Z79899 Other long term (current) drug therapy: Secondary | ICD-10-CM | POA: Insufficient documentation

## 2019-01-13 DIAGNOSIS — R600 Localized edema: Secondary | ICD-10-CM | POA: Insufficient documentation

## 2019-01-13 DIAGNOSIS — R609 Edema, unspecified: Secondary | ICD-10-CM

## 2019-01-13 DIAGNOSIS — F17210 Nicotine dependence, cigarettes, uncomplicated: Secondary | ICD-10-CM | POA: Insufficient documentation

## 2019-01-13 HISTORY — DX: Post-traumatic stress disorder, unspecified: F43.10

## 2019-01-13 HISTORY — DX: Depression, unspecified: F32.A

## 2019-01-13 LAB — CBC
HCT: 35.1 % — ABNORMAL LOW (ref 36.0–46.0)
Hemoglobin: 11.3 g/dL — ABNORMAL LOW (ref 12.0–15.0)
MCH: 30.6 pg (ref 26.0–34.0)
MCHC: 32.2 g/dL (ref 30.0–36.0)
MCV: 95.1 fL (ref 80.0–100.0)
Platelets: 293 10*3/uL (ref 150–400)
RBC: 3.69 MIL/uL — ABNORMAL LOW (ref 3.87–5.11)
RDW: 12.2 % (ref 11.5–15.5)
WBC: 6.4 10*3/uL (ref 4.0–10.5)
nRBC: 0 % (ref 0.0–0.2)

## 2019-01-13 NOTE — ED Notes (Signed)
Patient came out and ran to the bathroom.

## 2019-01-13 NOTE — ED Triage Notes (Signed)
Pt c/o bilateral leg swelling x one week with headache.

## 2019-01-14 LAB — COMPREHENSIVE METABOLIC PANEL
ALT: 41 U/L (ref 0–44)
AST: 35 U/L (ref 15–41)
Albumin: 3.3 g/dL — ABNORMAL LOW (ref 3.5–5.0)
Alkaline Phosphatase: 60 U/L (ref 38–126)
Anion gap: 7 (ref 5–15)
BUN: 11 mg/dL (ref 6–20)
CO2: 24 mmol/L (ref 22–32)
Calcium: 8.4 mg/dL — ABNORMAL LOW (ref 8.9–10.3)
Chloride: 107 mmol/L (ref 98–111)
Creatinine, Ser: 0.75 mg/dL (ref 0.44–1.00)
GFR calc Af Amer: >60 mL/min (ref >60)
GFR calc non Af Amer: >60 mL/min (ref >60)
Glucose, Bld: 102 mg/dL — ABNORMAL HIGH (ref 70–99)
Potassium: 3.7 mmol/L (ref 3.5–5.1)
Sodium: 138 mmol/L (ref 135–145)
Total Bilirubin: 0.2 mg/dL — ABNORMAL LOW (ref 0.3–1.2)
Total Protein: 6.1 g/dL — ABNORMAL LOW (ref 6.5–8.1)

## 2019-01-14 LAB — BRAIN NATRIURETIC PEPTIDE: B Natriuretic Peptide: 201 pg/mL — ABNORMAL HIGH (ref 0.0–100.0)

## 2019-01-14 LAB — I-STAT BETA HCG BLOOD, ED (MC, WL, AP ONLY): I-stat hCG, quantitative: <5 m[IU]/mL (ref <5)

## 2019-01-14 NOTE — ED Notes (Signed)
Pt left saying that "all her results were fine" and that she was leaving. EDP aware.

## 2019-01-14 NOTE — ED Provider Notes (Signed)
[facility] EMERGENCY DEPARTMENT Provider Note   CSN: [id number] Arrival date & time: 01/13/19  2044    History   Chief Complaint Chief Complaint  Patient presents with  . Leg Swelling    HPI Pam Soto is a 28 y.o. female presenting for evaluation bilateral leg swelling.  Patient states for the past 2 weeks, she has had intermittent lower leg/foot swelling, which is been worse this past week.  Patient states symptoms are worse when she is standing.  She reports pain due to the swelling, pain improved and the swelling decreases.  She denies numbness or tingling.  She denies fall, trauma, or injury.  She was evaluated at [facility], but she states that she had normal blood work, EKG, chest x-ray.  She also reports that she is having increased shortness of breath when lying flat.  She denies fevers, chills, cough, chest pain, nausea, vomiting, abnormal urination, abnormal bowel movements.  She states she has been feeling very thirsty, she has been doing a lot of water.  She denies alcohol or drug use.  She reports daily tobacco use.  Patient reports an intermittent headache, worse today.  She denies vision changes, slurred speech, photophobia, phonophobia.   Patient started a new job 3 weeks ago during which she is standing occasionally, but she works 13 hours a week and thus is not on her feet all the time.    HPI  Past Medical History:  Diagnosis Date  . Allergy   . Anemia   . Cough 05/04/2015  . Depression   . Non-restorable tooth 04/2015   teeth  . PTSD (post-traumatic stress disorder)   . Substance abuse (HCC)   . Swelling of gums 04/2015    Patient Active Problem List   Diagnosis Date Noted  . Opiate dependence (HCC) 11/06/2016    Past Surgical History:  Procedure Laterality Date  . MULTIPLE EXTRACTIONS WITH ALVEOLOPLASTY N/A 05/08/2015   Procedure: MULTIPLE TEETH EXTRACTIONS WITH ALVEOLOPLASTY;  Surgeon: Ocie Doyne, DDS;  Location: [facility];   Service: Oral Surgery;  Laterality: N/A;  . TONSILLECTOMY    . TONSILLECTOMY AND ADENOIDECTOMY  02/19/2001     OB History    Gravida  3   Para  2   Term  2   Preterm      AB      Living        SAB      TAB      Ectopic      Multiple      Live Births               Home Medications    Prior to Admission medications   Medication Sig Start Date End Date Taking? Authorizing Provider  Buprenorphine HCl-Naloxone HCl 4-1 MG FILM Place 1 Film under the tongue daily.    [provider]  Prenatal Vit-Fe Fumarate-FA (MULTIVITAMIN-PRENATAL) 27-0.8 MG TABS tablet Take 1 tablet by mouth daily at 12 noon.    [provider]    Family History Family History  Problem Relation Age of Onset  . Depression Mother   . Drug abuse Mother   . Alcohol abuse Father   . Cirrhosis Father     Social History Social History   Tobacco Use  . Smoking status: Current Every Day Smoker    Packs/day: 1.00    Years: 5.00    Pack years: 5.00    Types: Cigarettes  . Smokeless tobacco: Never Used  Substance Use Topics  . Alcohol use: No  . Drug use: Not Currently    Types: Cocaine    Comment: percocet     Allergies   Tramadol   Review of Systems Review of Systems  Respiratory: Positive for shortness of breath (orthopnea).   Cardiovascular: Positive for leg swelling.  Neurological: Positive for headaches.  All other systems reviewed and are negative.    Physical Exam Updated Vital Signs BP 133/89   Pulse 77   Temp 98.7 F (37.1 C)   Resp 16   Ht 5\' 7"  (1.702 m)   Wt 84.8 kg   LMP 01/11/2019   SpO2 97%   BMI 29.28 kg/m   Physical Exam Vitals signs and nursing note reviewed.  Constitutional:      General: She is not in acute distress.    Appearance: She is well-developed.     Comments: Appears nontoxic  HENT:     Head: Normocephalic and atraumatic.  Eyes:     Extraocular Movements: Extraocular movements intact.     Conjunctiva/sclera:  Conjunctivae normal.     Pupils: Pupils are equal, round, and reactive to light.  Neck:     Musculoskeletal: Normal range of motion and neck supple.  Cardiovascular:     Rate and Rhythm: Normal rate and regular rhythm.     Pulses: Normal pulses.  Pulmonary:     Effort: Pulmonary effort is normal. No respiratory distress.     Breath sounds: Normal breath sounds. No wheezing.     Comments: Speaking in full sentences. Clear lung sounds in all fields Abdominal:     General: There is no distension.     Palpations: Abdomen is soft. There is no mass.     Tenderness: There is no abdominal tenderness. There is no guarding or rebound.  Musculoskeletal: Normal range of motion.     Right lower leg: Edema present.     Left lower leg: Edema present.     Comments: bilateral mild nonpitting edema of the feet. No erythema, warmth, or tenderness. Good pedal pulses and cap refill.   Skin:    General: Skin is warm and dry.     Capillary Refill: Capillary refill takes less than 2 seconds.  Neurological:     Mental Status: She is alert and oriented to person, place, and time.     GCS: GCS eye subscore is 4. GCS verbal subscore is 5. GCS motor subscore is 6.     Cranial Nerves: Cranial nerves are intact.     Sensory: Sensation is intact.     Gait: Gait is intact.      ED Treatments / Results  Labs (all labs ordered are listed, but only abnormal results are displayed) Labs Reviewed  CBC - Abnormal; Notable for the following components:      Result Value   RBC 3.69 (*)    Hemoglobin 11.3 (*)    HCT 35.1 (*)    All other components within normal limits  COMPREHENSIVE METABOLIC PANEL  BRAIN NATRIURETIC PEPTIDE  I-STAT BETA HCG BLOOD, ED ([facility], [facility], [facility] ONLY)    EKG None  Radiology Dg Chest Portable 1 View  Result Date: 01/13/2019 CLINICAL DATA:  Edema. EXAM: PORTABLE CHEST 1 VIEW COMPARISON:  01/09/2019 FINDINGS: The heart size and mediastinal contours are within normal limits. Both lungs are  clear. Increased density over the lower lung zones favored to be secondary to breast tissue attenuation. The visualized skeletal structures are unremarkable. IMPRESSION: No active disease.  Electronically Signed   Katherine Mantle  Green M.D.   On: 01/13/2019 23:23    Procedures Procedures (including critical care time)  Medications Ordered in ED Medications - No data to display   Initial Impression / Assessment and Plan / ED Course  I have reviewed the triage vital signs and the nursing notes.  Pertinent labs & imaging results that were available during my care of the patient were reviewed by me and considered in my medical decision making (see chart for details).        Patient presenting for evaluation of bilateral leg swelling.  Physical exam reassuring, she appears nontoxic.  However, she is reporting increasing leg swelling and orthopnea.  Increasing my concern for hospital return for fluid overload.  However, symptoms also began as she started working and standing more.  Additionally, she reports she has been drinking more fluids and it has been hotter out, as such, increased likelihood for dependent edema.  Discussed likelihood of dependent edema, however will obtain lab work and chest x-ray to ensure no acute or emergent cause for her swelling.  Chest x-ray viewed interpreted by me, no pneumonia, pneumothorax, effusion, cardiomegaly.  So far labs reassuring, BNP pending.   Informed by RN that pt left prior to discussion of results.   Final Clinical Impressions(s) / ED Diagnoses   Final diagnoses:  None    ED Discharge Orders    None       Alveria Apley, PA-C 01/14/19 0045    Dione Booze, MD 01/19/19 2235

## 2019-12-15 IMAGING — CR PORTABLE CHEST - 1 VIEW
1 series · 2 of 2 positions shown · non-contrast
Comparison: 01/09/2019

CLINICAL DATA: Edema.

EXAM:
PORTABLE CHEST 1 VIEW

[Series 1: portable · 0.17mm/px · 2 of 2 slices shown]
[im 1/2]
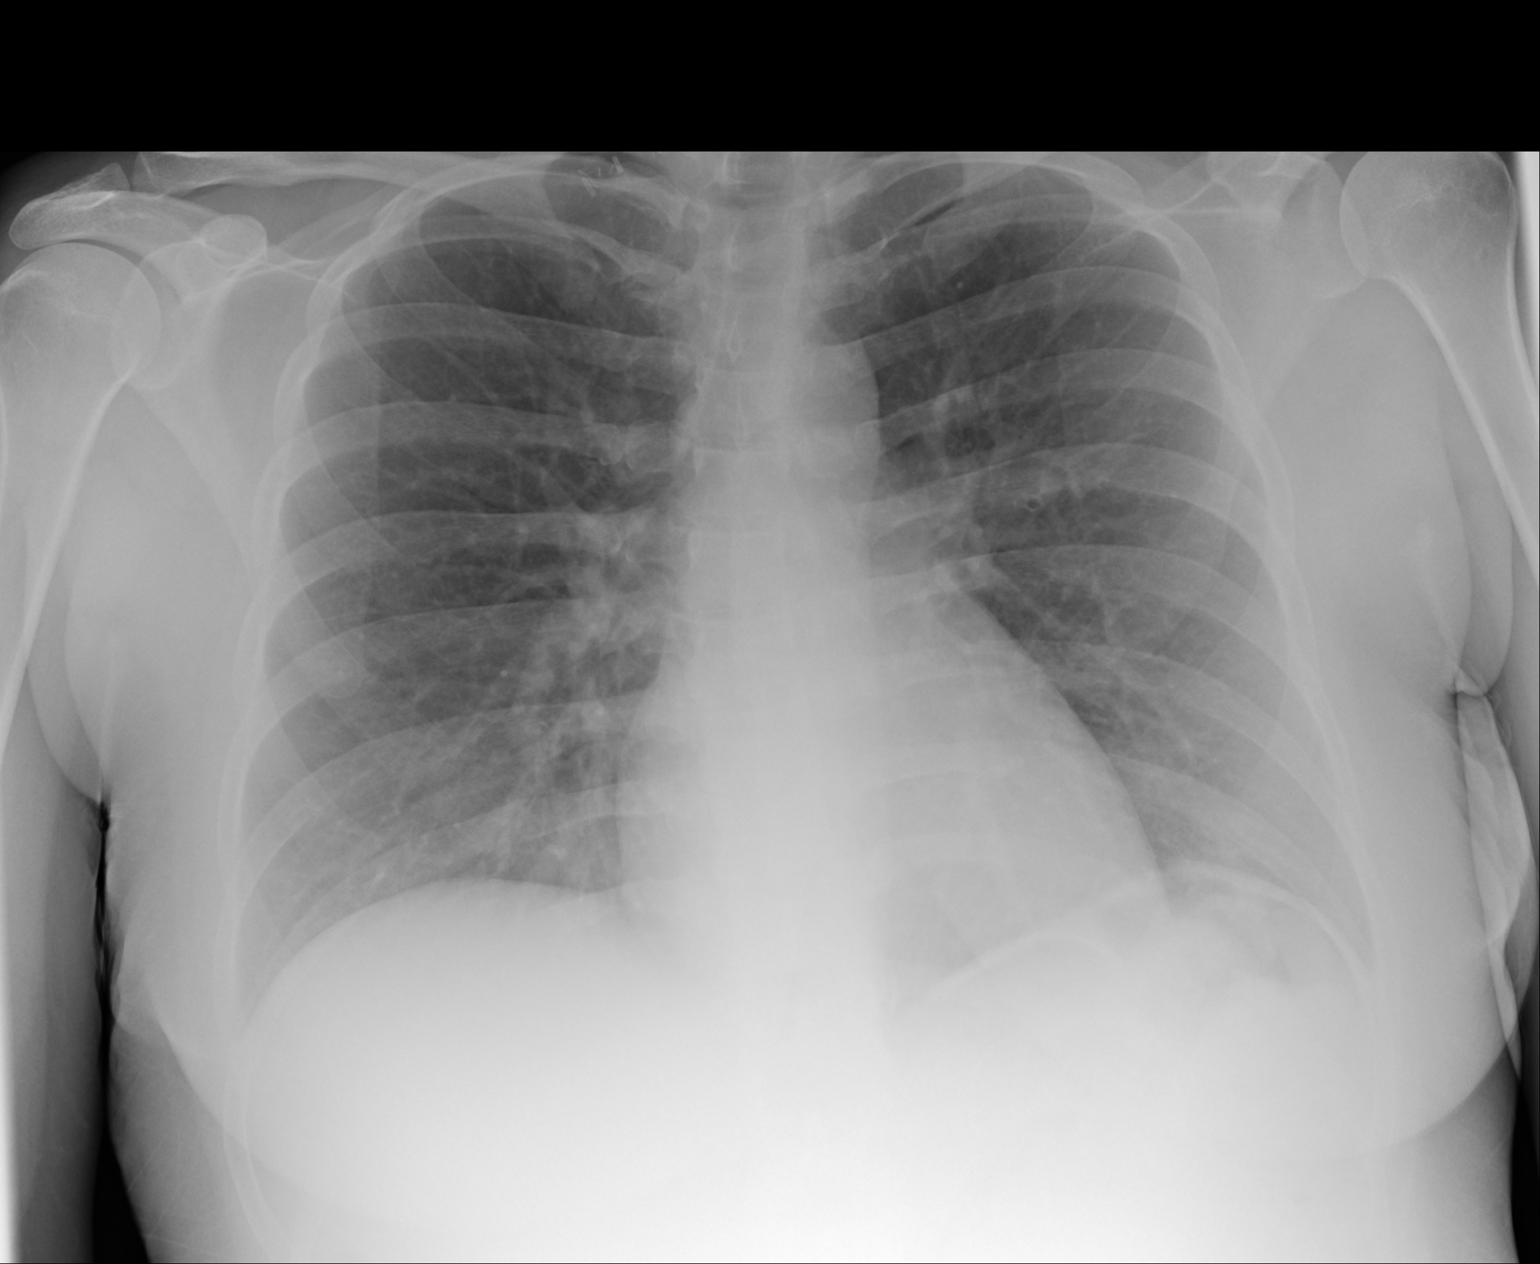
[im 2/2]
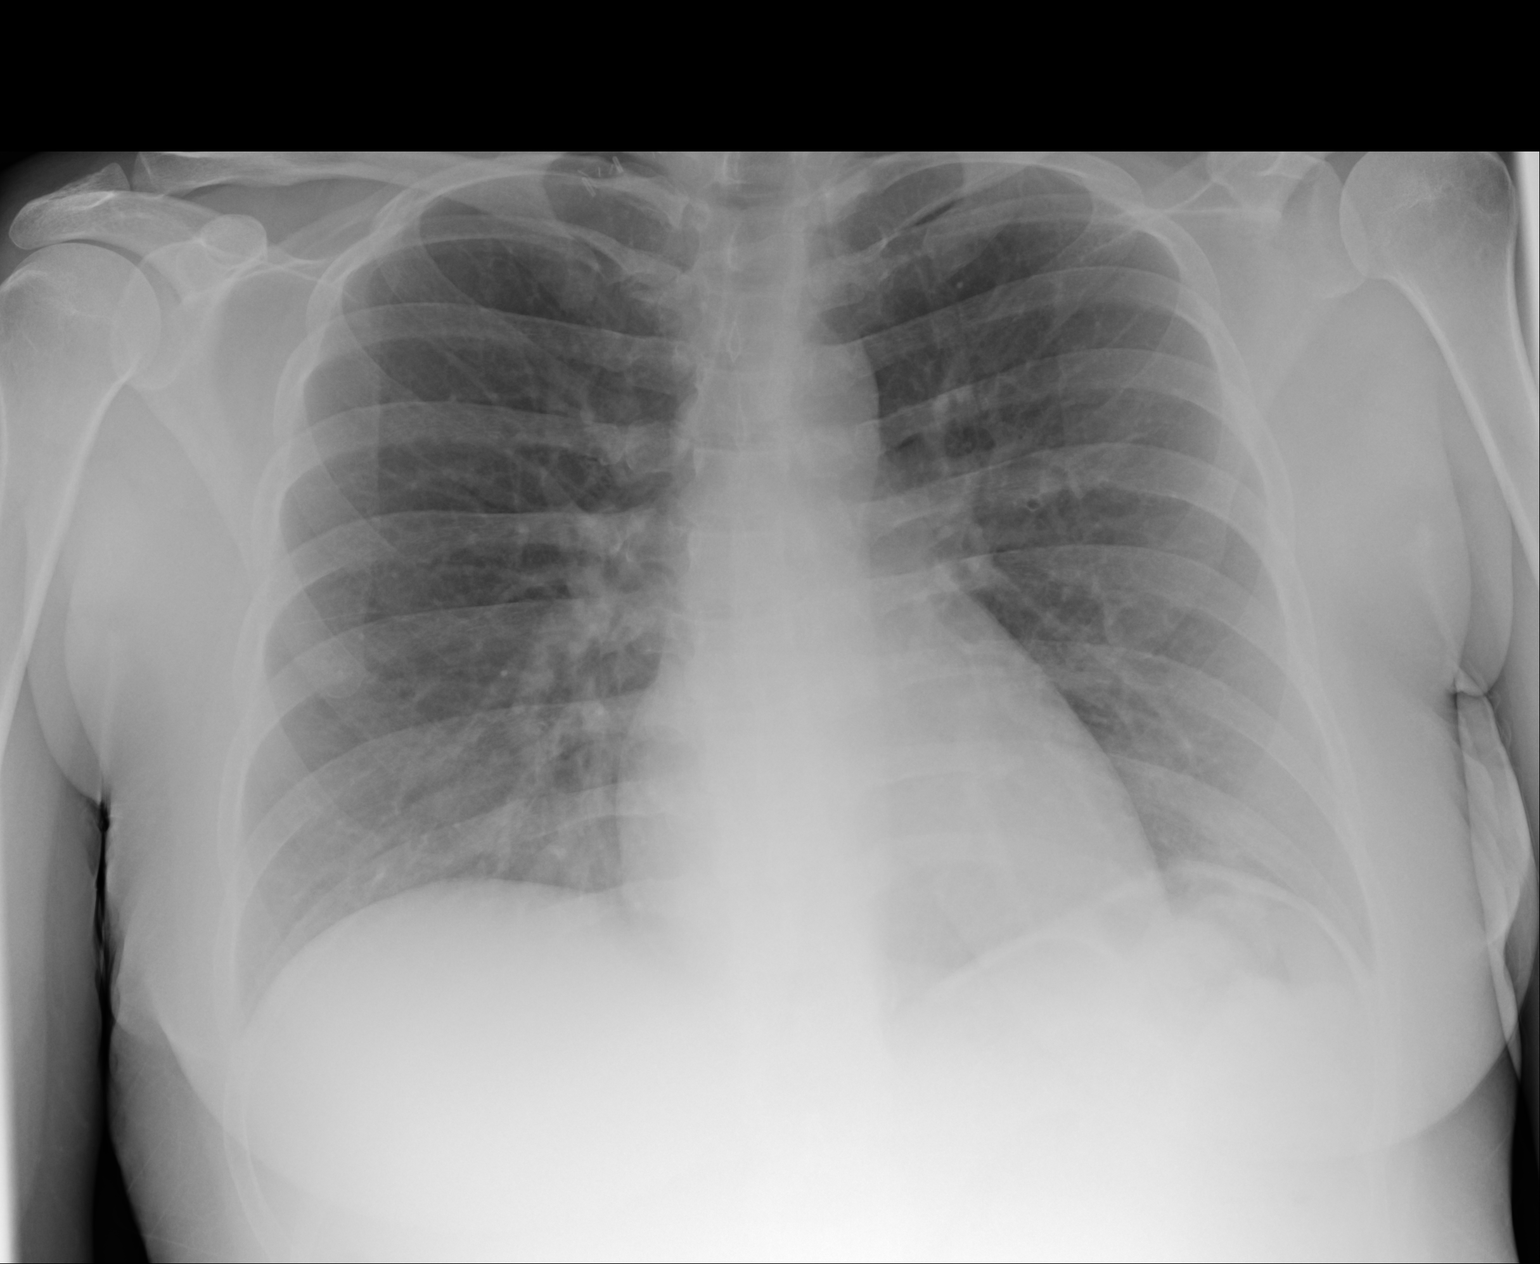

[2 of 2 positions shown; findings below may reference images not displayed]

FINDINGS: The heart size and mediastinal contours are within normal limits.
Both lungs are clear. Increased density over the lower lung zones
favored to be secondary to breast tissue attenuation. The visualized
skeletal structures are unremarkable.
IMPRESSION: No active disease.

## 2022-11-21 ENCOUNTER — Encounter: Payer: Commercial Managed Care - HMO | Admitting: Obstetrics & Gynecology

## 2022-12-19 ENCOUNTER — Encounter: Payer: Commercial Managed Care - HMO | Admitting: Obstetrics & Gynecology

## 2023-10-14 ENCOUNTER — Other Ambulatory Visit: Payer: Self-pay

## 2023-10-14 ENCOUNTER — Emergency Department (HOSPITAL_COMMUNITY): Payer: MEDICAID

## 2023-10-14 ENCOUNTER — Inpatient Hospital Stay (HOSPITAL_COMMUNITY)
Admission: EM | Admit: 2023-10-14 | Discharge: 2023-11-11 | DRG: 871 | Disposition: A | Discharge status: Home / Self Care | Payer: MEDICAID | Attending: Internal Medicine | Admitting: Internal Medicine

## 2023-10-14 ENCOUNTER — Encounter (HOSPITAL_COMMUNITY): Payer: Self-pay

## 2023-10-14 DIAGNOSIS — I10 Essential (primary) hypertension: Secondary | ICD-10-CM | POA: Diagnosis present

## 2023-10-14 DIAGNOSIS — G894 Chronic pain syndrome: Secondary | ICD-10-CM | POA: Diagnosis present

## 2023-10-14 DIAGNOSIS — J9811 Atelectasis: Secondary | ICD-10-CM | POA: Diagnosis not present

## 2023-10-14 DIAGNOSIS — Z813 Family history of other psychoactive substance abuse and dependence: Secondary | ICD-10-CM

## 2023-10-14 DIAGNOSIS — J90 Pleural effusion, not elsewhere classified: Secondary | ICD-10-CM | POA: Diagnosis not present

## 2023-10-14 DIAGNOSIS — F1410 Cocaine abuse, uncomplicated: Secondary | ICD-10-CM | POA: Diagnosis present

## 2023-10-14 DIAGNOSIS — E871 Hypo-osmolality and hyponatremia: Secondary | ICD-10-CM | POA: Diagnosis present

## 2023-10-14 DIAGNOSIS — D638 Anemia in other chronic diseases classified elsewhere: Secondary | ICD-10-CM | POA: Diagnosis present

## 2023-10-14 DIAGNOSIS — Z5982 Transportation insecurity: Secondary | ICD-10-CM

## 2023-10-14 DIAGNOSIS — I76 Septic arterial embolism: Principal | ICD-10-CM

## 2023-10-14 DIAGNOSIS — Z885 Allergy status to narcotic agent status: Secondary | ICD-10-CM

## 2023-10-14 DIAGNOSIS — H6690 Otitis media, unspecified, unspecified ear: Secondary | ICD-10-CM | POA: Diagnosis present

## 2023-10-14 DIAGNOSIS — Z818 Family history of other mental and behavioral disorders: Secondary | ICD-10-CM

## 2023-10-14 DIAGNOSIS — J189 Pneumonia, unspecified organism: Secondary | ICD-10-CM | POA: Diagnosis not present

## 2023-10-14 DIAGNOSIS — F1120 Opioid dependence, uncomplicated: Secondary | ICD-10-CM | POA: Diagnosis present

## 2023-10-14 DIAGNOSIS — R9431 Abnormal electrocardiogram [ECG] [EKG]: Secondary | ICD-10-CM | POA: Diagnosis present

## 2023-10-14 DIAGNOSIS — F17210 Nicotine dependence, cigarettes, uncomplicated: Secondary | ICD-10-CM | POA: Diagnosis present

## 2023-10-14 DIAGNOSIS — Z1152 Encounter for screening for COVID-19: Secondary | ICD-10-CM

## 2023-10-14 DIAGNOSIS — F32A Depression, unspecified: Secondary | ICD-10-CM | POA: Diagnosis present

## 2023-10-14 DIAGNOSIS — Z79899 Other long term (current) drug therapy: Secondary | ICD-10-CM

## 2023-10-14 DIAGNOSIS — R7881 Bacteremia: Secondary | ICD-10-CM | POA: Diagnosis present

## 2023-10-14 DIAGNOSIS — J869 Pyothorax without fistula: Secondary | ICD-10-CM | POA: Diagnosis present

## 2023-10-14 DIAGNOSIS — E876 Hypokalemia: Secondary | ICD-10-CM | POA: Diagnosis present

## 2023-10-14 DIAGNOSIS — A4101 Sepsis due to Methicillin susceptible Staphylococcus aureus: Secondary | ICD-10-CM | POA: Diagnosis not present

## 2023-10-14 DIAGNOSIS — J948 Other specified pleural conditions: Secondary | ICD-10-CM | POA: Diagnosis not present

## 2023-10-14 DIAGNOSIS — F419 Anxiety disorder, unspecified: Secondary | ICD-10-CM | POA: Diagnosis present

## 2023-10-14 DIAGNOSIS — R1110 Vomiting, unspecified: Secondary | ICD-10-CM | POA: Diagnosis not present

## 2023-10-14 DIAGNOSIS — F4310 Post-traumatic stress disorder, unspecified: Secondary | ICD-10-CM | POA: Diagnosis present

## 2023-10-14 DIAGNOSIS — Z811 Family history of alcohol abuse and dependence: Secondary | ICD-10-CM

## 2023-10-14 DIAGNOSIS — D75839 Thrombocytosis, unspecified: Secondary | ICD-10-CM | POA: Diagnosis present

## 2023-10-14 DIAGNOSIS — J850 Gangrene and necrosis of lung: Secondary | ICD-10-CM | POA: Diagnosis present

## 2023-10-14 DIAGNOSIS — Z5986 Financial insecurity: Secondary | ICD-10-CM

## 2023-10-14 DIAGNOSIS — B9561 Methicillin susceptible Staphylococcus aureus infection as the cause of diseases classified elsewhere: Secondary | ICD-10-CM | POA: Diagnosis present

## 2023-10-14 DIAGNOSIS — N179 Acute kidney failure, unspecified: Secondary | ICD-10-CM | POA: Diagnosis present

## 2023-10-14 DIAGNOSIS — B1920 Unspecified viral hepatitis C without hepatic coma: Secondary | ICD-10-CM | POA: Diagnosis present

## 2023-10-14 DIAGNOSIS — F1910 Other psychoactive substance abuse, uncomplicated: Secondary | ICD-10-CM | POA: Diagnosis present

## 2023-10-14 DIAGNOSIS — J15211 Pneumonia due to Methicillin susceptible Staphylococcus aureus: Secondary | ICD-10-CM | POA: Diagnosis present

## 2023-10-14 LAB — BASIC METABOLIC PANEL
Anion gap: 12 (ref 5–15)
BUN: 15 mg/dL (ref 6–20)
CO2: 20 mmol/L — ABNORMAL LOW (ref 22–32)
Calcium: 8.2 mg/dL — ABNORMAL LOW (ref 8.9–10.3)
Chloride: 97 mmol/L — ABNORMAL LOW (ref 98–111)
Creatinine, Ser: 1.01 mg/dL — ABNORMAL HIGH (ref 0.44–1.00)
GFR, Estimated: >60 mL/min (ref >60)
Glucose, Bld: 105 mg/dL — ABNORMAL HIGH (ref 70–99)
Potassium: 2.9 mmol/L — ABNORMAL LOW (ref 3.5–5.1)
Sodium: 129 mmol/L — ABNORMAL LOW (ref 135–145)

## 2023-10-14 LAB — TROPONIN I (HIGH SENSITIVITY)
Troponin I (High Sensitivity): 4 ng/L (ref <18)
Troponin I (High Sensitivity): 4 ng/L (ref <18)

## 2023-10-14 LAB — CBC
HCT: 35.8 % — ABNORMAL LOW (ref 36.0–46.0)
Hemoglobin: 12.1 g/dL (ref 12.0–15.0)
MCH: 29.3 pg (ref 26.0–34.0)
MCHC: 33.8 g/dL (ref 30.0–36.0)
MCV: 86.7 fL (ref 80.0–100.0)
Platelets: 222 10*3/uL (ref 150–400)
RBC: 4.13 MIL/uL (ref 3.87–5.11)
RDW: 12.6 % (ref 11.5–15.5)
WBC: 12.4 10*3/uL — ABNORMAL HIGH (ref 4.0–10.5)
nRBC: 0 % (ref 0.0–0.2)

## 2023-10-14 LAB — HEPATIC FUNCTION PANEL
ALT: 22 U/L (ref 0–44)
AST: 22 U/L (ref 15–41)
Albumin: 3.6 g/dL (ref 3.5–5.0)
Alkaline Phosphatase: 62 U/L (ref 38–126)
Bilirubin, Direct: 0.2 mg/dL (ref 0.0–0.2)
Indirect Bilirubin: 0.7 mg/dL (ref 0.3–0.9)
Total Bilirubin: 0.9 mg/dL (ref 0.0–1.2)
Total Protein: 7.6 g/dL (ref 6.5–8.1)

## 2023-10-14 LAB — RESP PANEL BY RT-PCR (RSV, FLU A&B, COVID)  RVPGX2
Influenza A by PCR: NEGATIVE
Influenza B by PCR: NEGATIVE
Resp Syncytial Virus by PCR: NEGATIVE
SARS Coronavirus 2 by RT PCR: NEGATIVE

## 2023-10-14 LAB — MAGNESIUM: Magnesium: 1.6 mg/dL — ABNORMAL LOW (ref 1.7–2.4)

## 2023-10-14 LAB — LACTIC ACID, PLASMA: Lactic Acid, Venous: 1.2 mmol/L (ref 0.5–1.9)

## 2023-10-14 LAB — POC URINE PREG, ED: Preg Test, Ur: NEGATIVE

## 2023-10-14 MED ORDER — LACTATED RINGERS IV BOLUS
1000.0000 mL | Freq: Once | INTRAVENOUS | Status: AC
  Administered 2023-10-14: 1000 mL via INTRAVENOUS

## 2023-10-14 MED ORDER — IOHEXOL 350 MG/ML SOLN
75.0000 mL | Freq: Once | INTRAVENOUS | Status: AC | PRN
  Administered 2023-10-14: 75 mL via INTRAVENOUS

## 2023-10-14 MED ORDER — SODIUM CHLORIDE 0.9 % IV SOLN
2.0000 g | Freq: Once | INTRAVENOUS | Status: AC
  Administered 2023-10-14: 2 g via INTRAVENOUS
  Filled 2023-10-14: qty 12.5

## 2023-10-14 MED ORDER — VANCOMYCIN HCL 1.5 G IV SOLR
1500.0000 mg | Freq: Once | INTRAVENOUS | Status: AC
  Administered 2023-10-15: 1500 mg via INTRAVENOUS
  Filled 2023-10-14: qty 30

## 2023-10-14 MED ORDER — ACETAMINOPHEN 325 MG PO TABS
650.0000 mg | ORAL_TABLET | Freq: Once | ORAL | Status: AC
  Administered 2023-10-14: 650 mg via ORAL
  Filled 2023-10-14: qty 2

## 2023-10-14 NOTE — ED Triage Notes (Addendum)
 Pt c/o right chest pain, SOB, leg cramping, foot numbness. Hx of IV drug use and concerned she has blood clot.   IV R AC, given 500 bolus. 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 Pt was seen at [facility] yesterday and they stated she was withdrawing so pt states she used fentanyl to see if it would help symptoms and states that it did not.

## 2023-10-14 NOTE — ED Notes (Signed)
 Patient transported to CT

## 2023-10-14 NOTE — Progress Notes (Signed)
 ED Pharmacy Antibiotic Sign Off An antibiotic consult was received from an ED provider for vancomycin per pharmacy dosing for sepsis. A chart review was completed to assess appropriateness.   The following one time order(s) were placed:  Vancomycin 1500mg  IV x1  Further antibiotic and/or antibiotic pharmacy consults should be ordered by the admitting provider if indicated.   Thank you for allowing pharmacy to be a part of this patient's care.   Vernard Gambles, PharmD, BCPS   Clinical Pharmacist 10/14/23 11:05 PM

## 2023-10-15 ENCOUNTER — Inpatient Hospital Stay (HOSPITAL_COMMUNITY): Payer: MEDICAID

## 2023-10-15 ENCOUNTER — Encounter (HOSPITAL_COMMUNITY): Payer: Self-pay | Admitting: Family Medicine

## 2023-10-15 DIAGNOSIS — R9431 Abnormal electrocardiogram [ECG] [EKG]: Secondary | ICD-10-CM

## 2023-10-15 DIAGNOSIS — J9811 Atelectasis: Secondary | ICD-10-CM | POA: Diagnosis not present

## 2023-10-15 DIAGNOSIS — J948 Other specified pleural conditions: Secondary | ICD-10-CM | POA: Diagnosis not present

## 2023-10-15 DIAGNOSIS — Z818 Family history of other mental and behavioral disorders: Secondary | ICD-10-CM | POA: Diagnosis not present

## 2023-10-15 DIAGNOSIS — R7881 Bacteremia: Secondary | ICD-10-CM | POA: Diagnosis not present

## 2023-10-15 DIAGNOSIS — R9430 Abnormal result of cardiovascular function study, unspecified: Secondary | ICD-10-CM | POA: Diagnosis not present

## 2023-10-15 DIAGNOSIS — D75839 Thrombocytosis, unspecified: Secondary | ICD-10-CM | POA: Diagnosis present

## 2023-10-15 DIAGNOSIS — R0602 Shortness of breath: Secondary | ICD-10-CM

## 2023-10-15 DIAGNOSIS — D638 Anemia in other chronic diseases classified elsewhere: Secondary | ICD-10-CM | POA: Diagnosis present

## 2023-10-15 DIAGNOSIS — B1920 Unspecified viral hepatitis C without hepatic coma: Secondary | ICD-10-CM | POA: Diagnosis present

## 2023-10-15 DIAGNOSIS — Z813 Family history of other psychoactive substance abuse and dependence: Secondary | ICD-10-CM | POA: Diagnosis not present

## 2023-10-15 DIAGNOSIS — J189 Pneumonia, unspecified organism: Secondary | ICD-10-CM

## 2023-10-15 DIAGNOSIS — J869 Pyothorax without fistula: Secondary | ICD-10-CM | POA: Diagnosis present

## 2023-10-15 DIAGNOSIS — E871 Hypo-osmolality and hyponatremia: Secondary | ICD-10-CM | POA: Diagnosis present

## 2023-10-15 DIAGNOSIS — J90 Pleural effusion, not elsewhere classified: Secondary | ICD-10-CM | POA: Diagnosis not present

## 2023-10-15 DIAGNOSIS — E876 Hypokalemia: Secondary | ICD-10-CM | POA: Diagnosis present

## 2023-10-15 DIAGNOSIS — F1910 Other psychoactive substance abuse, uncomplicated: Secondary | ICD-10-CM | POA: Diagnosis not present

## 2023-10-15 DIAGNOSIS — F1120 Opioid dependence, uncomplicated: Secondary | ICD-10-CM | POA: Diagnosis present

## 2023-10-15 DIAGNOSIS — H6690 Otitis media, unspecified, unspecified ear: Secondary | ICD-10-CM | POA: Diagnosis present

## 2023-10-15 DIAGNOSIS — Z1152 Encounter for screening for COVID-19: Secondary | ICD-10-CM | POA: Diagnosis not present

## 2023-10-15 DIAGNOSIS — N179 Acute kidney failure, unspecified: Secondary | ICD-10-CM | POA: Diagnosis present

## 2023-10-15 DIAGNOSIS — F32A Depression, unspecified: Secondary | ICD-10-CM | POA: Diagnosis present

## 2023-10-15 DIAGNOSIS — G894 Chronic pain syndrome: Secondary | ICD-10-CM | POA: Diagnosis present

## 2023-10-15 DIAGNOSIS — F4310 Post-traumatic stress disorder, unspecified: Secondary | ICD-10-CM | POA: Diagnosis present

## 2023-10-15 DIAGNOSIS — I76 Septic arterial embolism: Secondary | ICD-10-CM | POA: Diagnosis not present

## 2023-10-15 DIAGNOSIS — F418 Other specified anxiety disorders: Secondary | ICD-10-CM | POA: Diagnosis not present

## 2023-10-15 DIAGNOSIS — A4101 Sepsis due to Methicillin susceptible Staphylococcus aureus: Secondary | ICD-10-CM | POA: Diagnosis present

## 2023-10-15 DIAGNOSIS — I10 Essential (primary) hypertension: Secondary | ICD-10-CM | POA: Diagnosis present

## 2023-10-15 DIAGNOSIS — J15211 Pneumonia due to Methicillin susceptible Staphylococcus aureus: Secondary | ICD-10-CM | POA: Diagnosis present

## 2023-10-15 DIAGNOSIS — F1410 Cocaine abuse, uncomplicated: Secondary | ICD-10-CM | POA: Diagnosis present

## 2023-10-15 DIAGNOSIS — F17210 Nicotine dependence, cigarettes, uncomplicated: Secondary | ICD-10-CM | POA: Diagnosis present

## 2023-10-15 LAB — BASIC METABOLIC PANEL
Anion gap: 8 (ref 5–15)
Anion gap: 8 (ref 5–15)
BUN: 14 mg/dL (ref 6–20)
BUN: 12 mg/dL (ref 6–20)
CO2: 24 mmol/L (ref 22–32)
CO2: 23 mmol/L (ref 22–32)
Calcium: 8 mg/dL — ABNORMAL LOW (ref 8.9–10.3)
Calcium: 8.1 mg/dL — ABNORMAL LOW (ref 8.9–10.3)
Chloride: 102 mmol/L (ref 98–111)
Chloride: 101 mmol/L (ref 98–111)
Creatinine, Ser: 0.77 mg/dL (ref 0.44–1.00)
Creatinine, Ser: 0.59 mg/dL (ref 0.44–1.00)
GFR, Estimated: >60 mL/min (ref >60)
GFR, Estimated: >60 mL/min (ref >60)
Glucose, Bld: 114 mg/dL — ABNORMAL HIGH (ref 70–99)
Glucose, Bld: 109 mg/dL — ABNORMAL HIGH (ref 70–99)
Potassium: 2.5 mmol/L — CL (ref 3.5–5.1)
Potassium: 4.2 mmol/L (ref 3.5–5.1)
Sodium: 134 mmol/L — ABNORMAL LOW (ref 135–145)
Sodium: 132 mmol/L — ABNORMAL LOW (ref 135–145)

## 2023-10-15 LAB — BLOOD CULTURE ID PANEL (REFLEXED) - BCID2

## 2023-10-15 LAB — CBC
HCT: 31.1 % — ABNORMAL LOW (ref 36.0–46.0)
Hemoglobin: 10.3 g/dL — ABNORMAL LOW (ref 12.0–15.0)
MCH: 29.3 pg (ref 26.0–34.0)
MCHC: 33.1 g/dL (ref 30.0–36.0)
MCV: 88.6 fL (ref 80.0–100.0)
Platelets: 191 10*3/uL (ref 150–400)
RBC: 3.51 MIL/uL — ABNORMAL LOW (ref 3.87–5.11)
RDW: 12.6 % (ref 11.5–15.5)
WBC: 11 10*3/uL — ABNORMAL HIGH (ref 4.0–10.5)
nRBC: 0 % (ref 0.0–0.2)

## 2023-10-15 LAB — ECHOCARDIOGRAM COMPLETE
Area-P 1/2: 3.77 cm2
Height: 67 in
S' Lateral: 3.4 cm
Weight: 2880 [oz_av]

## 2023-10-15 LAB — HIV ANTIBODY (ROUTINE TESTING W REFLEX): HIV Screen 4th Generation wRfx: NONREACTIVE

## 2023-10-15 MED ORDER — POTASSIUM CHLORIDE CRYS ER 20 MEQ PO TBCR
40.0000 meq | EXTENDED_RELEASE_TABLET | Freq: Once | ORAL | Status: AC
  Administered 2023-10-15: 40 meq via ORAL
  Filled 2023-10-15: qty 2

## 2023-10-15 MED ORDER — SENNOSIDES-DOCUSATE SODIUM 8.6-50 MG PO TABS
1.0000 | ORAL_TABLET | Freq: Every evening | ORAL | Status: DC | PRN
  Administered 2023-10-23: 1 via ORAL
  Filled 2023-10-15: qty 1

## 2023-10-15 MED ORDER — TRIMETHOBENZAMIDE HCL 100 MG/ML IM SOLN: 200.0000 mg | Freq: Four times a day (QID) | INTRAMUSCULAR | Status: DC | PRN

## 2023-10-15 MED ORDER — NICOTINE 21 MG/24HR TD PT24
21.0000 mg | MEDICATED_PATCH | Freq: Every day | TRANSDERMAL | Status: DC
  Administered 2023-10-15 – 2023-11-11 (×24): 21 mg via TRANSDERMAL
  Filled 2023-10-15 (×28): qty 1

## 2023-10-15 MED ORDER — ACETAMINOPHEN 650 MG RE SUPP: 650.0000 mg | Freq: Four times a day (QID) | RECTAL | Status: DC | PRN

## 2023-10-15 MED ORDER — METHADONE HCL 10 MG PO TABS
40.0000 mg | ORAL_TABLET | Freq: Every day | ORAL | Status: DC
  Administered 2023-10-15 – 2023-11-11 (×28): 40 mg via ORAL
  Filled 2023-10-15 (×29): qty 4

## 2023-10-15 MED ORDER — SODIUM CHLORIDE 0.9 % IV SOLN: INTRAVENOUS | Status: AC

## 2023-10-15 MED ORDER — MAGNESIUM SULFATE 2 GM/50ML IV SOLN
2.0000 g | Freq: Once | INTRAVENOUS | Status: AC
  Administered 2023-10-15: 2 g via INTRAVENOUS
  Filled 2023-10-15: qty 50

## 2023-10-15 MED ORDER — SODIUM CHLORIDE 0.9% FLUSH
3.0000 mL | Freq: Two times a day (BID) | INTRAVENOUS | Status: DC
  Administered 2023-10-15 – 2023-11-11 (×42): 3 mL via INTRAVENOUS

## 2023-10-15 MED ORDER — POTASSIUM CHLORIDE 10 MEQ/100ML IV SOLN
10.0000 meq | INTRAVENOUS | Status: AC
  Administered 2023-10-15 (×2): 10 meq via INTRAVENOUS
  Filled 2023-10-15 (×2): qty 100

## 2023-10-15 MED ORDER — PNEUMOCOCCAL 20-VAL CONJ VACC 0.5 ML IM SUSY: 0.5000 mL | PREFILLED_SYRINGE | INTRAMUSCULAR | Status: DC

## 2023-10-15 MED ORDER — OXYCODONE HCL 5 MG PO TABS
5.0000 mg | ORAL_TABLET | ORAL | Status: DC | PRN
  Administered 2023-10-15: 5 mg via ORAL
  Filled 2023-10-15: qty 1

## 2023-10-15 MED ORDER — SODIUM CHLORIDE 0.9 % IV SOLN
100.0000 mg | Freq: Two times a day (BID) | INTRAVENOUS | Status: DC
  Administered 2023-10-15 – 2023-10-16 (×3): 100 mg via INTRAVENOUS
  Filled 2023-10-15 (×6): qty 100

## 2023-10-15 MED ORDER — POTASSIUM CHLORIDE CRYS ER 20 MEQ PO TBCR
40.0000 meq | EXTENDED_RELEASE_TABLET | ORAL | Status: AC
  Administered 2023-10-15 (×3): 40 meq via ORAL
  Filled 2023-10-15 (×3): qty 2

## 2023-10-15 MED ORDER — INFLUENZA VIRUS VACC SPLIT PF (FLUZONE) 0.5 ML IM SUSY: 0.5000 mL | PREFILLED_SYRINGE | INTRAMUSCULAR | Status: DC

## 2023-10-15 MED ORDER — ACETAMINOPHEN 325 MG PO TABS
650.0000 mg | ORAL_TABLET | Freq: Four times a day (QID) | ORAL | Status: DC | PRN
  Administered 2023-10-15 – 2023-10-18 (×3): 650 mg via ORAL
  Filled 2023-10-15 (×3): qty 2

## 2023-10-15 MED ORDER — PANTOPRAZOLE SODIUM 40 MG PO TBEC
40.0000 mg | DELAYED_RELEASE_TABLET | Freq: Every day | ORAL | Status: DC
  Administered 2023-10-15 – 2023-11-11 (×28): 40 mg via ORAL
  Filled 2023-10-15 (×28): qty 1

## 2023-10-15 MED ORDER — KETOROLAC TROMETHAMINE 15 MG/ML IJ SOLN
15.0000 mg | Freq: Four times a day (QID) | INTRAMUSCULAR | Status: DC | PRN
  Administered 2023-10-15 – 2023-10-17 (×8): 15 mg via INTRAVENOUS
  Filled 2023-10-15 (×8): qty 1

## 2023-10-15 MED ORDER — ENOXAPARIN SODIUM 40 MG/0.4ML IJ SOSY
40.0000 mg | PREFILLED_SYRINGE | INTRAMUSCULAR | Status: DC
  Administered 2023-10-15 – 2023-11-08 (×25): 40 mg via SUBCUTANEOUS
  Filled 2023-10-15 (×28): qty 0.4

## 2023-10-15 MED ORDER — OXYCODONE HCL 5 MG PO TABS
5.0000 mg | ORAL_TABLET | ORAL | Status: DC | PRN
  Administered 2023-10-15 (×2): 5 mg via ORAL
  Administered 2023-10-15 – 2023-10-16 (×2): 10 mg via ORAL
  Administered 2023-10-16: 5 mg via ORAL
  Administered 2023-10-16 – 2023-10-19 (×13): 10 mg via ORAL
  Filled 2023-10-15 (×3): qty 2
  Filled 2023-10-15: qty 1
  Filled 2023-10-15 (×15): qty 2

## 2023-10-15 MED ORDER — POTASSIUM CHLORIDE 10 MEQ/100ML IV SOLN
INTRAVENOUS | Status: AC
Start: 2023-10-15 — End: 2023-10-15
  Administered 2023-10-15: 10 meq via INTRAVENOUS
  Filled 2023-10-15: qty 100

## 2023-10-15 MED ORDER — VANCOMYCIN HCL IN DEXTROSE 1-5 GM/200ML-% IV SOLN
1000.0000 mg | Freq: Two times a day (BID) | INTRAVENOUS | Status: DC
  Administered 2023-10-15 (×2): 1000 mg via INTRAVENOUS
  Filled 2023-10-15 (×3): qty 200

## 2023-10-15 MED ORDER — SODIUM CHLORIDE 0.9 % IV SOLN
2.0000 g | INTRAVENOUS | Status: DC
  Administered 2023-10-15 – 2023-10-16 (×2): 2 g via INTRAVENOUS
  Filled 2023-10-15 (×2): qty 20

## 2023-10-15 NOTE — Consult Note (Signed)
 [facility]                                                                                        Patient Identification: Patient Name: Pam Soto MRN: [id number] Admit Date: 10/14/2023  8:54 PM Today's Date: 10/15/2023 Reason for consult: Bacteremia  Requesting provider: Dr Rennis Chris  Principal Problem:   Pneumonia Active Problems:   Hyponatremia   Hypokalemia   Hypomagnesemia   Prolonged QT interval   Substance abuse (HCC)   Multifocal pneumonia  Antibiotics:  Vancomycin 3/4-c Cefepime 3/4, ceftriaxone 3/5 Doxycycline 3/4  Lines/Hardware:  Assessment # GPC bacteremia # RLL PNA, possible septic emboli, post viral pna -not identified yet  # IVDU # Opioid use d/o - active   Recommendations  - continue Vancomycin, pharmacy to dose for GPC bacteremia - continue ceftriaxone and doxycycline for CAP coverage until ID of GPC - 2 sets of blood cultures ordered for tomorrow  - Fu TTE, ID of GPC - Fu MRSA PCR, Strep pneumo ag, legionella ag, sputum culture - Hep B and C serology ordered  - Universal/standard isolation precautions  Rest of the management as per the primary team. Please call with questions or concerns.  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 Denies rashes, painful or swollen joints. She has flu last week and had persistent symptoms with worsening chills. Seen in th [facility] a day before prior to ED and was told she was withdrawing and discharged. Last IVD was a day prior to ED arrival  At ED, febrile, tachycardic, tachypnea, track marks over multiple extremities Lab work remarkable for WBC elevated  to 12.4, k 2.9, Na 129, Mg 1.6, Cr 1.01, LA normal, negative Flu, RSV, SARScov2 PCR Started on IVF and broad spectrum abtx after blood cx due to sepsis  CTAPE Mild multifocal infiltrates are seen with within the right lower lobe  ROS: unavailable as remote chart review only   Past Medical History:  Diagnosis Date   Allergy    Anemia    Cough 05/04/2015   Depression    Non-restorable tooth 04/2015   teeth   PTSD (post-traumatic stress disorder)    Substance abuse (HCC)    Swelling of gums 04/2015   Past Surgical History:  Procedure Laterality Date   MULTIPLE EXTRACTIONS WITH ALVEOLOPLASTY N/A 05/08/2015   Procedure: MULTIPLE TEETH EXTRACTIONS WITH ALVEOLOPLASTY;  Surgeon: Ocie Doyne, DDS;  Location: [facility];  Service: Oral Surgery;  Laterality: N/A;   TONSILLECTOMY     TONSILLECTOMY AND ADENOIDECTOMY  02/19/2001   Scheduled Meds:  enoxaparin (LOVENOX) injection  40 mg Subcutaneous Q24H   pantoprazole  40 mg Oral Daily   potassium chloride  40 mEq Oral Q4H   sodium chloride flush  3 mL Intravenous Q12H   Continuous Infusions:  cefTRIAXone (ROCEPHIN)  IV Stopped (10/15/23 1012)   doxycycline (VIBRAMYCIN) IV Stopped (10/15/23 0743)   vancomycin Stopped (10/15/23 1248)   PRN  Meds:.acetaminophen **OR** acetaminophen, ketorolac, oxyCODONE, senna-docusate, trimethobenzamide  Allergies  Allergen Reactions   Tramadol Hives   Social History   Socioeconomic History   Marital status: Single    Spouse name: Not on file   Number of children: Not on file   Years of education: Not on file   Highest education level: Not on file  Occupational History   Not on file  Tobacco Use   Smoking status: Every Day    Current packs/day: 1.00    Average packs/day: 1 pack/day for 5.0 years (5.0 ttl pk-yrs)    Types: Cigarettes   Smokeless tobacco: Never  Vaping Use   Vaping status: Never Used  Substance and Sexual Activity   Alcohol use: No   Drug use: Not Currently     Types: Cocaine    Comment: percocet   Sexual activity: Yes    Birth control/protection: None  Other Topics Concern   Not on file  Social History Narrative   Not on file   Social Drivers of Health   Financial Resource Strain: Medium Risk (02/18/2023)   Received from [facility]   Overall Financial Resource Strain (CARDIA)    Difficulty of Paying Living Expenses: Somewhat hard  Food Insecurity: No Food Insecurity (10/15/2023)   Hunger Vital Sign    Worried About Running Out of Food in the Last Year: Never true    Ran Out of Food in the Last Year: Never true  Transportation Needs: Unmet Transportation Needs (10/15/2023)   PRAPARE - Administrator, Civil Service (Medical): Yes    Lack of Transportation (Non-Medical): Yes  Physical Activity: Not on file  Stress: Not on file  Social Connections: Moderately Integrated (10/15/2023)   Social Connection and Isolation Panel [NHANES]    Frequency of Communication with Friends and Family: Three times a week    Frequency of Social Gatherings with Friends and Family: Three times a week    Attends Religious Services: 1 to 4 times per year    Active Member of Clubs or Organizations: No    Attends Banker Meetings: Never    Marital Status: Living with partner  Intimate Partner Violence: Not At Risk (10/15/2023)   Humiliation, Afraid, Rape, and Kick questionnaire    Fear of Current or Ex-Partner: No    Emotionally Abused: No    Physically Abused: No    Sexually Abused: No   Family History  Problem Relation Age of Onset   Depression Mother    Drug abuse Mother    Alcohol abuse Father    Cirrhosis Father    Vitals BP 113/73 (BP Location: Right Arm)   Pulse 91   Temp 99.3 F (37.4 C) (Oral)   Resp 16   Ht 5\' 7"  (1.702 m)   Wt 81.6 kg   SpO2 98%   BMI 28.19 kg/m    Physical Exam Remote consult, not seen in person  Pertinent Microbiology Results for orders placed or performed during the hospital encounter of  10/14/23  Resp panel by RT-PCR (RSV, Flu A&B, Covid) Anterior Nasal Swab     Status: None   Collection Time: 10/14/23  8:56 PM   Specimen: Anterior Nasal Swab  Result Value Ref Range Status   SARS Coronavirus 2 by RT PCR NEGATIVE NEGATIVE Final    Comment: (NOTE) SARS-CoV-2 target nucleic acids are NOT DETECTED.  The SARS-CoV-2 RNA is generally detectable in upper respiratory specimens during the acute phase of infection. The lowest concentration  of SARS-CoV-2 viral copies this assay can detect is 138 copies/mL. A negative result does not preclude SARS-Cov-2 infection and should not be used as the sole basis for treatment or other patient management decisions. A negative result may occur with  improper specimen collection/handling, submission of specimen other than nasopharyngeal swab, presence of viral mutation(s) within the areas targeted by this assay, and inadequate number of viral copies(<138 copies/mL). A negative result must be combined with clinical observations, patient history, and epidemiological information. The expected result is Negative.  Fact Sheet for Patients:  BloggerCourse.com  Fact Sheet for Healthcare Providers:  SeriousBroker.it  This test is no t yet approved or cleared by the [place] FDA and  has been authorized for detection and/or diagnosis of SARS-CoV-2 by FDA under an Emergency Use Authorization (EUA). This EUA will remain  in effect (meaning this test can be used) for the duration of the COVID-19 declaration under Section 564(b)(1) of the Act, 21 U.S.C.section 360bbb-3(b)(1), unless the authorization is terminated  or revoked sooner.       Influenza A by PCR NEGATIVE NEGATIVE Final   Influenza B by PCR NEGATIVE NEGATIVE Final    Comment: (NOTE) The Xpert Xpress SARS-CoV-2/FLU/RSV plus assay is intended as an aid in the diagnosis of influenza from Nasopharyngeal swab specimens and should not  be used as a sole basis for treatment. Nasal washings and aspirates are unacceptable for Xpert Xpress SARS-CoV-2/FLU/RSV testing.  Fact Sheet for Patients: BloggerCourse.com  Fact Sheet for Healthcare Providers: SeriousBroker.it  This test is not yet approved or cleared by the [place] FDA and has been authorized for detection and/or diagnosis of SARS-CoV-2 by FDA under an Emergency Use Authorization (EUA). This EUA will remain in effect (meaning this test can be used) for the duration of the COVID-19 declaration under Section 564(b)(1) of the Act, 21 U.S.C. section 360bbb-3(b)(1), unless the authorization is terminated or revoked.     Resp Syncytial Virus by PCR NEGATIVE NEGATIVE Final    Comment: (NOTE) Fact Sheet for Patients: BloggerCourse.com  Fact Sheet for Healthcare Providers: SeriousBroker.it  This test is not yet approved or cleared by the [place] FDA and has been authorized for detection and/or diagnosis of SARS-CoV-2 by FDA under an Emergency Use Authorization (EUA). This EUA will remain in effect (meaning this test can be used) for the duration of the COVID-19 declaration under Section 564(b)(1) of the Act, 21 U.S.C. section 360bbb-3(b)(1), unless the authorization is terminated or revoked.  Performed at [facility], [address] [place], [place] [address]   Blood culture (routine x 2)     Status: None (Preliminary result)   Collection Time: 10/14/23 11:03 PM   Specimen: BLOOD  Result Value Ref Range Status   Specimen Description BLOOD BLOOD RIGHT ARM  Final   Special Requests   Final    BOTTLES DRAWN AEROBIC AND ANAEROBIC Blood Culture adequate volume   Culture  Setup Time   Final    GRAM POSITIVE COCCI ANAEROBIC BOTTLE ONLY Gram Stain Report Called to,Read Back By and Verified With: Wilburt Finlay, RN AT 1123 10/15/23 BY A. SNYDER AEROBIC BOTTLE  ONLY GRAM POSITIVE COCCI Performed at [facility], [address] [place], [place] [address]    Culture [facility] POSITIVE COCCI  Final   Report Status PENDING  Incomplete  Blood culture (routine x 2)     Status: None (Preliminary result)   Collection Time: 10/14/23 11:06 PM   Specimen: BLOOD  Result Value Ref Range Status  Specimen Description BLOOD BLOOD RIGHT ARM  Final   Special Requests   Final    BOTTLES DRAWN AEROBIC AND ANAEROBIC Blood Culture adequate volume   Culture  Setup Time   Final    GRAM POSITIVE COCCI ANAEROBIC BOTTLE ONLY Gram Stain Report Called to,Read Back By and Verified With: Wilburt Finlay, RN AT 1123 10/15/23 BY A. SNYDER AEROBIC BOTTLE ONLY GRAM POSITIVE COCCI Performed at [facility], [address] [place], [place] [address]    Culture [facility] POSITIVE COCCI  Final   Report Status PENDING  Incomplete  Culture, blood (single) w Reflex to ID Panel     Status: None (Preliminary result)   Collection Time: 10/15/23  5:41 AM   Specimen: BLOOD  Result Value Ref Range Status   Specimen Description BLOOD BLOOD RIGHT HAND  Final   Special Requests   Final    BOTTLES DRAWN AEROBIC ONLY Blood Culture adequate volume   Culture   Final    NO GROWTH <12 HOURS Performed at [facility], [address], [place], [place] [address]    Report Status PENDING  Incomplete   Pertinent Lab seen by me:    Latest Ref Rng & Units 10/15/2023    5:19 AM 10/14/2023    9:38 PM 01/13/2019   11:41 PM  CBC  WBC 4.0 - 10.5 K/uL 11.0  12.4  6.4   Hemoglobin 12.0 - 15.0 g/dL 28.4  13.2  44.0   Hematocrit 36.0 - 46.0 % 31.1  35.8  35.1   Platelets 150 - 400 K/uL 191  222  293       Latest Ref Rng & Units 10/15/2023    5:19 AM 10/14/2023    9:38 PM 01/13/2019   11:41 PM  CMP  Glucose 70 - 99 mg/dL 102  725  366   BUN 6 - 20 mg/dL 14  15  11    Creatinine 0.44 - 1.00 mg/dL 4.40  3.47  4.25   Sodium 135 - 145 mmol/L 134  129  138   Potassium 3.5 - 5.1 mmol/L 2.5  2.9  3.7   Chloride 98 - 111  mmol/L 102  97  107   CO2 22 - 32 mmol/L 24  20  24    Calcium 8.9 - 10.3 mg/dL 8.0  8.2  8.4   Total Protein 6.5 - 8.1 g/dL  7.6  6.1   Total Bilirubin 0.0 - 1.2 mg/dL  0.9  0.2   Alkaline Phos 38 - 126 U/L  62  60   AST 15 - 41 U/L  22  35   ALT 0 - 44 U/L  22  41      Pertinent Imagings/Other Imagings Plain films and CT images have been personally visualized and interpreted; radiology reports have been reviewed. Decision making incorporated into the Impression / Recommendations.  CT Angio Chest PE W and/or Wo Contrast Addendum Date: 10/15/2023 ADDENDUM REPORT: 10/15/2023 01:21 ADDENDUM: Upon further evaluation and receipt of additional patient history, the previously noted multifocal infiltrates seen within the right lobe likely represents sequelae associated with septic emboli. Electronically Signed   By: Aram Candela M.D.   On: 10/15/2023 01:21   Result Date: 10/15/2023 CLINICAL DATA:  Right-sided chest pain with shortness of breath and leg cramping. EXAM: CT ANGIOGRAPHY CHEST WITH CONTRAST TECHNIQUE: Multidetector CT imaging of the chest was performed using the standard protocol during bolus administration of intravenous contrast. Multiplanar CT image reconstructions and MIPs were obtained to evaluate the  vascular anatomy. RADIATION DOSE REDUCTION: This exam was performed according to the departmental dose-optimization program which includes automated exposure control, adjustment of the mA and/or kV according to patient size and/or use of iterative reconstruction technique. CONTRAST:  75mL OMNIPAQUE IOHEXOL 350 MG/ML SOLN COMPARISON:  None Available. FINDINGS: Cardiovascular: The thoracic aorta is normal in appearance. Satisfactory opacification of the pulmonary arteries to the segmental level. No evidence of pulmonary embolism. Normal heart size. No pericardial effusion. Mediastinum/Nodes: Mild right hilar lymphadenopathy is suspected. Thyroid gland, trachea, and esophagus demonstrate no  significant findings. Lungs/Pleura: Mild multifocal infiltrates are seen with within the right lower lobe 0. No pleural effusion or pneumothorax is identified. Upper Abdomen: No acute abnormality. Musculoskeletal: No chest wall abnormality. No acute or significant osseous findings. Review of the MIP images confirms the above findings. IMPRESSION: 1. No evidence of pulmonary embolism. 2. Mild multifocal right lower lobe infiltrates. Electronically Signed: By: Aram Candela M.D. On: 10/15/2023 00:56   DG Chest 2 View Result Date: 10/14/2023 CLINICAL DATA:  Chest pain and shortness of breath EXAM: CHEST - 2 VIEW COMPARISON:  01/18/2023 FINDINGS: Cardiac shadow is within normal limits. Lungs are well aerated bilaterally. Mild right lower lobe opacity is noted anteriorly consistent with early infiltrate. No effusion is seen. No bony abnormality is noted. IMPRESSION: Mild right lower lobe infiltrate. Electronically Signed   By: Alcide Clever M.D.   On: 10/14/2023 22:58    I have personally spent 80 minutes involved in face-to-face and non-face-to-face activities for this patient on the day of the visit. Professional time spent includes the following activities: Preparing to see the patient (review of tests), Obtaining and/or reviewing separately obtained history (admission/discharge record), Performing a medically appropriate examination and/or evaluation , Ordering medications/tests/procedures, referring and communicating with other health care professionals, Documenting clinical information in the EMR, Independently interpreting results (not separately reported), Communicating results to the patient/family/caregiver, Counseling and educating the patient/family/caregiver and Care coordination (not separately reported).  Electronically signed by:   Plan d/w requesting provider as well as ID pharm D  Of note, assessment and plan is based on chart review only as patient was not seen in person.  Portions of this  note may have been created with voice recognition software. While this note has been edited for accuracy, occasional wrong-word or 'sound-a-like' substitutions may have occurred due to the inherent limitations of voice recognition software.   Odette Fraction, MD Infectious Disease Physician [facility] Pager: [phone]

## 2023-10-15 NOTE — H&P (Addendum)
 History and Physical    Pam Soto UEA:[id number] DOB: 30-Nov-1990 DOA: 10/14/2023  PCP: Mechele Claude, MD   Patient coming from: Home   Chief Complaint: Chest pain, SOB  HPI: Pam Soto is a 33 y.o. female with medical history significant for IV drug abuse who presents with right-sided chest pain and shortness of breath.  Patient reports developing muscle cramps a few days ago; she reports experiencing this previously when her electrolytes were depleted.  She was otherwise doing well until yesterday when she developed pleuritic pain in her right upper chest and back with nonproductive cough and shortness of breath.  The pleuritic pain has become severe.  She denies any rash or red/hot/swollen joints.    She has been trying to abstain from illicit substances, has been taking methadone per her report, but was concerned that her musculoskeletal pain was a result of withdrawal, and so she injected fentanyl yesterday.  ED Course: Upon arrival to the ED, patient is found to be febrile to 38.8 C and saturating mid 90s on room air with mild tachypnea, tachycardia, and stable blood pressure.  Labs are most notable for sodium 129, potassium 2.9, magnesium 1.6, normal LFTs, WBC 12,400, normal lactic acid, normal troponin x 2, and negative respiratory virus panel.  Blood cultures were collected in the ED and the patient was given a liter of LR, cefepime, vancomycin, and acetaminophen.  Review of Systems:  All other systems reviewed and apart from HPI, are negative.  Past Medical History:  Diagnosis Date   Allergy    Anemia    Cough 05/04/2015   Depression    Non-restorable tooth 04/2015   teeth   PTSD (post-traumatic stress disorder)    Substance abuse (HCC)    Swelling of gums 04/2015    Past Surgical History:  Procedure Laterality Date   MULTIPLE EXTRACTIONS WITH ALVEOLOPLASTY N/A 05/08/2015   Procedure: MULTIPLE TEETH EXTRACTIONS WITH ALVEOLOPLASTY;  Surgeon: Ocie Doyne, DDS;   Location: [facility];  Service: Oral Surgery;  Laterality: N/A;   TONSILLECTOMY     TONSILLECTOMY AND ADENOIDECTOMY  02/19/2001    Social History:   reports that she has been smoking cigarettes. She has a 5 pack-year smoking history. She has never used smokeless tobacco. She reports that she does not currently use drugs after having used the following drugs: Cocaine. She reports that she does not drink alcohol.  Allergies  Allergen Reactions   Tramadol Hives    Family History  Problem Relation Age of Onset   Depression Mother    Drug abuse Mother    Alcohol abuse Father    Cirrhosis Father      Prior to Admission medications   Medication Sig Start Date End Date Taking? Authorizing Provider  Buprenorphine HCl-Naloxone HCl 4-1 MG FILM Place 1 Film under the tongue daily.    [provider]  Prenatal Vit-Fe Fumarate-FA (MULTIVITAMIN-PRENATAL) 27-0.8 MG TABS tablet Take 1 tablet by mouth daily at 12 noon.    [provider]    Physical Exam: Vitals:   10/15/23 0000 10/15/23 0045 10/15/23 0115 10/15/23 0145  BP: 103/73 109/69 98/62 114/71  Pulse: 95 83 83 78  Resp: 18 20 19 18   Temp:  98.9 F (37.2 C)    TempSrc:      SpO2: 98% 97% 97% 96%  Weight:      Height:        Constitutional: NAD, no diaphoresis   Eyes: PERTLA, lids and conjunctivae normal  ENMT: Mucous membranes are moist. Posterior pharynx clear of any exudate or lesions.   Neck: supple, no masses  Respiratory: No wheezing, no crackles. No accessory muscle use.  Cardiovascular: Rate ~100 and regular. No extremity edema.  Abdomen: no tenderness, soft. Bowel sounds active.  Musculoskeletal: no clubbing / cyanosis. No joint deformity upper and lower extremities.   Skin: no significant rashes, lesions, ulcers. Warm, dry, well-perfused. Neurologic: CN 2-12 grossly intact. Moving all extremities. Alert and oriented.  Psychiatric: Calm. Cooperative.    Labs and Imaging on Admission:  I have personally reviewed following labs and imaging studies  CBC: Recent Labs  Lab 10/14/23 2138  WBC 12.4*  HGB 12.1  HCT 35.8*  MCV 86.7  PLT 222   Basic Metabolic Panel: Recent Labs  Lab 10/14/23 2138  NA 129*  K 2.9*  CL 97*  CO2 20*  GLUCOSE 105*  BUN 15  CREATININE 1.01*  CALCIUM 8.2*  MG 1.6*   GFR: Estimated Creatinine Clearance: 89.5 mL/min (A) (by C-G formula based on SCr of 1.01 mg/dL (H)). Liver Function Tests: Recent Labs  Lab 10/14/23 2138  AST 22  ALT 22  ALKPHOS 62  BILITOT 0.9  PROT 7.6  ALBUMIN 3.6   No results for input(s): "LIPASE", "AMYLASE" in the last 168 hours. No results for input(s): "AMMONIA" in the last 168 hours. Coagulation Profile: No results for input(s): "INR", "PROTIME" in the last 168 hours. Cardiac Enzymes: No results for input(s): "CKTOTAL", "CKMB", "CKMBINDEX", "TROPONINI" in the last 168 hours. BNP (last 3 results) No results for input(s): "PROBNP" in the last 8760 hours. HbA1C: No results for input(s): "HGBA1C" in the last 72 hours. CBG: No results for input(s): "GLUCAP" in the last 168 hours. Lipid Profile: No results for input(s): "CHOL", "HDL", "LDLCALC", "TRIG", "CHOLHDL", "LDLDIRECT" in the last 72 hours. Thyroid Function Tests: No results for input(s): "TSH", "T4TOTAL", "FREET4", "T3FREE", "THYROIDAB" in the last 72 hours. Anemia Panel: No results for input(s): "VITAMINB12", "FOLATE", "FERRITIN", "TIBC", "IRON", "RETICCTPCT" in the last 72 hours. Urine analysis: No results found for: "COLORURINE", "APPEARANCEUR", "LABSPEC", "PHURINE", "GLUCOSEU", "HGBUR", "BILIRUBINUR", "KETONESUR", "PROTEINUR", "UROBILINOGEN", "NITRITE", "LEUKOCYTESUR" Sepsis Labs: @LABRCNTIP (procalcitonin:4,lacticidven:4) ) Recent Results (from the past 240 hours)  Resp panel by RT-PCR (RSV, Flu A&B, Covid) Anterior Nasal Swab     Status: None   Collection Time: 10/14/23  8:56 PM   Specimen: Anterior Nasal Swab  Result Value Ref Range  Status   SARS Coronavirus 2 by RT PCR NEGATIVE NEGATIVE Final    Comment: (NOTE) SARS-CoV-2 target nucleic acids are NOT DETECTED.  The SARS-CoV-2 RNA is generally detectable in upper respiratory specimens during the acute phase of infection. The lowest concentration of SARS-CoV-2 viral copies this assay can detect is 138 copies/mL. A negative result does not preclude SARS-Cov-2 infection and should not be used as the sole basis for treatment or other patient management decisions. A negative result may occur with  improper specimen collection/handling, submission of specimen other than nasopharyngeal swab, presence of viral mutation(s) within the areas targeted by this assay, and inadequate number of viral copies(<138 copies/mL). A negative result must be combined with clinical observations, patient history, and epidemiological information. The expected result is Negative.  Fact Sheet for Patients:  BloggerCourse.com  Fact Sheet for Healthcare Providers:  SeriousBroker.it  This test is no t yet approved or cleared by the [place] FDA and  has been authorized for detection and/or diagnosis of SARS-CoV-2 by FDA under an Emergency Use Authorization (EUA). This EUA will  remain  in effect (meaning this test can be used) for the duration of the COVID-19 declaration under Section 564(b)(1) of the Act, 21 U.S.C.section 360bbb-3(b)(1), unless the authorization is terminated  or revoked sooner.       Influenza A by PCR NEGATIVE NEGATIVE Final   Influenza B by PCR NEGATIVE NEGATIVE Final    Comment: (NOTE) The Xpert Xpress SARS-CoV-2/FLU/RSV plus assay is intended as an aid in the diagnosis of influenza from Nasopharyngeal swab specimens and should not be used as a sole basis for treatment. Nasal washings and aspirates are unacceptable for Xpert Xpress SARS-CoV-2/FLU/RSV testing.  Fact Sheet for  Patients: BloggerCourse.com  Fact Sheet for Healthcare Providers: SeriousBroker.it  This test is not yet approved or cleared by the [place] FDA and has been authorized for detection and/or diagnosis of SARS-CoV-2 by FDA under an Emergency Use Authorization (EUA). This EUA will remain in effect (meaning this test can be used) for the duration of the COVID-19 declaration under Section 564(b)(1) of the Act, 21 U.S.C. section 360bbb-3(b)(1), unless the authorization is terminated or revoked.     Resp Syncytial Virus by PCR NEGATIVE NEGATIVE Final    Comment: (NOTE) Fact Sheet for Patients: BloggerCourse.com  Fact Sheet for Healthcare Providers: SeriousBroker.it  This test is not yet approved or cleared by the [place] FDA and has been authorized for detection and/or diagnosis of SARS-CoV-2 by FDA under an Emergency Use Authorization (EUA). This EUA will remain in effect (meaning this test can be used) for the duration of the COVID-19 declaration under Section 564(b)(1) of the Act, 21 U.S.C. section 360bbb-3(b)(1), unless the authorization is terminated or revoked.  Performed at [facility], [address], [place], [place] [address]   Blood culture (routine x 2)     Status: None (Preliminary result)   Collection Time: 10/14/23 11:03 PM   Specimen: BLOOD  Result Value Ref Range Status   Specimen Description BLOOD BLOOD RIGHT ARM  Final   Special Requests   Final    BOTTLES DRAWN AEROBIC AND ANAEROBIC Blood Culture adequate volume Performed at [facility], [address], [place], [place] [address]    Culture PENDING  Incomplete   Report Status PENDING  Incomplete  Blood culture (routine x 2)     Status: None (Preliminary result)   Collection Time: 10/14/23 11:06 PM   Specimen: BLOOD  Result Value Ref Range Status   Specimen Description BLOOD BLOOD RIGHT ARM  Final    Special Requests   Final    BOTTLES DRAWN AEROBIC AND ANAEROBIC Blood Culture adequate volume Performed at [facility], [address], [place], [place] [address]    Culture PENDING  Incomplete   Report Status PENDING  Incomplete     Radiological Exams on Admission: CT Angio Chest PE W and/or Wo Contrast Addendum Date: 10/15/2023 ADDENDUM REPORT: 10/15/2023 01:21 ADDENDUM: Upon further evaluation and receipt of additional patient history, the previously noted multifocal infiltrates seen within the right lobe likely represents sequelae associated with septic emboli. Electronically Signed   By: Aram Candela M.D.   On: 10/15/2023 01:21   Result Date: 10/15/2023 CLINICAL DATA:  Right-sided chest pain with shortness of breath and leg cramping. EXAM: CT ANGIOGRAPHY CHEST WITH CONTRAST TECHNIQUE: Multidetector CT imaging of the chest was performed using the standard protocol during bolus administration of intravenous contrast. Multiplanar CT image reconstructions and MIPs were obtained to evaluate the vascular anatomy. RADIATION DOSE REDUCTION: This exam was performed according to the departmental dose-optimization program  which includes automated exposure control, adjustment of the mA and/or kV according to patient size and/or use of iterative reconstruction technique. CONTRAST:  75mL OMNIPAQUE IOHEXOL 350 MG/ML SOLN COMPARISON:  None Available. FINDINGS: Cardiovascular: The thoracic aorta is normal in appearance. Satisfactory opacification of the pulmonary arteries to the segmental level. No evidence of pulmonary embolism. Normal heart size. No pericardial effusion. Mediastinum/Nodes: Mild right hilar lymphadenopathy is suspected. Thyroid gland, trachea, and esophagus demonstrate no significant findings. Lungs/Pleura: Mild multifocal infiltrates are seen with within the right lower lobe 0. No pleural effusion or pneumothorax is identified. Upper Abdomen: No acute abnormality. Musculoskeletal: No chest wall  abnormality. No acute or significant osseous findings. Review of the MIP images confirms the above findings. IMPRESSION: 1. No evidence of pulmonary embolism. 2. Mild multifocal right lower lobe infiltrates. Electronically Signed: By: Aram Candela M.D. On: 10/15/2023 00:56   DG Chest 2 View Result Date: 10/14/2023 CLINICAL DATA:  Chest pain and shortness of breath EXAM: CHEST - 2 VIEW COMPARISON:  01/18/2023 FINDINGS: Cardiac shadow is within normal limits. Lungs are well aerated bilaterally. Mild right lower lobe opacity is noted anteriorly consistent with early infiltrate. No effusion is seen. No bony abnormality is noted. IMPRESSION: Mild right lower lobe infiltrate. Electronically Signed   By: Alcide Clever M.D.   On: 10/14/2023 22:58    EKG: Independently reviewed. Sinus tachycardia, rate 107, QTc 536.   Assessment/Plan   1. Pneumonia; ?septic embolism  - Multifocal infiltrates noted in RLL on CTA  - Obtain 3rd set of blood cultures given concern for endocarditis, culture sputum, check MRSA pcr, continue empiric antibiotics, follow cultures and clinical course, check TTE    2. IV drug abuse  - She has been trying to quit but used IV fentanyl yesterday  - Consult TOC for available resources    3. Hypokalemia; hypomagnesemia  - Replacing, will repeat chem panel in am    4. Prolonged QT  - Correct hypokalemia and hypomagnesemia, avoid QT-prolonging medications   5. Hyponatremia  - Continue IVF hydration with NS, repeat chem panel in am     DVT prophylaxis: Lovenox  Code Status: Full  Level of Care: Level of care: Telemetry Family Communication: None present   Disposition Plan:  Patient is from: home  Anticipated d/c is to: Home  Anticipated d/c date is: Possibly as early as 3/6 or 10/17/23 Patient currently: Pending improved electrolytes, stable respiratory status, cultures  Consults called: None  Admission status: Observation     Briscoe Deutscher, MD [facility]  10/15/2023, 3:54 AM

## 2023-10-15 NOTE — Progress Notes (Addendum)
 Pharmacy Antibiotic Note  TANISE RUSSMAN is a 33 y.o. female admitted on 10/14/2023 with shortness of breath/chest and leg pain. Pharmacy has been consulted for vancomycin dosing for pneumonia.  -Cefepime/vancomycin x1 in ED -WBC 12.4, sCr 1.01, Tmax 101.9 -Blood cultures collected, MRSA PCR sent -Cta: no PE, but mild RLL infiltrate seen  Plan: -Ceftriaxone 2g IV every 24 hours per MD -Doxycycline 100mg  IV every 12 hours per MD -Vancomycin 1500mg  IV x1 -Vancomycin 1000mg  IV every 12 hours (AUC 483, Vd 0.72, IBW) -Monitor renal function -Follow up signs of clinical improvement, LOT, de-escalation of antibiotics   Height: 5\' 7"  (170.2 cm) Weight: 81.6 kg (180 lb) IBW/kg (Calculated) : 61.6  Temp (24hrs), Avg:100.4 F (38 C), Min:98.9 F (37.2 C), Max:101.9 F (38.8 C)  Recent Labs  Lab 10/14/23 2138 10/14/23 2306  WBC 12.4*  --   CREATININE 1.01*  --   LATICACIDVEN  --  1.2    Estimated Creatinine Clearance: 89.5 mL/min (A) (by C-G formula based on SCr of 1.01 mg/dL (H)).    Allergies  Allergen Reactions   Tramadol Hives    Antimicrobials this admission: Cefepime 3/4 >>  Vancomycin 3/4 >>   Microbiology results: 3/4 BCx:  3/5 MRSA PCR:   Thank you for allowing pharmacy to be a part of this patient's care.  Arabella Merles, PharmD. Clinical Pharmacist 10/15/2023 4:05 AM

## 2023-10-15 NOTE — Progress Notes (Signed)
 PROGRESS NOTE    [name]  ZOX:[id number] DOB: 02-24-1991 DOA: 10/14/2023 PCP: Mechele Claude, MD  No chief complaint on file.   Hospital Course:  Pam Soto is 33 y.o. female with history of IV drug abuse who presents with right-sided chest pain and shortness of breath.  Patient endorses muscle cramps over the past few days which she believes are secondary to depleted electrolytes.  She reports her pleuritic chest pain has become severe and thus presented to the hospital.  She reports she has been trying to abstain from illicit substances and has been taking methadone.  She was concerned that her musculoskeletal pain was a result of withdrawal and she injected fentanyl yesterday.  On arrival to the ED patient was febrile, saturating the mid 90s on room air, tachypneic, tachycardic.  Labs reveal hyponatremia to 129, hypokalemia to 2.9, hypomagnesemia 1.6, leukocytosis 12.4 K.  Respiratory viral panel was negative.  Chest x-ray concerning for multifocal pneumonia.  CTA concerning for multifocal infiltrates which may be septic emboli.  Blood cultures were collected, the patient was started on IV fluids.  Subjective: Pt reports continued right side chest pain with deep inspiration.  Also has continued cough.  She is requesting to resume her home dose methadone   Objective: Vitals:   10/15/23 0700 10/15/23 0800 10/15/23 0812 10/15/23 0900  BP: 107/72 99/69  (!) 91/54  Pulse: 77 79  84  Resp: 16 (!) 28  13  Temp:   98.2 F (36.8 C)   TempSrc:   Oral   SpO2: 96% 98%  96%  Weight:      Height:        Intake/Output Summary (Last 24 hours) at 10/15/2023 1017 Last data filed at 10/15/2023 0743 Gross per 24 hour  Intake 1900 ml  Output --  Net 1900 ml   Filed Weights   10/14/23 2300  Weight: 81.6 kg    Examination: General exam: Appears calm and uncomfortable.  Respiratory system: No work of breathing, symmetric chest wall expansion Cardiovascular system: S1 & S2 heard, RRR.   Gastrointestinal system: Abdomen is nondistended, soft and nontender.  Neuro: Alert and oriented. No focal neurological deficits. Extremities: Symmetric, expected ROM Skin: No rashes, lesions Psychiatry: Demonstrates appropriate judgement and insight. Mood & affect appropriate for situation.   Assessment & Plan:  Principal Problem:   Pneumonia Active Problems:   Hyponatremia   Hypokalemia   Hypomagnesemia   Prolonged QT interval   Substance abuse (HCC)    Sepsis -Criteria met on arrival leukocytosis, tachycardia, tachypnea.  Source: Pneumonia and bacteremia as above - On IV fluids - Broad-spectrum antibiotics - Cultures and pneumonia treatment as below  Multifocal pneumonia GPC Bacteremia - Multifocal infiltrates noted in right lower lobe on CTA.  Concerning for septic emboli - Cultures now positive for gram-positive cocci. - Sputum culture sent - MRSA PCR, follow - HIV negative -Have consulted infectious disease - Echocardiogram: Without evidence of endocarditis.  Will likely need TEE. - Continue with vancomycin for now, pharmacy to dose - Also continue with ceftriaxone and doxycycline for CAP coverage until we are better able to identify bacterial growth  IV drug abuse - Patient reports she has been working towards abstinence and is on methadone.  Did use IV fentanyl yesterday -- Resume home dose methadone  - TOC consulted for resource assistance  Hypokalemia Hypomagnesemia - Replace as needed - Follow BMP  Prolonged QT - Continue to optimize electrolytes, avoid QT prolonging meds - Repeat EKG this  AM shows Qtc improved to normal  Hyponatremia - Continue IV hydration with NS  DVT prophylaxis: Lovenox   Code Status: Full Code Family Communication:  Discussed directly with patient Disposition:  Inpatient, still hospitalized for IV abx, will discharge to home when medically stable.  Consultants:    Procedures:    Antimicrobials:  Anti-infectives (From  admission, onward)    Start     Dose/Rate Route Frequency Ordered Stop   10/15/23 1000  vancomycin (VANCOCIN) IVPB 1000 mg/200 mL premix        1,000 mg 200 mL/hr over 60 Minutes Intravenous Every 12 hours 10/15/23 0406     10/15/23 0600  cefTRIAXone (ROCEPHIN) 2 g in sodium chloride 0.9 % 100 mL IVPB        2 g 200 mL/hr over 30 Minutes Intravenous Every 24 hours 10/15/23 0354 10/20/23 0559   10/15/23 0400  doxycycline (VIBRAMYCIN) 100 mg in sodium chloride 0.9 % 250 mL IVPB        100 mg 125 mL/hr over 120 Minutes Intravenous Every 12 hours 10/15/23 0354     10/14/23 2315  ceFEPIme (MAXIPIME) 2 g in sodium chloride 0.9 % 100 mL IVPB        2 g 200 mL/hr over 30 Minutes Intravenous  Once 10/14/23 2303 10/15/23 0109   10/14/23 2315  Vancomycin (VANCOCIN) 1,500 mg in sodium chloride 0.9 % 500 mL IVPB        1,500 mg 250 mL/hr over 120 Minutes Intravenous  Once 10/14/23 2304 10/15/23 0228       Data Reviewed: I have personally reviewed following labs and imaging studies CBC: Recent Labs  Lab 10/14/23 2138 10/15/23 0519  WBC 12.4* 11.0*  HGB 12.1 10.3*  HCT 35.8* 31.1*  MCV 86.7 88.6  PLT 222 191   Basic Metabolic Panel: Recent Labs  Lab 10/14/23 2138 10/15/23 0519  NA 129* 134*  K 2.9* 2.5*  CL 97* 102  CO2 20* 24  GLUCOSE 105* 114*  BUN 15 14  CREATININE 1.01* 0.77  CALCIUM 8.2* 8.0*  MG 1.6*  --    GFR: Estimated Creatinine Clearance: 113 mL/min (by C-G formula based on SCr of 0.77 mg/dL). Liver Function Tests: Recent Labs  Lab 10/14/23 2138  AST 22  ALT 22  ALKPHOS 62  BILITOT 0.9  PROT 7.6  ALBUMIN 3.6   CBG: No results for input(s): "GLUCAP" in the last 168 hours.  Recent Results (from the past 240 hours)  Resp panel by RT-PCR (RSV, Flu A&B, Covid) Anterior Nasal Swab     Status: None   Collection Time: 10/14/23  8:56 PM   Specimen: Anterior Nasal Swab  Result Value Ref Range Status   SARS Coronavirus 2 by RT PCR NEGATIVE NEGATIVE Final     Comment: (NOTE) SARS-CoV-2 target nucleic acids are NOT DETECTED.  The SARS-CoV-2 RNA is generally detectable in upper respiratory specimens during the acute phase of infection. The lowest concentration of SARS-CoV-2 viral copies this assay can detect is 138 copies/mL. A negative result does not preclude SARS-Cov-2 infection and should not be used as the sole basis for treatment or other patient management decisions. A negative result may occur with  improper specimen collection/handling, submission of specimen other than nasopharyngeal swab, presence of viral mutation(s) within the areas targeted by this assay, and inadequate number of viral copies(<138 copies/mL). A negative result must be combined with clinical observations, patient history, and epidemiological information. The expected result is Negative.  Fact Sheet for  Patients:  BloggerCourse.com  Fact Sheet for Healthcare Providers:  SeriousBroker.it  This test is no t yet approved or cleared by the [place] FDA and  has been authorized for detection and/or diagnosis of SARS-CoV-2 by FDA under an Emergency Use Authorization (EUA). This EUA will remain  in effect (meaning this test can be used) for the duration of the COVID-19 declaration under Section 564(b)(1) of the Act, 21 U.S.C.section 360bbb-3(b)(1), unless the authorization is terminated  or revoked sooner.       Influenza A by PCR NEGATIVE NEGATIVE Final   Influenza B by PCR NEGATIVE NEGATIVE Final    Comment: (NOTE) The Xpert Xpress SARS-CoV-2/FLU/RSV plus assay is intended as an aid in the diagnosis of influenza from Nasopharyngeal swab specimens and should not be used as a sole basis for treatment. Nasal washings and aspirates are unacceptable for Xpert Xpress SARS-CoV-2/FLU/RSV testing.  Fact Sheet for Patients: BloggerCourse.com  Fact Sheet for Healthcare  Providers: SeriousBroker.it  This test is not yet approved or cleared by the [place] FDA and has been authorized for detection and/or diagnosis of SARS-CoV-2 by FDA under an Emergency Use Authorization (EUA). This EUA will remain in effect (meaning this test can be used) for the duration of the COVID-19 declaration under Section 564(b)(1) of the Act, 21 U.S.C. section 360bbb-3(b)(1), unless the authorization is terminated or revoked.     Resp Syncytial Virus by PCR NEGATIVE NEGATIVE Final    Comment: (NOTE) Fact Sheet for Patients: BloggerCourse.com  Fact Sheet for Healthcare Providers: SeriousBroker.it  This test is not yet approved or cleared by the [place] FDA and has been authorized for detection and/or diagnosis of SARS-CoV-2 by FDA under an Emergency Use Authorization (EUA). This EUA will remain in effect (meaning this test can be used) for the duration of the COVID-19 declaration under Section 564(b)(1) of the Act, 21 U.S.C. section 360bbb-3(b)(1), unless the authorization is terminated or revoked.  Performed at [facility], [address] [place], [place] [address]   Blood culture (routine x 2)     Status: None (Preliminary result)   Collection Time: 10/14/23 11:03 PM   Specimen: BLOOD  Result Value Ref Range Status   Specimen Description BLOOD BLOOD RIGHT ARM  Final   Special Requests   Final    BOTTLES DRAWN AEROBIC AND ANAEROBIC Blood Culture adequate volume   Culture   Final    NO GROWTH < 12 HOURS Performed at [facility], [address], [place], [place] [address]    Report Status PENDING  Incomplete  Blood culture (routine x 2)     Status: None (Preliminary result)   Collection Time: 10/14/23 11:06 PM   Specimen: BLOOD  Result Value Ref Range Status   Specimen Description BLOOD BLOOD RIGHT ARM  Final   Special Requests   Final    BOTTLES DRAWN AEROBIC AND ANAEROBIC  Blood Culture adequate volume   Culture   Final    NO GROWTH < 12 HOURS Performed at [facility], [address] [place], [place] [address]    Report Status PENDING  Incomplete  Culture, blood (single) w Reflex to ID Panel     Status: None (Preliminary result)   Collection Time: 10/15/23  5:41 AM   Specimen: BLOOD  Result Value Ref Range Status   Specimen Description BLOOD BLOOD RIGHT HAND  Final   Special Requests   Final    BOTTLES DRAWN AEROBIC ONLY Blood Culture adequate volume   Culture   Final  NO GROWTH <12 HOURS Performed at [facility], [address] [place], [place] [address]    Report Status PENDING  Incomplete     Radiology Studies: CT Angio Chest PE W and/or Wo Contrast Addendum Date: 10/15/2023 ADDENDUM REPORT: 10/15/2023 01:21 ADDENDUM: Upon further evaluation and receipt of additional patient history, the previously noted multifocal infiltrates seen within the right lobe likely represents sequelae associated with septic emboli. Electronically Signed   By: Aram Candela M.D.   On: 10/15/2023 01:21   Result Date: 10/15/2023 CLINICAL DATA:  Right-sided chest pain with shortness of breath and leg cramping. EXAM: CT ANGIOGRAPHY CHEST WITH CONTRAST TECHNIQUE: Multidetector CT imaging of the chest was performed using the standard protocol during bolus administration of intravenous contrast. Multiplanar CT image reconstructions and MIPs were obtained to evaluate the vascular anatomy. RADIATION DOSE REDUCTION: This exam was performed according to the departmental dose-optimization program which includes automated exposure control, adjustment of the mA and/or kV according to patient size and/or use of iterative reconstruction technique. CONTRAST:  75mL OMNIPAQUE IOHEXOL 350 MG/ML SOLN COMPARISON:  None Available. FINDINGS: Cardiovascular: The thoracic aorta is normal in appearance. Satisfactory opacification of the pulmonary arteries to the segmental level. No evidence of  pulmonary embolism. Normal heart size. No pericardial effusion. Mediastinum/Nodes: Mild right hilar lymphadenopathy is suspected. Thyroid gland, trachea, and esophagus demonstrate no significant findings. Lungs/Pleura: Mild multifocal infiltrates are seen with within the right lower lobe 0. No pleural effusion or pneumothorax is identified. Upper Abdomen: No acute abnormality. Musculoskeletal: No chest wall abnormality. No acute or significant osseous findings. Review of the MIP images confirms the above findings. IMPRESSION: 1. No evidence of pulmonary embolism. 2. Mild multifocal right lower lobe infiltrates. Electronically Signed: By: Aram Candela M.D. On: 10/15/2023 00:56   DG Chest 2 View Result Date: 10/14/2023 CLINICAL DATA:  Chest pain and shortness of breath EXAM: CHEST - 2 VIEW COMPARISON:  01/18/2023 FINDINGS: Cardiac shadow is within normal limits. Lungs are well aerated bilaterally. Mild right lower lobe opacity is noted anteriorly consistent with early infiltrate. No effusion is seen. No bony abnormality is noted. IMPRESSION: Mild right lower lobe infiltrate. Electronically Signed   By: Alcide Clever M.D.   On: 10/14/2023 22:58    Scheduled Meds:  enoxaparin (LOVENOX) injection  40 mg Subcutaneous Q24H   pantoprazole  40 mg Oral Daily   sodium chloride flush  3 mL Intravenous Q12H   Continuous Infusions:  sodium chloride 125 mL/hr at 10/15/23 0535   cefTRIAXone (ROCEPHIN)  IV 2 g (10/15/23 0918)   doxycycline (VIBRAMYCIN) IV Stopped (10/15/23 0743)   vancomycin       LOS: 0 days    Total time spent interpreting labs and vitals, coordinating care amongst consultants and care team members, directly assessing and discussing care with the patient and/or family: 55 min   Debarah Crape, DO [facility]  To contact the attending physician between 7A-7P please use Epic Chat. To contact the covering physician during after hours 7P-7A, please review Amion.   10/15/2023,  10:17 AM   *This document has been created with the assistance of dictation software. Please excuse typographical errors. *

## 2023-10-15 NOTE — ED Notes (Signed)
Hospitalist has been paged.

## 2023-10-15 NOTE — Progress Notes (Signed)
   10/15/23 2003  Assess: MEWS Score  Temp (!) 102.9 F (39.4 C)  BP (!) 148/87  MAP (mmHg) 103  Pulse Rate (!) 113  SpO2 96 %  O2 Device Room Air  Assess: MEWS Score  MEWS Temp 2  MEWS Systolic 0  MEWS Pulse 2  MEWS RR 0  MEWS LOC 0  MEWS Score 4  MEWS Score Color Red  Assess: if the MEWS score is Yellow or Red  Were vital signs accurate and taken at a resting state? Yes  Does the patient meet 2 or more of the SIRS criteria? Yes  Does the patient have a confirmed or suspected source of infection? Yes  MEWS guidelines implemented  Yes, red  Treat  MEWS Interventions Considered administering scheduled or prn medications/treatments as ordered  Take Vital Signs  Increase Vital Sign Frequency  Red: Q1hr x2, continue Q4hrs until patient remains green for 12hrs  Escalate  MEWS: Escalate Red: Discuss with charge nurse and notify provider. Consider notifying RRT. If remains red for 2 hours consider need for higher level of care  Notify: Charge Nurse/RN  Name of Charge Nurse/RN Notified Roque Lias, RN  Provider Notification  Provider Name/Title Dr. Thomes Dinning  Date Provider Notified 10/15/23  Time Provider Notified 2021  Notification Reason Change in status  Provider response Other (Comment) (monitor closely)  Date of Provider Response 10/15/23  Time of Provider Response 2024  Assess: SIRS CRITERIA  SIRS Temperature  1  SIRS Respirations  0  SIRS Pulse 1  SIRS WBC 0  SIRS Score Sum  2

## 2023-10-15 NOTE — Progress Notes (Signed)
   10/15/23 1408  TOC Brief Assessment  Insurance and Status Reviewed  Patient has primary care physician Yes  Home environment has been reviewed Home  Prior level of function: independent  Prior/Current Home Services No current home services  Social Drivers of Health Review SDOH reviewed no interventions necessary  Readmission risk has been reviewed Yes  Transition of care needs no transition of care needs at this time   TOC following, Patient may leave AMA, multiple admission. Substance abuse resources added.

## 2023-10-15 NOTE — ED Provider Notes (Signed)
 [place] EMERGENCY DEPARTMENT AT [facility] Provider Note  CSN: [id number] Arrival date & time: 10/14/23 2039  Chief Complaint(s) No chief complaint on file.  HPI Pam Soto is a 33 y.o. 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  States that she had the flu last week but has had persistent symptoms.  Started to have worsening muscle cramping and chills.  Went to urgent care yesterday who told her that she was withdrawing and discharged her.  Here in the emergency room and, she is endorsing pleuritic right-sided chest pain, exertional shortness of breath.  Patient arrives febrile and tachycardic.  Most recently used IV drugs yesterday.   Past Medical History Past Medical History:  Diagnosis Date   Allergy    Anemia    Cough 05/04/2015   Depression    Non-restorable tooth 04/2015   teeth   PTSD (post-traumatic stress disorder)    Substance abuse (HCC)    Swelling of gums 04/2015   Patient Active Problem List   Diagnosis Date Noted   Pneumonia 10/15/2023   Opiate dependence (HCC) 11/06/2016   Home Medication(s) Prior to Admission medications   Medication Sig Start Date End Date Taking? Authorizing Provider  Buprenorphine HCl-Naloxone HCl 4-1 MG FILM Place 1 Film under the tongue daily.    [provider]  Prenatal Vit-Fe Fumarate-FA (MULTIVITAMIN-PRENATAL) 27-0.8 MG TABS tablet Take 1 tablet by mouth daily at 12 noon.    [provider]                                                                                                                                    Past Surgical History Past Surgical History:  Procedure Laterality Date   MULTIPLE EXTRACTIONS WITH ALVEOLOPLASTY N/A 05/08/2015   Procedure: MULTIPLE TEETH EXTRACTIONS WITH ALVEOLOPLASTY;  Surgeon: Ocie Doyne, DDS;  Location: [facility];  Service: Oral  Surgery;  Laterality: N/A;   TONSILLECTOMY     TONSILLECTOMY AND ADENOIDECTOMY  02/19/2001   Family History Family History  Problem Relation Age of Onset   Depression Mother    Drug abuse Mother    Alcohol abuse Father    Cirrhosis Father     Social History Social History   Tobacco Use   Smoking status: Every Day    Current packs/day: 1.00    Average packs/day: 1 pack/day for 5.0 years (5.0 ttl pk-yrs)    Types: Cigarettes   Smokeless tobacco: Never  Vaping Use   Vaping status: Never Used  Substance Use Topics   Alcohol use: No   Drug use: Not Currently    Types: Cocaine    Comment: percocet   Allergies Tramadol  Review of Systems Review of Systems  Constitutional:  Positive for fever.  Respiratory:  Positive for cough and chest tightness.  Cardiovascular:  Positive for chest pain.    Physical Exam Vital Signs  I have reviewed the triage vital signs BP 98/62   Pulse 83   Temp 98.9 F (37.2 C)   Resp 19   Ht 5\' 7"  (1.702 m)   Wt 81.6 kg   SpO2 97%   BMI 28.19 kg/m   Physical Exam Vitals and nursing note reviewed.  Constitutional:      General: She is not in acute distress.    Appearance: She is well-developed.  HENT:     Head: Normocephalic and atraumatic.  Eyes:     Conjunctiva/sclera: Conjunctivae normal.  Cardiovascular:     Rate and Rhythm: Regular rhythm. Tachycardia present.     Heart sounds: No murmur heard. Pulmonary:     Effort: Pulmonary effort is normal. No respiratory distress.     Breath sounds: Normal breath sounds.  Abdominal:     Palpations: Abdomen is soft.     Tenderness: There is no abdominal tenderness.  Musculoskeletal:        General: No swelling.     Cervical back: Neck supple.  Skin:    General: Skin is warm and dry.     Capillary Refill: Capillary refill takes less than 2 seconds.  Neurological:     Mental Status: She is alert.  Psychiatric:        Mood and Affect: Mood normal.     ED Results and  Treatments Labs (all labs ordered are listed, but only abnormal results are displayed) Labs Reviewed  BASIC METABOLIC PANEL - Abnormal; Notable for the following components:      Result Value   Sodium 129 (*)    Potassium 2.9 (*)    Chloride 97 (*)    CO2 20 (*)    Glucose, Bld 105 (*)    Creatinine, Ser 1.01 (*)    Calcium 8.2 (*)    All other components within normal limits  CBC - Abnormal; Notable for the following components:   WBC 12.4 (*)    HCT 35.8 (*)    All other components within normal limits  MAGNESIUM - Abnormal; Notable for the following components:   Magnesium 1.6 (*)    All other components within normal limits  RESP PANEL BY RT-PCR (RSV, FLU A&B, COVID)  RVPGX2  CULTURE, BLOOD (ROUTINE X 2)  CULTURE, BLOOD (ROUTINE X 2)  HEPATIC FUNCTION PANEL  LACTIC ACID, PLASMA  POC URINE PREG, ED  TROPONIN I (HIGH SENSITIVITY)  TROPONIN I (HIGH SENSITIVITY)                                                                                                                          Radiology CT Angio Chest PE W and/or Wo Contrast Addendum Date: 10/15/2023 ADDENDUM REPORT: 10/15/2023 01:21 ADDENDUM: Upon further evaluation and receipt of additional patient history, the previously noted multifocal infiltrates seen within the right lobe likely represents sequelae associated with septic emboli. Electronically Signed  By: Aram Candela M.D.   On: 10/15/2023 01:21   Result Date: 10/15/2023 CLINICAL DATA:  Right-sided chest pain with shortness of breath and leg cramping. EXAM: CT ANGIOGRAPHY CHEST WITH CONTRAST TECHNIQUE: Multidetector CT imaging of the chest was performed using the standard protocol during bolus administration of intravenous contrast. Multiplanar CT image reconstructions and MIPs were obtained to evaluate the vascular anatomy. RADIATION DOSE REDUCTION: This exam was performed according to the departmental dose-optimization program which includes automated exposure  control, adjustment of the mA and/or kV according to patient size and/or use of iterative reconstruction technique. CONTRAST:  75mL OMNIPAQUE IOHEXOL 350 MG/ML SOLN COMPARISON:  None Available. FINDINGS: Cardiovascular: The thoracic aorta is normal in appearance. Satisfactory opacification of the pulmonary arteries to the segmental level. No evidence of pulmonary embolism. Normal heart size. No pericardial effusion. Mediastinum/Nodes: Mild right hilar lymphadenopathy is suspected. Thyroid gland, trachea, and esophagus demonstrate no significant findings. Lungs/Pleura: Mild multifocal infiltrates are seen with within the right lower lobe 0. No pleural effusion or pneumothorax is identified. Upper Abdomen: No acute abnormality. Musculoskeletal: No chest wall abnormality. No acute or significant osseous findings. Review of the MIP images confirms the above findings. IMPRESSION: 1. No evidence of pulmonary embolism. 2. Mild multifocal right lower lobe infiltrates. Electronically Signed: By: Aram Candela M.D. On: 10/15/2023 00:56   DG Chest 2 View Result Date: 10/14/2023 CLINICAL DATA:  Chest pain and shortness of breath EXAM: CHEST - 2 VIEW COMPARISON:  01/18/2023 FINDINGS: Cardiac shadow is within normal limits. Lungs are well aerated bilaterally. Mild right lower lobe opacity is noted anteriorly consistent with early infiltrate. No effusion is seen. No bony abnormality is noted. IMPRESSION: Mild right lower lobe infiltrate. Electronically Signed   By: Alcide Clever M.D.   On: 10/14/2023 22:58    Pertinent labs & imaging results that were available during my care of the patient were reviewed by me and considered in my medical decision making (see MDM for details).  Medications Ordered in ED Medications  Vancomycin (VANCOCIN) 1,500 mg in sodium chloride 0.9 % 500 mL IVPB (1,500 mg Intravenous New Bag/Given 10/15/23 0108)  acetaminophen (TYLENOL) tablet 650 mg (650 mg Oral Given 10/14/23 2317)  lactated  ringers bolus 1,000 mL (0 mLs Intravenous Stopped 10/15/23 0210)  iohexol (OMNIPAQUE) 350 MG/ML injection 75 mL (75 mLs Intravenous Contrast Given 10/14/23 2303)  ceFEPIme (MAXIPIME) 2 g in sodium chloride 0.9 % 100 mL IVPB (0 g Intravenous Stopped 10/15/23 0109)                                                                                                                                     Procedures .Critical Care  Performed by: Glendora Score, MD Authorized by: Glendora Score, MD   Critical care provider statement:    Critical care time (minutes):  30   Critical care was necessary to treat or prevent imminent or life-threatening deterioration of  the following conditions:  Sepsis   Critical care was time spent personally by me on the following activities:  Development of treatment plan with patient or surrogate, discussions with consultants, evaluation of patient's response to treatment, examination of patient, ordering and review of laboratory studies, ordering and review of radiographic studies, ordering and performing treatments and interventions, pulse oximetry, re-evaluation of patient's condition and review of old charts   (including critical care time)  Medical Decision Making / ED Course   This patient presents to the ED for concern of chest pain, shortness of breath, this involves an extensive number of treatment options, and is a complaint that carries with it a high risk of complications and morbidity.  The differential diagnosis includes septic emboli, Pe, PTX, Pulmonary Edema, ARDS, COPD/Asthma, ACS, CHF exacerbation, Arrhythmia, Pericardial Effusion/Tamponade, Anemia, Sepsis, Acidosis/Hypercapnia, Anxiety, Viral URI  MDM: Patient seen emergency room for evaluation of chest pain and shortness of breath.  Physical exam with tachycardia, track marks over multiple extremities but is otherwise unremarkable.  Laboratory evaluation with leukocytosis to 12.4 and as the patient arrives  tachycardic and febrile she meets SIRS criteria and blood cultures obtained and patient started on broad-spectrum antibiotics with Vanco and cefepime.  Sodium 129, potassium 2.9, CO2 20, lactic acid normal, high-sensitivity troponin normal.  Imaging concerning for a right lower lobe infiltrate.  Follow-up PE study obtained showing multifocal infiltrates concerning for septic emboli.  Given history of IV drug use and exertional shortness of breath, patient require hospital admission for echocardiogram tomorrow to rule out endocarditis.  Patient admitted.   Additional history obtained: -Additional history obtained from sister -External records from outside source obtained and reviewed including: Chart review including previous notes, labs, imaging, consultation notes   Lab Tests: -I ordered, reviewed, and interpreted labs.   The pertinent results include:   Labs Reviewed  BASIC METABOLIC PANEL - Abnormal; Notable for the following components:      Result Value   Sodium 129 (*)    Potassium 2.9 (*)    Chloride 97 (*)    CO2 20 (*)    Glucose, Bld 105 (*)    Creatinine, Ser 1.01 (*)    Calcium 8.2 (*)    All other components within normal limits  CBC - Abnormal; Notable for the following components:   WBC 12.4 (*)    HCT 35.8 (*)    All other components within normal limits  MAGNESIUM - Abnormal; Notable for the following components:   Magnesium 1.6 (*)    All other components within normal limits  RESP PANEL BY RT-PCR (RSV, FLU A&B, COVID)  RVPGX2  CULTURE, BLOOD (ROUTINE X 2)  CULTURE, BLOOD (ROUTINE X 2)  HEPATIC FUNCTION PANEL  LACTIC ACID, PLASMA  POC URINE PREG, ED  TROPONIN I (HIGH SENSITIVITY)  TROPONIN I (HIGH SENSITIVITY)      EKG   EKG Interpretation Date/Time:  Tuesday October 14 2023 20:56:17 EST Ventricular Rate:  107 PR Interval:  138 QRS Duration:  74 QT Interval:  402 QTC Calculation: 536 R Axis:   48  Text Interpretation: Sinus tachycardia Nonspecific ST  abnormality Prolonged QT Confirmed by Gloris Manchester ([id number]) on 10/14/2023 10:40:27 PM         Imaging Studies ordered: I ordered imaging studies including CT PE, chest x-ray I independently visualized and interpreted imaging. I agree with the radiologist interpretation   Medicines ordered and prescription drug management: Meds ordered this encounter  Medications   acetaminophen (TYLENOL) tablet 650 mg  lactated ringers bolus 1,000 mL   iohexol (OMNIPAQUE) 350 MG/ML injection 75 mL   ceFEPIme (MAXIPIME) 2 g in sodium chloride 0.9 % 100 mL IVPB   Vancomycin (VANCOCIN) 1,500 mg in sodium chloride 0.9 % 500 mL IVPB    Indication::   Sepsis    -I have reviewed the patients home medicines and have made adjustments as needed  Critical interventions Antibiotics, fluids    Cardiac Monitoring: The patient was maintained on a cardiac monitor.  I personally viewed and interpreted the cardiac monitored which showed an underlying rhythm of: Sinus tachycardia  Social Determinants of Health:  Factors impacting patients care include: Active IV drug use   Reevaluation: After the interventions noted above, I reevaluated the patient and found that they have :improved  Co morbidities that complicate the patient evaluation  Past Medical History:  Diagnosis Date   Allergy    Anemia    Cough 05/04/2015   Depression    Non-restorable tooth 04/2015   teeth   PTSD (post-traumatic stress disorder)    Substance abuse (HCC)    Swelling of gums 04/2015      Dispostion: I considered admission for this patient, and patient will require hospital admission for septic emboli     Final Clinical Impression(s) / ED Diagnoses Final diagnoses:  None     @PCDICTATION @    Glendora Score, MD 10/15/23 [phone]

## 2023-10-15 NOTE — ED Notes (Signed)
Hospitalist in room at this time.  

## 2023-10-15 NOTE — Plan of Care (Signed)

## 2023-10-16 DIAGNOSIS — B9561 Methicillin susceptible Staphylococcus aureus infection as the cause of diseases classified elsewhere: Secondary | ICD-10-CM

## 2023-10-16 DIAGNOSIS — E876 Hypokalemia: Secondary | ICD-10-CM | POA: Diagnosis not present

## 2023-10-16 DIAGNOSIS — R7881 Bacteremia: Secondary | ICD-10-CM

## 2023-10-16 DIAGNOSIS — J15211 Pneumonia due to Methicillin susceptible Staphylococcus aureus: Secondary | ICD-10-CM | POA: Diagnosis not present

## 2023-10-16 DIAGNOSIS — R9431 Abnormal electrocardiogram [ECG] [EKG]: Secondary | ICD-10-CM | POA: Diagnosis not present

## 2023-10-16 DIAGNOSIS — F1120 Opioid dependence, uncomplicated: Secondary | ICD-10-CM

## 2023-10-16 DIAGNOSIS — F1910 Other psychoactive substance abuse, uncomplicated: Secondary | ICD-10-CM | POA: Diagnosis not present

## 2023-10-16 LAB — CBC WITH DIFFERENTIAL/PLATELET
Abs Immature Granulocytes: 0.05 10*3/uL (ref 0.00–0.07)
Basophils Absolute: 0 10*3/uL (ref 0.0–0.1)
Basophils Relative: 0 %
Eosinophils Absolute: 0.1 10*3/uL (ref 0.0–0.5)
Eosinophils Relative: 1 %
HCT: 30.5 % — ABNORMAL LOW (ref 36.0–46.0)
Hemoglobin: 9.7 g/dL — ABNORMAL LOW (ref 12.0–15.0)
Immature Granulocytes: 1 %
Lymphocytes Relative: 22 %
Lymphs Abs: 2.4 10*3/uL (ref 0.7–4.0)
MCH: 28.9 pg (ref 26.0–34.0)
MCHC: 31.8 g/dL (ref 30.0–36.0)
MCV: 90.8 fL (ref 80.0–100.0)
Monocytes Absolute: 0.9 10*3/uL (ref 0.1–1.0)
Monocytes Relative: 8 %
Neutro Abs: 7.5 10*3/uL (ref 1.7–7.7)
Neutrophils Relative %: 68 %
Platelets: 182 10*3/uL (ref 150–400)
RBC: 3.36 MIL/uL — ABNORMAL LOW (ref 3.87–5.11)
RDW: 13 % (ref 11.5–15.5)
WBC: 11 10*3/uL — ABNORMAL HIGH (ref 4.0–10.5)
nRBC: 0 % (ref 0.0–0.2)

## 2023-10-16 LAB — HEPATITIS B SURFACE ANTIGEN: Hepatitis B Surface Ag: NONREACTIVE

## 2023-10-16 LAB — COMPREHENSIVE METABOLIC PANEL
ALT: 18 U/L (ref 0–44)
AST: 21 U/L (ref 15–41)
Albumin: 2.5 g/dL — ABNORMAL LOW (ref 3.5–5.0)
Alkaline Phosphatase: 62 U/L (ref 38–126)
Anion gap: 4 — ABNORMAL LOW (ref 5–15)
BUN: 8 mg/dL (ref 6–20)
CO2: 21 mmol/L — ABNORMAL LOW (ref 22–32)
Calcium: 7.9 mg/dL — ABNORMAL LOW (ref 8.9–10.3)
Chloride: 109 mmol/L (ref 98–111)
Creatinine, Ser: 0.6 mg/dL (ref 0.44–1.00)
GFR, Estimated: >60 mL/min (ref >60)
Glucose, Bld: 114 mg/dL — ABNORMAL HIGH (ref 70–99)
Potassium: 3.7 mmol/L (ref 3.5–5.1)
Sodium: 134 mmol/L — ABNORMAL LOW (ref 135–145)
Total Bilirubin: 0.4 mg/dL (ref 0.0–1.2)
Total Protein: 5.9 g/dL — ABNORMAL LOW (ref 6.5–8.1)

## 2023-10-16 LAB — MAGNESIUM: Magnesium: 1.9 mg/dL (ref 1.7–2.4)

## 2023-10-16 LAB — PHOSPHORUS: Phosphorus: 2.3 mg/dL — ABNORMAL LOW (ref 2.5–4.6)

## 2023-10-16 MED ORDER — GUAIFENESIN-DM 100-10 MG/5ML PO SYRP
5.0000 mL | ORAL_SOLUTION | ORAL | Status: DC | PRN
  Administered 2023-10-16 – 2023-10-18 (×2): 5 mL via ORAL
  Filled 2023-10-16 (×2): qty 5

## 2023-10-16 MED ORDER — GABAPENTIN 100 MG PO CAPS
200.0000 mg | ORAL_CAPSULE | Freq: Three times a day (TID) | ORAL | Status: DC
  Administered 2023-10-16 – 2023-10-18 (×7): 200 mg via ORAL
  Filled 2023-10-16 (×7): qty 2

## 2023-10-16 MED ORDER — KETOROLAC TROMETHAMINE 30 MG/ML IJ SOLN
30.0000 mg | Freq: Three times a day (TID) | INTRAMUSCULAR | Status: DC
  Administered 2023-10-16 – 2023-10-18 (×6): 30 mg via INTRAVENOUS
  Filled 2023-10-16 (×6): qty 1

## 2023-10-16 MED ORDER — CEFAZOLIN SODIUM-DEXTROSE 2-4 GM/100ML-% IV SOLN
2.0000 g | Freq: Three times a day (TID) | INTRAVENOUS | Status: DC
  Administered 2023-10-16 – 2023-10-19 (×11): 2 g via INTRAVENOUS
  Filled 2023-10-16 (×11): qty 100

## 2023-10-16 MED ORDER — LIDOCAINE 5 % EX PTCH
1.0000 | MEDICATED_PATCH | CUTANEOUS | Status: DC
  Administered 2023-10-16 – 2023-10-28 (×13): 1 via TRANSDERMAL
  Filled 2023-10-16 (×14): qty 1

## 2023-10-16 NOTE — Progress Notes (Signed)
 ID Brief note ( remote fu, not seen in person, no charge note)  T max 102.9  3/4 blood cx identified as MSSA 3/5 blood cx pending  3/6 blood cx sent   3/6 TTE with no gross vegetations or endocarditis       Latest Ref Rng & Units 10/16/2023    4:08 AM 10/15/2023    5:19 AM 10/14/2023    9:38 PM  CBC  WBC 4.0 - 10.5 K/uL 11.0  11.0  12.4   Hemoglobin 12.0 - 15.0 g/dL 9.7  16.1  09.6   Hematocrit 36.0 - 46.0 % 30.5  31.1  35.8   Platelets 150 - 400 K/uL 182  191  222       Latest Ref Rng & Units 10/16/2023    4:08 AM 10/15/2023    3:49 PM 10/15/2023    5:19 AM  CMP  Glucose 70 - 99 mg/dL 045  409  811   BUN 6 - 20 mg/dL 8  12  14    Creatinine 0.44 - 1.00 mg/dL 9.14  7.82  9.56   Sodium 135 - 145 mmol/L 134  132  134   Potassium 3.5 - 5.1 mmol/L 3.7  4.2  2.5   Chloride 98 - 111 mmol/L 109  101  102   CO2 22 - 32 mmol/L 21  23  24    Calcium 8.9 - 10.3 mg/dL 7.9  8.1  8.0   Total Protein 6.5 - 8.1 g/dL 5.9     Total Bilirubin 0.0 - 1.2 mg/dL 0.4     Alkaline Phos 38 - 126 U/L 62     AST 15 - 41 U/L 21     ALT 0 - 44 U/L 18      Results for orders placed or performed during the hospital encounter of 10/14/23  Resp panel by RT-PCR (RSV, Flu A&B, Covid) Anterior Nasal Swab     Status: None   Collection Time: 10/14/23  8:56 PM   Specimen: Anterior Nasal Swab  Result Value Ref Range Status   SARS Coronavirus 2 by RT PCR NEGATIVE NEGATIVE Final    Comment: (NOTE) SARS-CoV-2 target nucleic acids are NOT DETECTED.  The SARS-CoV-2 RNA is generally detectable in upper respiratory specimens during the acute phase of infection. The lowest concentration of SARS-CoV-2 viral copies this assay can detect is 138 copies/mL. A negative result does not preclude SARS-Cov-2 infection and should not be used as the sole basis for treatment or other patient management decisions. A negative result may occur with  improper specimen collection/handling, submission of specimen other than nasopharyngeal  swab, presence of viral mutation(s) within the areas targeted by this assay, and inadequate number of viral copies(<138 copies/mL). A negative result must be combined with clinical observations, patient history, and epidemiological information. The expected result is Negative.  Fact Sheet for Patients:  BloggerCourse.com  Fact Sheet for Healthcare Providers:  SeriousBroker.it  This test is no t yet approved or cleared by the [place] FDA and  has been authorized for detection and/or diagnosis of SARS-CoV-2 by FDA under an Emergency Use Authorization (EUA). This EUA will remain  in effect (meaning this test can be used) for the duration of the COVID-19 declaration under Section 564(b)(1) of the Act, 21 U.S.C.section 360bbb-3(b)(1), unless the authorization is terminated  or revoked sooner.       Influenza A by PCR NEGATIVE NEGATIVE Final   Influenza B by PCR NEGATIVE NEGATIVE Final    Comment: (NOTE)  The Xpert Xpress SARS-CoV-2/FLU/RSV plus assay is intended as an aid in the diagnosis of influenza from Nasopharyngeal swab specimens and should not be used as a sole basis for treatment. Nasal washings and aspirates are unacceptable for Xpert Xpress SARS-CoV-2/FLU/RSV testing.  Fact Sheet for Patients: BloggerCourse.com  Fact Sheet for Healthcare Providers: SeriousBroker.it  This test is not yet approved or cleared by the [place] FDA and has been authorized for detection and/or diagnosis of SARS-CoV-2 by FDA under an Emergency Use Authorization (EUA). This EUA will remain in effect (meaning this test can be used) for the duration of the COVID-19 declaration under Section 564(b)(1) of the Act, 21 U.S.C. section 360bbb-3(b)(1), unless the authorization is terminated or revoked.     Resp Syncytial Virus by PCR NEGATIVE NEGATIVE Final    Comment: (NOTE) Fact Sheet for  Patients: BloggerCourse.com  Fact Sheet for Healthcare Providers: SeriousBroker.it  This test is not yet approved or cleared by the [place] FDA and has been authorized for detection and/or diagnosis of SARS-CoV-2 by FDA under an Emergency Use Authorization (EUA). This EUA will remain in effect (meaning this test can be used) for the duration of the COVID-19 declaration under Section 564(b)(1) of the Act, 21 U.S.C. section 360bbb-3(b)(1), unless the authorization is terminated or revoked.  Performed at [facility], [address] [place], [place] [address]   Blood culture (routine x 2)     Status: Abnormal (Preliminary result)   Collection Time: 10/14/23 11:03 PM   Specimen: BLOOD  Result Value Ref Range Status   Specimen Description   Final    BLOOD BLOOD RIGHT ARM Performed at [facility], [address], [place], [place] [address]    Special Requests   Final    BOTTLES DRAWN AEROBIC AND ANAEROBIC Blood Culture adequate volume Performed at [facility], [address], [place], [place] [address]    Culture  Setup Time   Final    GRAM POSITIVE COCCI ANAEROBIC BOTTLE ONLY Gram Stain Report Called to,Read Back By and Verified With: Wilburt Finlay, RN AT 1123 10/15/23 BY A. SNYDER AEROBIC BOTTLE ONLY GRAM POSITIVE COCCI GRAM STAIN REVIEWED-AGREE WITH RESULT CRITICAL RESULT CALLED TO, READ BACK BY AND VERIFIED WITH: RN RENEE TEJEDA ON 10/15/23 @ 1733 BY DRT     Culture (A)  Final    STAPHYLOCOCCUS AUREUS SUSCEPTIBILITIES TO FOLLOW Performed at [facility], [address], [place], [place] [address]    Report Status PENDING  Incomplete  Blood Culture ID Panel (Reflexed)     Status: Abnormal   Collection Time: 10/14/23 11:03 PM  Result Value Ref Range Status   Enterococcus faecalis NOT DETECTED NOT DETECTED Final   Enterococcus Faecium NOT DETECTED NOT DETECTED Final   Listeria monocytogenes NOT DETECTED NOT DETECTED  Final   Staphylococcus species DETECTED (A) NOT DETECTED Final    Comment: CRITICAL RESULT CALLED TO, READ BACK BY AND VERIFIED WITH: RN RENEE TEJEDA ON 10/15/23 @ 1733 BY DRT     Staphylococcus aureus (BCID) DETECTED (A) NOT DETECTED Final    Comment: CRITICAL RESULT CALLED TO, READ BACK BY AND VERIFIED WITH: RN RENEE TEJEDA ON 10/15/23 @ 1733 BY DRT     Staphylococcus epidermidis NOT DETECTED NOT DETECTED Final   Staphylococcus lugdunensis NOT DETECTED NOT DETECTED Final   Streptococcus species NOT DETECTED NOT DETECTED Final   Streptococcus agalactiae NOT DETECTED NOT DETECTED Final   Streptococcus pneumoniae NOT DETECTED NOT DETECTED Final   Streptococcus pyogenes NOT DETECTED NOT  DETECTED Final   A.calcoaceticus-baumannii NOT DETECTED NOT DETECTED Final   Bacteroides fragilis NOT DETECTED NOT DETECTED Final   Enterobacterales NOT DETECTED NOT DETECTED Final   Enterobacter cloacae complex NOT DETECTED NOT DETECTED Final   Escherichia coli NOT DETECTED NOT DETECTED Final   Klebsiella aerogenes NOT DETECTED NOT DETECTED Final   Klebsiella oxytoca NOT DETECTED NOT DETECTED Final   Klebsiella pneumoniae NOT DETECTED NOT DETECTED Final   Proteus species NOT DETECTED NOT DETECTED Final   Salmonella species NOT DETECTED NOT DETECTED Final   Serratia marcescens NOT DETECTED NOT DETECTED Final   Haemophilus influenzae NOT DETECTED NOT DETECTED Final   Neisseria meningitidis NOT DETECTED NOT DETECTED Final   Pseudomonas aeruginosa NOT DETECTED NOT DETECTED Final   Stenotrophomonas maltophilia NOT DETECTED NOT DETECTED Final   Candida albicans NOT DETECTED NOT DETECTED Final   Candida auris NOT DETECTED NOT DETECTED Final   Candida glabrata NOT DETECTED NOT DETECTED Final   Candida krusei NOT DETECTED NOT DETECTED Final   Candida parapsilosis NOT DETECTED NOT DETECTED Final   Candida tropicalis NOT DETECTED NOT DETECTED Final   Cryptococcus neoformans/gattii NOT DETECTED NOT DETECTED Final    Meth resistant mecA/C and MREJ NOT DETECTED NOT DETECTED Final    Comment: Performed at [facility], [address], [place], [place] [address]  Blood culture (routine x 2)     Status: Abnormal (Preliminary result)   Collection Time: 10/14/23 11:06 PM   Specimen: BLOOD  Result Value Ref Range Status   Specimen Description   Final    BLOOD BLOOD RIGHT ARM Performed at [facility], [address], [place], [place] [address]    Special Requests   Final    BOTTLES DRAWN AEROBIC AND ANAEROBIC Blood Culture adequate volume Performed at [facility], [address], [place], [place] [address]    Culture  Setup Time   Final    GRAM POSITIVE COCCI ANAEROBIC BOTTLE ONLY Gram Stain Report Called to,Read Back By and Verified With: Wilburt Finlay, RN AT 1123 10/15/23 BY A. SNYDER AEROBIC BOTTLE ONLY GRAM POSITIVE COCCI Performed at [facility], [address] [place], [place] [address]    Culture STAPHYLOCOCCUS AUREUS (A)  Final   Report Status PENDING  Incomplete  Culture, blood (single) w Reflex to ID Panel     Status: None (Preliminary result)   Collection Time: 10/15/23  5:41 AM   Specimen: BLOOD  Result Value Ref Range Status   Specimen Description BLOOD BLOOD RIGHT HAND  Final   Special Requests   Final    BOTTLES DRAWN AEROBIC ONLY Blood Culture adequate volume   Culture   Final    NO GROWTH 1 DAY Performed at [facility], [address], [place], [place] [address]    Report Status PENDING  Incomplete  Culture, blood (Routine X 2) w Reflex to ID Panel     Status: None (Preliminary result)   Collection Time: 10/16/23  4:08 AM   Specimen: BLOOD  Result Value Ref Range Status   Specimen Description BLOOD BLOOD LEFT HAND  Final   Special Requests   Final    BOTTLES DRAWN AEROBIC AND ANAEROBIC Blood Culture adequate volume   Culture   Final    NO GROWTH <12 HOURS Performed at [facility], [address] [place], [place] [address]    Report Status PENDING  Incomplete   Culture, blood (Routine X 2) w Reflex to ID Panel     Status: None (Preliminary result)  Collection Time: 10/16/23  4:08 AM   Specimen: BLOOD  Result Value Ref Range Status   Specimen Description BLOOD BLOOD LEFT WRIST  Final   Special Requests   Final    BOTTLES DRAWN AEROBIC AND ANAEROBIC Blood Culture adequate volume   Culture   Final    NO GROWTH <12 HOURS Performed at [facility], [address], [place], [place] [address]    Report Status PENDING  Incomplete   Plan  DC Vancomycin, ceftriaxone and doxycycline  Start cefazolin Needs TEE to r/o endocarditis  Monitor CBC, CMP on abtx Monitor for metastatic sites of infection D/w primary and ID pharm D  Odette Fraction, MD Infectious Disease Physician [facility] [address] [place], [place] [address] Phone: [phone]  Fax: [phone]

## 2023-10-16 NOTE — Progress Notes (Signed)
    [facility] has been requested to perform a transesophageal echocardiogram on Pam Soto for bacteremia.    The patient does NOT have any absolute or relative contraindications to a Transesophageal Echocardiogram (TEE).  The patient has: No other conditions that may impact this procedure. Labs today show Na+ at 134 and K+ 3.7. Hgb at 9.7 and platelets 182 K. 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   After careful review of history and examination, the risks and benefits of transesophageal echocardiogram have been explained including risks of esophageal damage, perforation (1:10,000 risk), bleeding, pharyngeal hematoma as well as other potential complications associated with conscious sedation including aspiration, arrhythmia, respiratory failure and death. Alternatives to treatment were discussed, questions were answered. Patient is willing to proceed.   Scheduled for 10/17/2023 at 1230 with Dr. Jenene Slicker. Echo and anesthesia aware.   Signed, Ellsworth Lennox, PA-C  10/16/2023 10:58 AM

## 2023-10-16 NOTE — Plan of Care (Signed)
  Problem: Activity: Goal: Risk for activity intolerance will decrease Outcome: Progressing   Problem: Nutrition: Goal: Adequate nutrition will be maintained Outcome: Progressing   Problem: Education: Goal: Knowledge of General Education information will improve Description: Including pain rating scale, medication(s)/side effects and non-pharmacologic comfort measures Outcome: Not Progressing   Problem: Health Behavior/Discharge Planning: Goal: Ability to manage health-related needs will improve Outcome: Not Progressing   Problem: Coping: Goal: Level of anxiety will decrease Outcome: Not Progressing   Problem: Pain Managment: Goal: General experience of comfort will improve and/or be controlled Outcome: Not Progressing

## 2023-10-16 NOTE — Plan of Care (Signed)

## 2023-10-16 NOTE — Progress Notes (Signed)
 PROGRESS NOTE    Pam Soto  JWJ:[id number] DOB: [birth year] DOA: 10/14/2023 PCP: Mechele Claude, MD  No chief complaint on file.   Hospital Course:  Pam Soto is 33 y.o. female with history of IV drug abuse who presents with right-sided chest pain and shortness of breath.  Patient endorses muscle cramps over the past few days which she believes are secondary to depleted electrolytes.  She reports her pleuritic chest pain has become severe and thus presented to the hospital.  She reports she has been trying to abstain from illicit substances and has been taking methadone.  She was concerned that her musculoskeletal pain was a result of withdrawal and she injected fentanyl yesterday.  On arrival to the ED patient was febrile, saturating the mid 90s on room air, tachypneic, tachycardic.  Labs reveal hyponatremia to 129, hypokalemia to 2.9, hypomagnesemia 1.6, leukocytosis 12.4 K.  Respiratory viral panel was negative.  Chest x-ray concerning for multifocal pneumonia.  CTA concerning for multifocal infiltrates which may be septic emboli.  Blood cultures were collected, the patient was started on IV fluids.  Subjective: No acute events overnight. No fever. On evaluation today patient complains over pain at the right rib cage still. Worse with deep inspiration.     Objective: Vitals:   10/15/23 2003 10/15/23 2159 10/16/23 0026 10/16/23 0354  BP: (!) 148/87 119/69  (!) 103/57  Pulse: (!) 113 100  97  Resp:      Temp: (!) 102.9 F (39.4 C) (!) 101.8 F (38.8 C) 99.2 F (37.3 C) 99 F (37.2 C)  TempSrc: Oral Oral Axillary Oral  SpO2: 96% 99%  96%  Weight:      Height:        Intake/Output Summary (Last 24 hours) at 10/16/2023 0756 Last data filed at 10/16/2023 0350 Gross per 24 hour  Intake 748.6 ml  Output --  Net 748.6 ml   Filed Weights   10/14/23 2300  Weight: 81.6 kg    Examination: General exam: Appears calm and uncomfortable.  Respiratory system: No work of breathing,  symmetric chest wall expansion Cardiovascular system: S1 & S2 heard, RRR.  Gastrointestinal system: Abdomen is nondistended, soft and nontender.  Neuro: Alert and oriented. No focal neurological deficits. Extremities: Symmetric, expected ROM Skin: No rashes, lesions Psychiatry: Demonstrates appropriate judgement and insight. Mood & affect appropriate for situation.   Assessment & Plan:  Principal Problem:   Pneumonia Active Problems:   Hyponatremia   Hypokalemia   Hypomagnesemia   Prolonged QT interval   Substance abuse (HCC)   Multifocal pneumonia    Sepsis -Criteria met on arrival leukocytosis, tachycardia, tachypnea.  Source: Pneumonia and bacteremia as above - On IV fluids - Broad-spectrum antibiotics - Cultures and pneumonia treatment as below  Multifocal pneumonia MSSA Bacteremia - Multifocal infiltrates noted in right lower lobe on CTA.  Concerning for septic emboli - Cultures now positive for MSSA - Sputum culture ordered -- Encouraged IS and FV - MRSA PCR, follow - HIV negative - Have consulted infectious disease - Echocardiogram: Without evidence of endocarditis.  Will likely need TEE. -- Cards to take for TEE tomorrow - Abx have been deescalated to cefazolin  IV drug abuse - Patient reports she has been working towards abstinence and is on methadone.  Did use IV fentanyl yesterday -- Resume home dose methadone  - TOC consulted for resource assistance  Right side Pleuritis -- Cont methadone, Toradol, lidocaine patches. PRN Oxy. Try to avoid additional narcotics.  Hypokalemia  Hypomagnesemia - Replace as needed - Follow BMP  Prolonged QT - Continue to optimize electrolytes, avoid QT prolonging meds - Repeat EKG 3/5 shows Qtc improved to normal  Hyponatremia - Continue IV hydration with NS  DVT prophylaxis: Lovenox   Code Status: Full Code Family Communication:  Discussed directly with patient Disposition:  Inpatient, still hospitalized for IV  abx, TEE tomorrow, may need prolonged course of IV Abx, not a PICC candidate. Will discharge to home when medically stable.  Consultants:  Treatment Team:  Consulting Physician: Odette Fraction, MD  Procedures:    Antimicrobials:  Anti-infectives (From admission, onward)    Start     Dose/Rate Route Frequency Ordered Stop   10/15/23 1000  vancomycin (VANCOCIN) IVPB 1000 mg/200 mL premix        1,000 mg 200 mL/hr over 60 Minutes Intravenous Every 12 hours 10/15/23 0406     10/15/23 0600  cefTRIAXone (ROCEPHIN) 2 g in sodium chloride 0.9 % 100 mL IVPB        2 g 200 mL/hr over 30 Minutes Intravenous Every 24 hours 10/15/23 0354 10/20/23 0559   10/15/23 0400  doxycycline (VIBRAMYCIN) 100 mg in sodium chloride 0.9 % 250 mL IVPB        100 mg 125 mL/hr over 120 Minutes Intravenous Every 12 hours 10/15/23 0354     10/14/23 2315  ceFEPIme (MAXIPIME) 2 g in sodium chloride 0.9 % 100 mL IVPB        2 g 200 mL/hr over 30 Minutes Intravenous  Once 10/14/23 2303 10/15/23 0109   10/14/23 2315  Vancomycin (VANCOCIN) 1,500 mg in sodium chloride 0.9 % 500 mL IVPB        1,500 mg 250 mL/hr over 120 Minutes Intravenous  Once 10/14/23 2304 10/15/23 0228       Data Reviewed: I have personally reviewed following labs and imaging studies CBC: Recent Labs  Lab 10/14/23 2138 10/15/23 0519 10/16/23 0408  WBC 12.4* 11.0* 11.0*  NEUTROABS  --   --  7.5  HGB 12.1 10.3* 9.7*  HCT 35.8* 31.1* 30.5*  MCV 86.7 88.6 90.8  PLT 222 191 182   Basic Metabolic Panel: Recent Labs  Lab 10/14/23 2138 10/15/23 0519 10/15/23 1549 10/16/23 0408  NA 129* 134* 132* 134*  K 2.9* 2.5* 4.2 3.7  CL 97* 102 101 109  CO2 20* 24 23 21*  GLUCOSE 105* 114* 109* 114*  BUN 15 14 12 8   CREATININE 1.01* 0.77 0.59 0.60  CALCIUM 8.2* 8.0* 8.1* 7.9*  MG 1.6*  --   --  1.9  PHOS  --   --   --  2.3*   GFR: Estimated Creatinine Clearance: 113 mL/min (by C-G formula based on SCr of 0.6 mg/dL). Liver Function  Tests: Recent Labs  Lab 10/14/23 2138 10/16/23 0408  AST 22 21  ALT 22 18  ALKPHOS 62 62  BILITOT 0.9 0.4  PROT 7.6 5.9*  ALBUMIN 3.6 2.5*   CBG: No results for input(s): "GLUCAP" in the last 168 hours.  Recent Results (from the past 240 hours)  Resp panel by RT-PCR (RSV, Flu A&B, Covid) Anterior Nasal Swab     Status: None   Collection Time: 10/14/23  8:56 PM   Specimen: Anterior Nasal Swab  Result Value Ref Range Status   SARS Coronavirus 2 by RT PCR NEGATIVE NEGATIVE Final    Comment: (NOTE) SARS-CoV-2 target nucleic acids are NOT DETECTED.  The SARS-CoV-2 RNA is generally detectable in upper respiratory  specimens during the acute phase of infection. The lowest concentration of SARS-CoV-2 viral copies this assay can detect is 138 copies/mL. A negative result does not preclude SARS-Cov-2 infection and should not be used as the sole basis for treatment or other patient management decisions. A negative result may occur with  improper specimen collection/handling, submission of specimen other than nasopharyngeal swab, presence of viral mutation(s) within the areas targeted by this assay, and inadequate number of viral copies(<138 copies/mL). A negative result must be combined with clinical observations, patient history, and epidemiological information. The expected result is Negative.  Fact Sheet for Patients:  BloggerCourse.com  Fact Sheet for Healthcare Providers:  SeriousBroker.it  This test is no t yet approved or cleared by the [place] FDA and  has been authorized for detection and/or diagnosis of SARS-CoV-2 by FDA under an Emergency Use Authorization (EUA). This EUA will remain  in effect (meaning this test can be used) for the duration of the COVID-19 declaration under Section 564(b)(1) of the Act, 21 U.S.C.section 360bbb-3(b)(1), unless the authorization is terminated  or revoked sooner.       Influenza  A by PCR NEGATIVE NEGATIVE Final   Influenza B by PCR NEGATIVE NEGATIVE Final    Comment: (NOTE) The Xpert Xpress SARS-CoV-2/FLU/RSV plus assay is intended as an aid in the diagnosis of influenza from Nasopharyngeal swab specimens and should not be used as a sole basis for treatment. Nasal washings and aspirates are unacceptable for Xpert Xpress SARS-CoV-2/FLU/RSV testing.  Fact Sheet for Patients: BloggerCourse.com  Fact Sheet for Healthcare Providers: SeriousBroker.it  This test is not yet approved or cleared by the [place] FDA and has been authorized for detection and/or diagnosis of SARS-CoV-2 by FDA under an Emergency Use Authorization (EUA). This EUA will remain in effect (meaning this test can be used) for the duration of the COVID-19 declaration under Section 564(b)(1) of the Act, 21 U.S.C. section 360bbb-3(b)(1), unless the authorization is terminated or revoked.     Resp Syncytial Virus by PCR NEGATIVE NEGATIVE Final    Comment: (NOTE) Fact Sheet for Patients: BloggerCourse.com  Fact Sheet for Healthcare Providers: SeriousBroker.it  This test is not yet approved or cleared by the [place] FDA and has been authorized for detection and/or diagnosis of SARS-CoV-2 by FDA under an Emergency Use Authorization (EUA). This EUA will remain in effect (meaning this test can be used) for the duration of the COVID-19 declaration under Section 564(b)(1) of the Act, 21 U.S.C. section 360bbb-3(b)(1), unless the authorization is terminated or revoked.  Performed at [facility], [address], [place], [place] [address]   Blood culture (routine x 2)     Status: None (Preliminary result)   Collection Time: 10/14/23 11:03 PM   Specimen: BLOOD  Result Value Ref Range Status   Specimen Description   Final    BLOOD BLOOD RIGHT ARM Performed at [facility], [address] [place], [place] [address]    Special Requests   Final    BOTTLES DRAWN AEROBIC AND ANAEROBIC Blood Culture adequate volume Performed at [facility], [address] [place], [place] [address]    Culture  Setup Time   Final    GRAM POSITIVE COCCI ANAEROBIC BOTTLE ONLY Gram Stain Report Called to,Read Back By and Verified With: Wilburt Finlay, RN AT 1123 10/15/23 BY A. SNYDER AEROBIC BOTTLE ONLY GRAM POSITIVE COCCI GRAM STAIN REVIEWED-AGREE WITH RESULT CRITICAL RESULT CALLED TO, READ BACK BY AND VERIFIED WITH: RN RENEE TEJEDA ON 10/15/23 @  1733 BY DRT  Performed at [facility], [address], [place], [place] [address]    Culture GRAM POSITIVE COCCI  Final   Report Status PENDING  Incomplete  Blood Culture ID Panel (Reflexed)     Status: Abnormal   Collection Time: 10/14/23 11:03 PM  Result Value Ref Range Status   Enterococcus faecalis NOT DETECTED NOT DETECTED Final   Enterococcus Faecium NOT DETECTED NOT DETECTED Final   Listeria monocytogenes NOT DETECTED NOT DETECTED Final   Staphylococcus species DETECTED (A) NOT DETECTED Final    Comment: CRITICAL RESULT CALLED TO, READ BACK BY AND VERIFIED WITH: RN RENEE TEJEDA ON 10/15/23 @ 1733 BY DRT     Staphylococcus aureus (BCID) DETECTED (A) NOT DETECTED Final    Comment: CRITICAL RESULT CALLED TO, READ BACK BY AND VERIFIED WITH: RN RENEE TEJEDA ON 10/15/23 @ 1733 BY DRT     Staphylococcus epidermidis NOT DETECTED NOT DETECTED Final   Staphylococcus lugdunensis NOT DETECTED NOT DETECTED Final   Streptococcus species NOT DETECTED NOT DETECTED Final   Streptococcus agalactiae NOT DETECTED NOT DETECTED Final   Streptococcus pneumoniae NOT DETECTED NOT DETECTED Final   Streptococcus pyogenes NOT DETECTED NOT DETECTED Final   A.calcoaceticus-baumannii NOT DETECTED NOT DETECTED Final   Bacteroides fragilis NOT DETECTED NOT DETECTED Final   Enterobacterales NOT DETECTED NOT DETECTED Final   Enterobacter cloacae complex NOT DETECTED NOT  DETECTED Final   Escherichia coli NOT DETECTED NOT DETECTED Final   Klebsiella aerogenes NOT DETECTED NOT DETECTED Final   Klebsiella oxytoca NOT DETECTED NOT DETECTED Final   Klebsiella pneumoniae NOT DETECTED NOT DETECTED Final   Proteus species NOT DETECTED NOT DETECTED Final   Salmonella species NOT DETECTED NOT DETECTED Final   Serratia marcescens NOT DETECTED NOT DETECTED Final   Haemophilus influenzae NOT DETECTED NOT DETECTED Final   Neisseria meningitidis NOT DETECTED NOT DETECTED Final   Pseudomonas aeruginosa NOT DETECTED NOT DETECTED Final   Stenotrophomonas maltophilia NOT DETECTED NOT DETECTED Final   Candida albicans NOT DETECTED NOT DETECTED Final   Candida auris NOT DETECTED NOT DETECTED Final   Candida glabrata NOT DETECTED NOT DETECTED Final   Candida krusei NOT DETECTED NOT DETECTED Final   Candida parapsilosis NOT DETECTED NOT DETECTED Final   Candida tropicalis NOT DETECTED NOT DETECTED Final   Cryptococcus neoformans/gattii NOT DETECTED NOT DETECTED Final   Meth resistant mecA/C and MREJ NOT DETECTED NOT DETECTED Final    Comment: Performed at [facility], [address], [place], [place] [address]  Blood culture (routine x 2)     Status: None (Preliminary result)   Collection Time: 10/14/23 11:06 PM   Specimen: BLOOD  Result Value Ref Range Status   Specimen Description BLOOD BLOOD RIGHT ARM  Final   Special Requests   Final    BOTTLES DRAWN AEROBIC AND ANAEROBIC Blood Culture adequate volume   Culture  Setup Time   Final    GRAM POSITIVE COCCI ANAEROBIC BOTTLE ONLY Gram Stain Report Called to,Read Back By and Verified With: Wilburt Finlay, RN AT 1123 10/15/23 BY A. SNYDER AEROBIC BOTTLE ONLY GRAM POSITIVE COCCI Performed at [facility], [address] [place], [place] [address]    Culture [facility] POSITIVE COCCI  Final   Report Status PENDING  Incomplete  Culture, blood (single) w Reflex to ID Panel     Status: None (Preliminary result)   Collection  Time: 10/15/23  5:41 AM   Specimen: BLOOD  Result Value Ref Range  Status   Specimen Description BLOOD BLOOD RIGHT HAND  Final   Special Requests   Final    BOTTLES DRAWN AEROBIC ONLY Blood Culture adequate volume   Culture   Final    NO GROWTH 1 DAY Performed at [facility], [address] [place], [place] [address]    Report Status PENDING  Incomplete  Culture, blood (Routine X 2) w Reflex to ID Panel     Status: None (Preliminary result)   Collection Time: 10/16/23  4:08 AM   Specimen: BLOOD  Result Value Ref Range Status   Specimen Description BLOOD BLOOD LEFT HAND  Final   Special Requests   Final    BOTTLES DRAWN AEROBIC AND ANAEROBIC Blood Culture adequate volume   Culture   Final    NO GROWTH <12 HOURS Performed at [facility], [address], [place], [place] [address]    Report Status PENDING  Incomplete  Culture, blood (Routine X 2) w Reflex to ID Panel     Status: None (Preliminary result)   Collection Time: 10/16/23  4:08 AM   Specimen: BLOOD  Result Value Ref Range Status   Specimen Description BLOOD BLOOD LEFT WRIST  Final   Special Requests   Final    BOTTLES DRAWN AEROBIC AND ANAEROBIC Blood Culture adequate volume   Culture   Final    NO GROWTH <12 HOURS Performed at [facility], [address], [place], [place] [address]    Report Status PENDING  Incomplete     Radiology Studies: ECHOCARDIOGRAM COMPLETE Result Date: 10/15/2023    ECHOCARDIOGRAM REPORT   Patient Name:   Pam Soto Date of Exam: 10/15/2023 Medical Rec #:  [id number]      Height:       67.0 in Accession #:    [id number]     Weight:       180.0 lb Date of Birth:  1990/10/24       BSA:          1.934 m Patient Age:    32 years       BP:           106/72 mmHg Patient Gender: F              HR:           88 bpm. [facility] Procedure: 2D Echo, Cardiac Doppler and Color Doppler (Both Spectral and Color            Flow Doppler were utilized during procedure). Indications:    Septic  embolism [facility]) [[id number]]  History:        Patient has no prior history of Echocardiogram examinations. IV                 drug Use.  Sonographer:    Webb Laws Referring Phys: [id number] TIMOTHY S OPYD IMPRESSIONS  1. Left ventricular ejection fraction, by estimation, is 60 to 65%. The left ventricle has normal function. The left ventricle has no regional wall motion abnormalities. There is mild left ventricular hypertrophy. Left ventricular diastolic parameters were normal.  2. Right ventricular systolic function is normal. The right ventricular size is normal.  3. The mitral valve is normal in structure. Mild mitral valve regurgitation.  4. The aortic valve is normal in structure. Aortic valve regurgitation is not visualized.  5. The inferior vena cava is dilated in size with <50% respiratory variability, suggesting right atrial pressure of 15 mmHg. FINDINGS  Left  Ventricle: Left ventricular ejection fraction, by estimation, is 60 to 65%. The left ventricle has normal function. The left ventricle has no regional wall motion abnormalities. The left ventricular internal cavity size was normal in size. There is  mild left ventricular hypertrophy. Left ventricular diastolic parameters were normal. Right Ventricle: The right ventricular size is normal. Right vetricular wall thickness was not assessed. Right ventricular systolic function is normal. Left Atrium: Left atrial size was normal in size. Right Atrium: Right atrial size was normal in size. Pericardium: There is no evidence of pericardial effusion. Mitral Valve: The mitral valve is normal in structure. Mild mitral valve regurgitation. Tricuspid Valve: The tricuspid valve is normal in structure. Tricuspid valve regurgitation is trivial. Aortic Valve: The aortic valve is normal in structure. Aortic valve regurgitation is not visualized. Pulmonic Valve: The pulmonic valve was normal in structure. Pulmonic valve regurgitation is not visualized. Aorta: The aortic  root and ascending aorta are structurally normal, with no evidence of dilitation. Venous: The inferior vena cava is dilated in size with less than 50% respiratory variability, suggesting right atrial pressure of 15 mmHg. IAS/Shunts: No atrial level shunt detected by color flow Doppler.  LEFT VENTRICLE PLAX 2D LVIDd:         4.80 cm   Diastology LVIDs:         3.40 cm   LV e' medial:    11.70 cm/s LV PW:         1.20 cm   LV E/e' medial:  7.6 LV IVS:        1.00 cm   LV e' lateral:   17.00 cm/s LVOT diam:     2.10 cm   LV E/e' lateral: 5.2 LV SV:         65 LV SV Index:   34 LVOT Area:     3.46 cm  RIGHT VENTRICLE             IVC RV Basal diam:  4.10 cm     IVC diam: 3.20 cm RV S prime:     14.20 cm/s TAPSE (M-mode): 2.3 cm LEFT ATRIUM             Index        RIGHT ATRIUM           Index LA diam:        3.20 cm 1.65 cm/m   RA Area:     13.50 cm LA Vol (A2C):   31.2 ml 16.13 ml/m  RA Volume:   34.60 ml  17.89 ml/m LA Vol (A4C):   39.6 ml 20.48 ml/m LA Biplane Vol: 37.2 ml 19.24 ml/m  AORTIC VALVE LVOT Vmax:   120.00 cm/s LVOT Vmean:  77.600 cm/s LVOT VTI:    0.188 m  AORTA Ao Root diam: 2.50 cm Ao Asc diam:  2.60 cm MITRAL VALVE MV Area (PHT): 3.77 cm    SHUNTS MV Decel Time: 201 msec    Systemic VTI:  0.19 m MV E velocity: 89.20 cm/s  Systemic Diam: 2.10 cm MV A velocity: 60.20 cm/s MV E/A ratio:  1.48 Dietrich Pates MD Electronically signed by Dietrich Pates MD Signature Date/Time: 10/15/2023/3:03:21 PM    Final    CT Angio Chest PE W and/or Wo Contrast Addendum Date: 10/15/2023 ADDENDUM REPORT: 10/15/2023 01:21 ADDENDUM: Upon further evaluation and receipt of additional patient history, the previously noted multifocal infiltrates seen within the right lobe likely represents sequelae associated with septic emboli. Electronically Signed   By:  Aram Candela M.D.   On: 10/15/2023 01:21   Result Date: 10/15/2023 CLINICAL DATA:  Right-sided chest pain with shortness of breath and leg cramping. EXAM: CT ANGIOGRAPHY  CHEST WITH CONTRAST TECHNIQUE: Multidetector CT imaging of the chest was performed using the standard protocol during bolus administration of intravenous contrast. Multiplanar CT image reconstructions and MIPs were obtained to evaluate the vascular anatomy. RADIATION DOSE REDUCTION: This exam was performed according to the departmental dose-optimization program which includes automated exposure control, adjustment of the mA and/or kV according to patient size and/or use of iterative reconstruction technique. CONTRAST:  75mL OMNIPAQUE IOHEXOL 350 MG/ML SOLN COMPARISON:  None Available. FINDINGS: Cardiovascular: The thoracic aorta is normal in appearance. Satisfactory opacification of the pulmonary arteries to the segmental level. No evidence of pulmonary embolism. Normal heart size. No pericardial effusion. Mediastinum/Nodes: Mild right hilar lymphadenopathy is suspected. Thyroid gland, trachea, and esophagus demonstrate no significant findings. Lungs/Pleura: Mild multifocal infiltrates are seen with within the right lower lobe 0. No pleural effusion or pneumothorax is identified. Upper Abdomen: No acute abnormality. Musculoskeletal: No chest wall abnormality. No acute or significant osseous findings. Review of the MIP images confirms the above findings. IMPRESSION: 1. No evidence of pulmonary embolism. 2. Mild multifocal right lower lobe infiltrates. Electronically Signed: By: Aram Candela M.D. On: 10/15/2023 00:56   DG Chest 2 View Result Date: 10/14/2023 CLINICAL DATA:  Chest pain and shortness of breath EXAM: CHEST - 2 VIEW COMPARISON:  01/18/2023 FINDINGS: Cardiac shadow is within normal limits. Lungs are well aerated bilaterally. Mild right lower lobe opacity is noted anteriorly consistent with early infiltrate. No effusion is seen. No bony abnormality is noted. IMPRESSION: Mild right lower lobe infiltrate. Electronically Signed   By: Alcide Clever M.D.   On: 10/14/2023 22:58    Scheduled Meds:   enoxaparin (LOVENOX) injection  40 mg Subcutaneous Q24H   methadone  40 mg Oral Daily   nicotine  21 mg Transdermal Daily   pantoprazole  40 mg Oral Daily   sodium chloride flush  3 mL Intravenous Q12H   Continuous Infusions:  cefTRIAXone (ROCEPHIN)  IV Stopped (10/15/23 1012)   doxycycline (VIBRAMYCIN) IV 100 mg (10/16/23 0350)   vancomycin Stopped (10/15/23 2300)     LOS: 1 day    Total time spent interpreting labs and vitals, coordinating care amongst consultants and care team members, directly assessing and discussing care with the patient and/or family: 55 min   Debarah Crape, DO [facility]  To contact the attending physician between 7A-7P please use Epic Chat. To contact the covering physician during after hours 7P-7A, please review Amion.   10/16/2023, 7:56 AM   *This document has been created with the assistance of dictation software. Please excuse typographical errors. *

## 2023-10-17 ENCOUNTER — Other Ambulatory Visit (HOSPITAL_COMMUNITY): Payer: Self-pay | Admitting: *Deleted

## 2023-10-17 ENCOUNTER — Inpatient Hospital Stay (HOSPITAL_COMMUNITY): Payer: MEDICAID

## 2023-10-17 ENCOUNTER — Encounter (HOSPITAL_COMMUNITY): Payer: Self-pay | Admitting: Family Medicine

## 2023-10-17 ENCOUNTER — Inpatient Hospital Stay (HOSPITAL_COMMUNITY): Payer: MEDICAID | Admitting: Anesthesiology

## 2023-10-17 ENCOUNTER — Encounter (HOSPITAL_COMMUNITY)
Admission: EM | Disposition: A | Discharge status: Home / Self Care | Payer: Self-pay | Source: Home / Self Care | Attending: Family Medicine

## 2023-10-17 DIAGNOSIS — R7881 Bacteremia: Secondary | ICD-10-CM

## 2023-10-17 DIAGNOSIS — F1910 Other psychoactive substance abuse, uncomplicated: Secondary | ICD-10-CM | POA: Diagnosis not present

## 2023-10-17 DIAGNOSIS — I10 Essential (primary) hypertension: Secondary | ICD-10-CM

## 2023-10-17 DIAGNOSIS — F17210 Nicotine dependence, cigarettes, uncomplicated: Secondary | ICD-10-CM | POA: Diagnosis not present

## 2023-10-17 DIAGNOSIS — R9431 Abnormal electrocardiogram [ECG] [EKG]: Secondary | ICD-10-CM | POA: Diagnosis not present

## 2023-10-17 DIAGNOSIS — F418 Other specified anxiety disorders: Secondary | ICD-10-CM

## 2023-10-17 DIAGNOSIS — E876 Hypokalemia: Secondary | ICD-10-CM | POA: Diagnosis not present

## 2023-10-17 DIAGNOSIS — B9561 Methicillin susceptible Staphylococcus aureus infection as the cause of diseases classified elsewhere: Secondary | ICD-10-CM | POA: Diagnosis present

## 2023-10-17 DIAGNOSIS — R9430 Abnormal result of cardiovascular function study, unspecified: Secondary | ICD-10-CM

## 2023-10-17 LAB — COMPREHENSIVE METABOLIC PANEL
ALT: 13 U/L (ref 0–44)
AST: 17 U/L (ref 15–41)
Albumin: 2.2 g/dL — ABNORMAL LOW (ref 3.5–5.0)
Alkaline Phosphatase: 58 U/L (ref 38–126)
Anion gap: 6 (ref 5–15)
BUN: 6 mg/dL (ref 6–20)
CO2: 24 mmol/L (ref 22–32)
Calcium: 7.8 mg/dL — ABNORMAL LOW (ref 8.9–10.3)
Chloride: 107 mmol/L (ref 98–111)
Creatinine, Ser: 0.55 mg/dL (ref 0.44–1.00)
GFR, Estimated: >60 mL/min (ref >60)
Glucose, Bld: 96 mg/dL (ref 70–99)
Potassium: 3.5 mmol/L (ref 3.5–5.1)
Sodium: 137 mmol/L (ref 135–145)
Total Bilirubin: <0.2 mg/dL (ref 0.0–1.2)
Total Protein: 5.6 g/dL — ABNORMAL LOW (ref 6.5–8.1)

## 2023-10-17 LAB — CBC WITH DIFFERENTIAL/PLATELET
Abs Immature Granulocytes: 0.07 10*3/uL (ref 0.00–0.07)
Basophils Absolute: 0 10*3/uL (ref 0.0–0.1)
Basophils Relative: 0 %
Eosinophils Absolute: 0.2 10*3/uL (ref 0.0–0.5)
Eosinophils Relative: 2 %
HCT: 29.7 % — ABNORMAL LOW (ref 36.0–46.0)
Hemoglobin: 9.4 g/dL — ABNORMAL LOW (ref 12.0–15.0)
Immature Granulocytes: 1 %
Lymphocytes Relative: 28 %
Lymphs Abs: 2.4 10*3/uL (ref 0.7–4.0)
MCH: 28.9 pg (ref 26.0–34.0)
MCHC: 31.6 g/dL (ref 30.0–36.0)
MCV: 91.4 fL (ref 80.0–100.0)
Monocytes Absolute: 0.7 10*3/uL (ref 0.1–1.0)
Monocytes Relative: 8 %
Neutro Abs: 5.1 10*3/uL (ref 1.7–7.7)
Neutrophils Relative %: 61 %
Platelets: 215 10*3/uL (ref 150–400)
RBC: 3.25 MIL/uL — ABNORMAL LOW (ref 3.87–5.11)
RDW: 13.2 % (ref 11.5–15.5)
WBC: 8.4 10*3/uL (ref 4.0–10.5)
nRBC: 0 % (ref 0.0–0.2)

## 2023-10-17 LAB — CULTURE, BLOOD (ROUTINE X 2)
Special Requests: ADEQUATE
Special Requests: ADEQUATE

## 2023-10-17 LAB — HEPATITIS B SURFACE ANTIBODY, QUANTITATIVE: Hep B S AB Quant (Post): <3.5 m[IU]/mL — ABNORMAL LOW

## 2023-10-17 LAB — ECHO TEE

## 2023-10-17 LAB — PHOSPHORUS: Phosphorus: 3.3 mg/dL (ref 2.5–4.6)

## 2023-10-17 LAB — HEPATITIS B CORE ANTIBODY, TOTAL: HEP B CORE AB: NEGATIVE

## 2023-10-17 LAB — MAGNESIUM: Magnesium: 1.7 mg/dL (ref 1.7–2.4)

## 2023-10-17 SURGERY — ECHOCARDIOGRAM, TRANSESOPHAGEAL
Anesthesia: General

## 2023-10-17 MED ORDER — KETAMINE HCL 10 MG/ML IJ SOLN
INTRAMUSCULAR | Status: DC | PRN
  Administered 2023-10-17: 20 mg via INTRAVENOUS
  Administered 2023-10-17: 30 mg via INTRAVENOUS

## 2023-10-17 MED ORDER — MIDAZOLAM HCL 2 MG/2ML IJ SOLN
INTRAMUSCULAR | Status: AC
  Filled 2023-10-17: qty 2

## 2023-10-17 MED ORDER — LACTATED RINGERS IV SOLN: INTRAVENOUS | Status: DC | PRN

## 2023-10-17 MED ORDER — KETAMINE HCL 50 MG/5ML IJ SOSY
PREFILLED_SYRINGE | INTRAMUSCULAR | Status: AC
  Filled 2023-10-17: qty 5

## 2023-10-17 MED ORDER — PROPOFOL 500 MG/50ML IV EMUL
INTRAVENOUS | Status: DC | PRN
  Administered 2023-10-17: 200 ug/kg/min via INTRAVENOUS

## 2023-10-17 MED ORDER — MIDAZOLAM HCL 2 MG/2ML IJ SOLN
INTRAMUSCULAR | Status: DC | PRN
  Administered 2023-10-17: 2 mg via INTRAVENOUS

## 2023-10-17 MED ORDER — PROPOFOL 10 MG/ML IV BOLUS
INTRAVENOUS | Status: DC | PRN
  Administered 2023-10-17: 100 mg via INTRAVENOUS

## 2023-10-17 MED ORDER — BUTAMBEN-TETRACAINE-BENZOCAINE 2-2-14 % EX AERO
INHALATION_SPRAY | CUTANEOUS | Status: AC
Start: 2023-10-17 — End: ?
  Filled 2023-10-17: qty 5

## 2023-10-17 MED ORDER — PHENYLEPHRINE HCL (PRESSORS) 10 MG/ML IV SOLN
INTRAVENOUS | Status: DC | PRN
  Administered 2023-10-17: 160 ug via INTRAVENOUS

## 2023-10-17 NOTE — H&P (Signed)
 CARDIOLOGY PRE=PROCEDURE HISTORY AND PHYSICAL NOTE    Patient ID: Pam Soto; [id number]; [birth year]   Admit date: 10/14/2023 Date of Consult: 10/17/2023  Primary Care Provider: Mechele Claude, MD Primary Cardiologist:  Primary Electrophysiologist:     History of Present Illness:   Pam Soto is a 33 y/o F with substance abuse is currently admitted to the medicine team for the management of MSSA septicemia and multifocal pneumonia. TTE showed no evidence of vegetations. Stable for TEE today.  Past Medical History:  Diagnosis Date   Allergy    Anemia    Cough 05/04/2015   Depression    Non-restorable tooth 04/2015   teeth   PTSD (post-traumatic stress disorder)    Substance abuse (HCC)    Swelling of gums 04/2015    Past Surgical History:  Procedure Laterality Date   MULTIPLE EXTRACTIONS WITH ALVEOLOPLASTY N/A 05/08/2015   Procedure: MULTIPLE TEETH EXTRACTIONS WITH ALVEOLOPLASTY;  Surgeon: Ocie Doyne, DDS;  Location: [facility];  Service: Oral Surgery;  Laterality: N/A;   TONSILLECTOMY     TONSILLECTOMY AND ADENOIDECTOMY  02/19/2001       Inpatient Medications: Scheduled Meds:  [MAR Hold] enoxaparin (LOVENOX) injection  40 mg Subcutaneous Q24H   [MAR Hold] gabapentin  200 mg Oral TID   [MAR Hold] ketorolac  30 mg Intravenous Q8H   [MAR Hold] lidocaine  1 patch Transdermal Q24H   [MAR Hold] methadone  40 mg Oral Daily   [MAR Hold] nicotine  21 mg Transdermal Daily   [MAR Hold] pantoprazole  40 mg Oral Daily   [MAR Hold] sodium chloride flush  3 mL Intravenous Q12H   Continuous Infusions:  [MAR Hold]  ceFAZolin (ANCEF) IV 2 g (10/17/23 0559)   PRN Meds: [MAR Hold] acetaminophen **OR** [MAR Hold] acetaminophen, [MAR Hold] guaiFENesin-dextromethorphan, [MAR Hold] ketorolac, [MAR Hold] oxyCODONE, [MAR Hold] senna-docusate, [MAR Hold] trimethobenzamide  Allergies:    Allergies  Allergen Reactions   Tramadol Hives    Social History:   Social  History   Socioeconomic History   Marital status: Single    Spouse name: Not on file   Number of children: Not on file   Years of education: Not on file   Highest education level: Not on file  Occupational History   Not on file  Tobacco Use   Smoking status: Every Day    Current packs/day: 1.00    Average packs/day: 1 pack/day for 5.0 years (5.0 ttl pk-yrs)    Types: Cigarettes   Smokeless tobacco: Never  Vaping Use   Vaping status: Never Used  Substance and Sexual Activity   Alcohol use: No   Drug use: Not Currently    Types: Cocaine    Comment: percocet   Sexual activity: Yes    Birth control/protection: None  Other Topics Concern   Not on file  Social History Narrative   Not on file   Social Drivers of Health   Financial Resource Strain: Medium Risk (02/18/2023)   Received from [facility]   Overall Financial Resource Strain (CARDIA)    Difficulty of Paying Living Expenses: Somewhat hard  Food Insecurity: No Food Insecurity (10/15/2023)   Hunger Vital Sign    Worried About Running Out of Food in the Last Year: Never true    Ran Out of Food in the Last Year: Never true  Transportation Needs: Unmet Transportation Needs (10/15/2023)   PRAPARE - Administrator, Civil Service (Medical): Yes  Lack of Transportation (Non-Medical): Yes  Physical Activity: Not on file  Stress: Not on file  Social Connections: Moderately Integrated (10/15/2023)   Social Connection and Isolation Panel [NHANES]    Frequency of Communication with Friends and Family: Three times a week    Frequency of Social Gatherings with Friends and Family: Three times a week    Attends Religious Services: 1 to 4 times per year    Active Member of Clubs or Organizations: No    Attends Banker Meetings: Never    Marital Status: Living with partner  Intimate Partner Violence: Not At Risk (10/15/2023)   Humiliation, Afraid, Rape, and Kick questionnaire    Fear of Current or Ex-Partner:  No    Emotionally Abused: No    Physically Abused: No    Sexually Abused: No    Family History:    Family History  Problem Relation Age of Onset   Depression Mother    Drug abuse Mother    Alcohol abuse Father    Cirrhosis Father      ROS:  Please see the history of present illness.  ROS  All other ROS reviewed and negative.     Physical Exam/Data:   Vitals:   10/16/23 2225 10/17/23 0509 10/17/23 0608 10/17/23 1151  BP: 127/63 (!) 124/98  117/80  Pulse: 89 (!) 103  74  Resp: 20 (!) 25 18 20   Temp: 99 F (37.2 C) (!) 101.1 F (38.4 C) 98.8 F (37.1 C) 98.6 F (37 C)  TempSrc: Oral Oral  Oral  SpO2: 98% 96%  97%  Weight:    81.6 kg  Height:    5\' 7"  (1.702 m)    Intake/Output Summary (Last 24 hours) at 10/17/2023 1346 Last data filed at 10/17/2023 1336 Gross per 24 hour  Intake 852.42 ml  Output --  Net 852.42 ml   Filed Weights   10/14/23 2300 10/17/23 1151  Weight: 81.6 kg 81.6 kg   Body mass index is 28.19 kg/m.  General:  Well nourished, well developed, in no acute distress HEENT: normal Lymph: no adenopathy Neck: no JVD Endocrine:  No thryomegaly Vascular: No carotid bruits; FA pulses 2+ bilaterally without bruits  Cardiac:  normal S1, S2; RRR; no murmur Lungs:  clear to auscultation bilaterally, no wheezing, rhonchi or rales  Abd: soft, nontender, no hepatomegaly  Ext: no edema Musculoskeletal:  No deformities, BUE and BLE strength normal and equal Skin: warm and dry  Neuro:  CNs 2-12 intact, no focal abnormalities noted Psych:  Normal affect    Laboratory Data:  Chemistry Recent Labs  Lab 10/15/23 1549 10/16/23 0408 10/17/23 0316  NA 132* 134* 137  K 4.2 3.7 3.5  CL 101 109 107  CO2 23 21* 24  GLUCOSE 109* 114* 96  BUN 12 8 6   CREATININE 0.59 0.60 0.55  CALCIUM 8.1* 7.9* 7.8*  GFRNONAA >60 >60 >60  ANIONGAP 8 4* 6    Recent Labs  Lab 10/14/23 2138 10/16/23 0408 10/17/23 0316  PROT 7.6 5.9* 5.6*  ALBUMIN 3.6 2.5* 2.2*  AST  22 21 17   ALT 22 18 13   ALKPHOS 62 62 58  BILITOT 0.9 0.4 <0.2   Hematology Recent Labs  Lab 10/15/23 0519 10/16/23 0408 10/17/23 0316  WBC 11.0* 11.0* 8.4  RBC 3.51* 3.36* 3.25*  HGB 10.3* 9.7* 9.4*  HCT 31.1* 30.5* 29.7*  MCV 88.6 90.8 91.4  MCH 29.3 28.9 28.9  MCHC 33.1 31.8 31.6  RDW 12.6 13.0  13.2  PLT 191 182 215   Cardiac EnzymesNo results for input(s): "TROPONINI" in the last 168 hours. No results for input(s): "TROPIPOC" in the last 168 hours.  BNPNo results for input(s): "BNP", "PROBNP" in the last 168 hours.  DDimer No results for input(s): "DDIMER" in the last 168 hours.  Radiology/Studies:  ECHOCARDIOGRAM COMPLETE Result Date: 10/15/2023    ECHOCARDIOGRAM REPORT   Patient Name:   Pam Soto Date of Exam: 10/15/2023 Medical Rec #:  [id number]      Height:       67.0 in Accession #:    [id number]     Weight:       180.0 lb Date of Birth:  06-Jan-1991       BSA:          1.934 m Patient Age:    32 years       BP:           106/72 mmHg Patient Gender: F              HR:           88 bpm. [facility] Procedure: 2D Echo, Cardiac Doppler and Color Doppler (Both Spectral and Color            Flow Doppler were utilized during procedure). Indications:    Septic embolism [facility]) [[id number]]  History:        Patient has no prior history of Echocardiogram examinations. IV                 drug Use.  Sonographer:    Webb Laws Referring Phys: [id number] TIMOTHY S OPYD IMPRESSIONS  1. Left ventricular ejection fraction, by estimation, is 60 to 65%. The left ventricle has normal function. The left ventricle has no regional wall motion abnormalities. There is mild left ventricular hypertrophy. Left ventricular diastolic parameters were normal.  2. Right ventricular systolic function is normal. The right ventricular size is normal.  3. The mitral valve is normal in structure. Mild mitral valve regurgitation.  4. The aortic valve is normal in structure. Aortic valve regurgitation is  not visualized.  5. The inferior vena cava is dilated in size with <50% respiratory variability, suggesting right atrial pressure of 15 mmHg. FINDINGS  Left Ventricle: Left ventricular ejection fraction, by estimation, is 60 to 65%. The left ventricle has normal function. The left ventricle has no regional wall motion abnormalities. The left ventricular internal cavity size was normal in size. There is  mild left ventricular hypertrophy. Left ventricular diastolic parameters were normal. Right Ventricle: The right ventricular size is normal. Right vetricular wall thickness was not assessed. Right ventricular systolic function is normal. Left Atrium: Left atrial size was normal in size. Right Atrium: Right atrial size was normal in size. Pericardium: There is no evidence of pericardial effusion. Mitral Valve: The mitral valve is normal in structure. Mild mitral valve regurgitation. Tricuspid Valve: The tricuspid valve is normal in structure. Tricuspid valve regurgitation is trivial. Aortic Valve: The aortic valve is normal in structure. Aortic valve regurgitation is not visualized. Pulmonic Valve: The pulmonic valve was normal in structure. Pulmonic valve regurgitation is not visualized. Aorta: The aortic root and ascending aorta are structurally normal, with no evidence of dilitation. Venous: The inferior vena cava is dilated in size with less than 50% respiratory variability, suggesting right atrial pressure of 15 mmHg. IAS/Shunts: No atrial level shunt detected by color flow Doppler.  LEFT VENTRICLE PLAX 2D LVIDd:  4.80 cm   Diastology LVIDs:         3.40 cm   LV e' medial:    11.70 cm/s LV PW:         1.20 cm   LV E/e' medial:  7.6 LV IVS:        1.00 cm   LV e' lateral:   17.00 cm/s LVOT diam:     2.10 cm   LV E/e' lateral: 5.2 LV SV:         65 LV SV Index:   34 LVOT Area:     3.46 cm  RIGHT VENTRICLE             IVC RV Basal diam:  4.10 cm     IVC diam: 3.20 cm RV S prime:     14.20 cm/s TAPSE  (M-mode): 2.3 cm LEFT ATRIUM             Index        RIGHT ATRIUM           Index LA diam:        3.20 cm 1.65 cm/m   RA Area:     13.50 cm LA Vol (A2C):   31.2 ml 16.13 ml/m  RA Volume:   34.60 ml  17.89 ml/m LA Vol (A4C):   39.6 ml 20.48 ml/m LA Biplane Vol: 37.2 ml 19.24 ml/m  AORTIC VALVE LVOT Vmax:   120.00 cm/s LVOT Vmean:  77.600 cm/s LVOT VTI:    0.188 m  AORTA Ao Root diam: 2.50 cm Ao Asc diam:  2.60 cm MITRAL VALVE MV Area (PHT): 3.77 cm    SHUNTS MV Decel Time: 201 msec    Systemic VTI:  0.19 m MV E velocity: 89.20 cm/s  Systemic Diam: 2.10 cm MV A velocity: 60.20 cm/s MV E/A ratio:  1.48 Dietrich Pates MD Electronically signed by Dietrich Pates MD Signature Date/Time: 10/15/2023/3:03:21 PM    Final    CT Angio Chest PE W and/or Wo Contrast Addendum Date: 10/15/2023 ADDENDUM REPORT: 10/15/2023 01:21 ADDENDUM: Upon further evaluation and receipt of additional patient history, the previously noted multifocal infiltrates seen within the right lobe likely represents sequelae associated with septic emboli. Electronically Signed   By: Aram Candela M.D.   On: 10/15/2023 01:21   Result Date: 10/15/2023 CLINICAL DATA:  Right-sided chest pain with shortness of breath and leg cramping. EXAM: CT ANGIOGRAPHY CHEST WITH CONTRAST TECHNIQUE: Multidetector CT imaging of the chest was performed using the standard protocol during bolus administration of intravenous contrast. Multiplanar CT image reconstructions and MIPs were obtained to evaluate the vascular anatomy. RADIATION DOSE REDUCTION: This exam was performed according to the departmental dose-optimization program which includes automated exposure control, adjustment of the mA and/or kV according to patient size and/or use of iterative reconstruction technique. CONTRAST:  75mL OMNIPAQUE IOHEXOL 350 MG/ML SOLN COMPARISON:  None Available. FINDINGS: Cardiovascular: The thoracic aorta is normal in appearance. Satisfactory opacification of the pulmonary arteries to  the segmental level. No evidence of pulmonary embolism. Normal heart size. No pericardial effusion. Mediastinum/Nodes: Mild right hilar lymphadenopathy is suspected. Thyroid gland, trachea, and esophagus demonstrate no significant findings. Lungs/Pleura: Mild multifocal infiltrates are seen with within the right lower lobe 0. No pleural effusion or pneumothorax is identified. Upper Abdomen: No acute abnormality. Musculoskeletal: No chest wall abnormality. No acute or significant osseous findings. Review of the MIP images confirms the above findings. IMPRESSION: 1. No evidence of pulmonary embolism. 2. Mild multifocal right  lower lobe infiltrates. Electronically Signed: By: Aram Candela M.D. On: 10/15/2023 00:56   DG Chest 2 View Result Date: 10/14/2023 CLINICAL DATA:  Chest pain and shortness of breath EXAM: CHEST - 2 VIEW COMPARISON:  01/18/2023 FINDINGS: Cardiac shadow is within normal limits. Lungs are well aerated bilaterally. Mild right lower lobe opacity is noted anteriorly consistent with early infiltrate. No effusion is seen. No bony abnormality is noted. IMPRESSION: Mild right lower lobe infiltrate. Electronically Signed   By: Alcide Clever M.D.   On: 10/14/2023 22:58    Assessment and Plan:   MSSA septicemia, to r/o infective endocarditis  Risks and benefits of the procedure TEE are explained to the patient. Patient comprehended these risks and agreed to proceed with the procedure.   For questions or updates, please contact [facility] Please consult [email or web address] for contact info under Cardiology/STEMI.   Signed, Darryle Dennie Verne Spurr, MD

## 2023-10-17 NOTE — Progress Notes (Addendum)
 ID brief note ( remote fu, not seen in person, unable to get video cart)  T max 101. 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   Patient is not a PICC candidate for home OPAT and will need supervised setting for IV abtx  Plan  -Fu repeat blood cultures to make sure they are negative -Continue cefazolin, monitor CBC and Bmp on abtx -Plan for at least 4 weeks of IV cefazolin from 3/5 (assuming repeat blood cx stay negative) EOT 4/1 followed by one dose of Oritavancin/dalbavancin on 4/2 to complete 6 weeks course  - Fu HCV ab , pending  ID will so, please recall back with questions or concerns or any changes   D/w primary team  Odette Fraction, MD Infectious Disease Physician [facility] [address] [place], [place] [address] Phone: [phone]  Fax: [phone]

## 2023-10-17 NOTE — CV Procedure (Signed)
   TRANSESOPHAGEAL ECHOCARDIOGRAM  NAME:  Pam Soto    MRN: [id number] DOB:  1991-05-05    ADMIT DATE: 10/14/2023  INDICATIONS: MSSA septicemia, to r/o infective endocarditis  PROCEDURE:  Informed consent was obtained prior to the procedure. After a procedural time-out, the oropharynx was anesthetized and the patient was sedated by the anesthesia service. The transesophageal probe was inserted in the esophagus and stomach without difficulty and multiple views were obtained. Anesthesia was monitored by Dr. Johnnette Litter.   FINDINGS: No evidence of vegetations on aortic, mitral, tricuspid and pulmonic valves. Please refer to TEE report for additional details.  Brylin Stanislawski Verne Spurr, MD [place]  [facility]  1:49 PM

## 2023-10-17 NOTE — Progress Notes (Signed)
*  PRELIMINARY RESULTS* Echocardiogram Echocardiogram Transesophageal has been performed.  Stacey Drain 10/17/2023, 2:10 PM

## 2023-10-17 NOTE — Anesthesia Preprocedure Evaluation (Signed)
 Anesthesia Evaluation  Patient identified by MRN, date of birth, ID band Patient awake    Reviewed: Allergy & Precautions, H&P , NPO status , Patient's Chart, lab work & pertinent test results, reviewed documented beta blocker date and time   Airway Mallampati: II  TM Distance: >3 FB Neck ROM: full    Dental no notable dental hx.    Pulmonary pneumonia, Current Smoker   Pulmonary exam normal breath sounds clear to auscultation       Cardiovascular Exercise Tolerance: Good hypertension, negative cardio ROS  Rhythm:regular Rate:Normal     Neuro/Psych  PSYCHIATRIC DISORDERS Anxiety Depression    negative neurological ROS     GI/Hepatic negative GI ROS, Neg liver ROS,,,  Endo/Other  negative endocrine ROS    Renal/GU negative Renal ROS  negative genitourinary   Musculoskeletal   Abdominal   Peds  Hematology  (+) Blood dyscrasia, anemia   Anesthesia Other Findings   Reproductive/Obstetrics negative OB ROS                             Anesthesia Physical Anesthesia Plan  ASA: 4 and emergent  Anesthesia Plan: General   Post-op Pain Management:    Induction:   PONV Risk Score and Plan: Propofol infusion  Airway Management Planned:   Additional Equipment:   Intra-op Plan:   Post-operative Plan:   Informed Consent: I have reviewed the patients History and Physical, chart, labs and discussed the procedure including the risks, benefits and alternatives for the proposed anesthesia with the patient or authorized representative who has indicated his/her understanding and acceptance.     Dental Advisory Given  Plan Discussed with: CRNA  Anesthesia Plan Comments:        Anesthesia Quick Evaluation

## 2023-10-17 NOTE — Transfer of Care (Signed)
 Immediate Anesthesia Transfer of Care Note  Patient: Pam Soto  Procedure(s) Performed: ECHOCARDIOGRAM, TRANSESOPHAGEAL  Patient Location: PACU  Anesthesia Type:General  Level of Consciousness: drowsy and patient cooperative  Airway & Oxygen Therapy: Patient Spontanous Breathing and Patient connected to nasal cannula oxygen  Post-op Assessment: Report given to RN, Post -op Vital signs reviewed and stable, and Patient moving all extremities X 4  Post vital signs: Reviewed and stable  Last Vitals:  Vitals Value Taken Time  BP 103/59 1444  Temp    Pulse 75 1444  Resp 26 1444  SpO2 95 1444    Last Pain:  Vitals:   10/17/23 1151  TempSrc: Oral  PainSc: 8       Patients Stated Pain Goal: 3 (10/17/23 1151)  Complications: No notable events documented.

## 2023-10-17 NOTE — Progress Notes (Addendum)
 PROGRESS NOTE    Pam Soto  MVH:[id number] DOB: [birth year] DOA: 10/14/2023 PCP: Mechele Claude, MD  No chief complaint on file.   Hospital Course:  Pam Soto is 33 y.o. female with history of IV drug abuse who presents with right-sided chest pain and shortness of breath.  Patient endorses muscle cramps over the past few days which she believes are secondary to depleted electrolytes.  She reports her pleuritic chest pain has become severe and thus presented to the hospital.  She reports she has been trying to abstain from illicit substances and has been taking methadone.  She was concerned that her musculoskeletal pain was a result of withdrawal and she injected fentanyl yesterday.  On arrival to the ED patient was febrile, saturating the mid 90s on room air, tachypneic, tachycardic.  Labs reveal hyponatremia to 129, hypokalemia to 2.9, hypomagnesemia 1.6, leukocytosis 12.4 K.  Respiratory viral panel was negative.  Chest x-ray concerning for multifocal pneumonia.  CTA concerning for multifocal infiltrates which may be septic emboli.  Blood cultures were collected, the patient was started on IV fluids.  Subjective: Fever overnight. Going for TEE today. She reports significant improvement in her pain with Toradol addition.  She'd like to continue this medication.  Objective: Vitals:   10/16/23 1405 10/16/23 2225 10/17/23 0509 10/17/23 0608  BP: 114/77 127/63 (!) 124/98   Pulse: 82 89 (!) 103   Resp:  20 (!) 25 18  Temp: 98.5 F (36.9 C) 99 F (37.2 C) (!) 101.1 F (38.4 C) 98.8 F (37.1 C)  TempSrc:  Oral Oral   SpO2: 96% 98% 96%   Weight:      Height:        Intake/Output Summary (Last 24 hours) at 10/17/2023 0853 Last data filed at 10/17/2023 0603 Gross per 24 hour  Intake 552.42 ml  Output --  Net 552.42 ml   Filed Weights   10/14/23 2300  Weight: 81.6 kg    Examination: General exam: Appears calm and uncomfortable.  Respiratory system: No work of breathing,  symmetric chest wall expansion Cardiovascular system: S1 & S2 heard, RRR.  Gastrointestinal system: Abdomen is nondistended, soft and nontender.  Neuro: Alert and oriented. No focal neurological deficits. Extremities: Symmetric, expected ROM Skin: No rashes, lesions Psychiatry: Demonstrates appropriate judgement and insight. Mood & affect appropriate for situation.   Assessment & Plan:  Principal Problem:   Pneumonia Active Problems:   Hyponatremia   Hypokalemia   Hypomagnesemia   Prolonged QT interval   Substance abuse (HCC)   Multifocal pneumonia    Sepsis -Criteria met on arrival leukocytosis, tachycardia, tachypnea.  Source: Pneumonia and bacteremia as above - On IV fluids -- Abx as below  Multifocal pneumonia MSSA Bacteremia - Multifocal infiltrates noted in right lower lobe on CTA.  Concerning for septic emboli - Cultures now positive for MSSA - Sputum culture ordered -- Encouraged IS and FV - HIV negative - Have consulted infectious disease - Echocardiogram: Without evidence of endocarditis.   -Cardiology to take for TEE today - Abx have been deescalated to cefazolin, duration to be decided per TEE results  IV drug abuse - Patient reports she has been working towards abstinence and is on methadone.  Did use IV fentanyl day prior to arrival -- Resume home dose methadone  - TOC consulted for resource assistance  Right side Pleuritis -- Cont methadone, Toradol, lidocaine patches. PRN Oxy. Try to avoid additional narcotics.  Hypokalemia Hypomagnesemia - Replace as needed - Follow BMP  Prolonged QT - Continue to optimize electrolytes, avoid QT prolonging meds - Repeat EKG 3/5 shows Qtc improved to normal  Hyponatremia - Continue IV hydration with NS  AKI, POA - now resolved.  DVT prophylaxis: Lovenox   Code Status: Full Code Family Communication:  Discussed directly with patient Disposition:  Inpatient, still hospitalized for IV abx, TEE today, may  need prolonged course of IV Abx, not a PICC candidate. Will discharge to home when medically stable.  Consultants:  Treatment Team:  Consulting Physician: Odette Fraction, MD Consulting Physician: Marjo Bicker, MD  Procedures:    Antimicrobials:  Anti-infectives (From admission, onward)    Start     Dose/Rate Route Frequency Ordered Stop   10/16/23 0915  ceFAZolin (ANCEF) IVPB 2g/100 mL premix        2 g 200 mL/hr over 30 Minutes Intravenous Every 8 hours 10/16/23 0826     10/15/23 1000  vancomycin (VANCOCIN) IVPB 1000 mg/200 mL premix  Status:  Discontinued        1,000 mg 200 mL/hr over 60 Minutes Intravenous Every 12 hours 10/15/23 0406 10/16/23 0826   10/15/23 0600  cefTRIAXone (ROCEPHIN) 2 g in sodium chloride 0.9 % 100 mL IVPB  Status:  Discontinued        2 g 200 mL/hr over 30 Minutes Intravenous Every 24 hours 10/15/23 0354 10/16/23 0826   10/15/23 0400  doxycycline (VIBRAMYCIN) 100 mg in sodium chloride 0.9 % 250 mL IVPB  Status:  Discontinued        100 mg 125 mL/hr over 120 Minutes Intravenous Every 12 hours 10/15/23 0354 10/16/23 0826   10/14/23 2315  ceFEPIme (MAXIPIME) 2 g in sodium chloride 0.9 % 100 mL IVPB        2 g 200 mL/hr over 30 Minutes Intravenous  Once 10/14/23 2303 10/15/23 0109   10/14/23 2315  Vancomycin (VANCOCIN) 1,500 mg in sodium chloride 0.9 % 500 mL IVPB        1,500 mg 250 mL/hr over 120 Minutes Intravenous  Once 10/14/23 2304 10/15/23 0228       Data Reviewed: I have personally reviewed following labs and imaging studies CBC: Recent Labs  Lab 10/14/23 2138 10/15/23 0519 10/16/23 0408 10/17/23 0316  WBC 12.4* 11.0* 11.0* 8.4  NEUTROABS  --   --  7.5 5.1  HGB 12.1 10.3* 9.7* 9.4*  HCT 35.8* 31.1* 30.5* 29.7*  MCV 86.7 88.6 90.8 91.4  PLT 222 191 182 215   Basic Metabolic Panel: Recent Labs  Lab 10/14/23 2138 10/15/23 0519 10/15/23 1549 10/16/23 0408 10/17/23 0316  NA 129* 134* 132* 134* 137  K 2.9* 2.5* 4.2 3.7  3.5  CL 97* 102 101 109 107  CO2 20* 24 23 21* 24  GLUCOSE 105* 114* 109* 114* 96  BUN 15 14 12 8 6   CREATININE 1.01* 0.77 0.59 0.60 0.55  CALCIUM 8.2* 8.0* 8.1* 7.9* 7.8*  MG 1.6*  --   --  1.9 1.7  PHOS  --   --   --  2.3* 3.3   GFR: Estimated Creatinine Clearance: 113 mL/min (by C-G formula based on SCr of 0.55 mg/dL). Liver Function Tests: Recent Labs  Lab 10/14/23 2138 10/16/23 0408 10/17/23 0316  AST 22 21 17   ALT 22 18 13   ALKPHOS 62 62 58  BILITOT 0.9 0.4 <0.2  PROT 7.6 5.9* 5.6*  ALBUMIN 3.6 2.5* 2.2*   CBG: No results for input(s): "GLUCAP" in the last 168 hours.  Recent Results (  from the past 240 hours)  Resp panel by RT-PCR (RSV, Flu A&B, Covid) Anterior Nasal Swab     Status: None   Collection Time: 10/14/23  8:56 PM   Specimen: Anterior Nasal Swab  Result Value Ref Range Status   SARS Coronavirus 2 by RT PCR NEGATIVE NEGATIVE Final    Comment: (NOTE) SARS-CoV-2 target nucleic acids are NOT DETECTED.  The SARS-CoV-2 RNA is generally detectable in upper respiratory specimens during the acute phase of infection. The lowest concentration of SARS-CoV-2 viral copies this assay can detect is 138 copies/mL. A negative result does not preclude SARS-Cov-2 infection and should not be used as the sole basis for treatment or other patient management decisions. A negative result may occur with  improper specimen collection/handling, submission of specimen other than nasopharyngeal swab, presence of viral mutation(s) within the areas targeted by this assay, and inadequate number of viral copies(<138 copies/mL). A negative result must be combined with clinical observations, patient history, and epidemiological information. The expected result is Negative.  Fact Sheet for Patients:  BloggerCourse.com  Fact Sheet for Healthcare Providers:  SeriousBroker.it  This test is no t yet approved or cleared by the [place] FDA and  has been authorized for detection and/or diagnosis of SARS-CoV-2 by FDA under an Emergency Use Authorization (EUA). This EUA will remain  in effect (meaning this test can be used) for the duration of the COVID-19 declaration under Section 564(b)(1) of the Act, 21 U.S.C.section 360bbb-3(b)(1), unless the authorization is terminated  or revoked sooner.       Influenza A by PCR NEGATIVE NEGATIVE Final   Influenza B by PCR NEGATIVE NEGATIVE Final    Comment: (NOTE) The Xpert Xpress SARS-CoV-2/FLU/RSV plus assay is intended as an aid in the diagnosis of influenza from Nasopharyngeal swab specimens and should not be used as a sole basis for treatment. Nasal washings and aspirates are unacceptable for Xpert Xpress SARS-CoV-2/FLU/RSV testing.  Fact Sheet for Patients: BloggerCourse.com  Fact Sheet for Healthcare Providers: SeriousBroker.it  This test is not yet approved or cleared by the [place] FDA and has been authorized for detection and/or diagnosis of SARS-CoV-2 by FDA under an Emergency Use Authorization (EUA). This EUA will remain in effect (meaning this test can be used) for the duration of the COVID-19 declaration under Section 564(b)(1) of the Act, 21 U.S.C. section 360bbb-3(b)(1), unless the authorization is terminated or revoked.     Resp Syncytial Virus by PCR NEGATIVE NEGATIVE Final    Comment: (NOTE) Fact Sheet for Patients: BloggerCourse.com  Fact Sheet for Healthcare Providers: SeriousBroker.it  This test is not yet approved or cleared by the [place] FDA and has been authorized for detection and/or diagnosis of SARS-CoV-2 by FDA under an Emergency Use Authorization (EUA). This EUA will remain in effect (meaning this test can be used) for the duration of the COVID-19 declaration under Section 564(b)(1) of the Act, 21 U.S.C. section  360bbb-3(b)(1), unless the authorization is terminated or revoked.  Performed at [facility], [address], [place], [place] [address]   Blood culture (routine x 2)     Status: Abnormal   Collection Time: 10/14/23 11:03 PM   Specimen: BLOOD  Result Value Ref Range Status   Specimen Description   Final    BLOOD BLOOD RIGHT ARM Performed at [facility], [address], [place], [place] [address]    Special Requests   Final    BOTTLES DRAWN AEROBIC AND ANAEROBIC Blood Culture adequate volume Performed at  [facility], [address], [place], [place] [address]    Culture  Setup Time   Final    GRAM POSITIVE COCCI ANAEROBIC BOTTLE ONLY Gram Stain Report Called to,Read Back By and Verified With: Wilburt Finlay, RN AT 1123 10/15/23 BY A. SNYDER AEROBIC BOTTLE ONLY GRAM POSITIVE COCCI GRAM STAIN REVIEWED-AGREE WITH RESULT CRITICAL RESULT CALLED TO, READ BACK BY AND VERIFIED WITH: RN RENEE TEJEDA ON 10/15/23 @ 1733 BY DRT  Performed at [facility], [address], [place], [place] [address]    Culture STAPHYLOCOCCUS AUREUS (A)  Final   Report Status 10/17/2023 FINAL  Final   Organism ID, Bacteria STAPHYLOCOCCUS AUREUS  Final      Susceptibility   Staphylococcus aureus - MIC*    CIPROFLOXACIN >=8 RESISTANT Resistant     ERYTHROMYCIN >=8 RESISTANT Resistant     GENTAMICIN <=0.5 SENSITIVE Sensitive     OXACILLIN 0.5 SENSITIVE Sensitive     TETRACYCLINE <=1 SENSITIVE Sensitive     VANCOMYCIN <=0.5 SENSITIVE Sensitive     TRIMETH/SULFA >=320 RESISTANT Resistant     CLINDAMYCIN <=0.25 SENSITIVE Sensitive     RIFAMPIN <=0.5 SENSITIVE Sensitive     Inducible Clindamycin NEGATIVE Sensitive     LINEZOLID 2 SENSITIVE Sensitive     * STAPHYLOCOCCUS AUREUS  Blood Culture ID Panel (Reflexed)     Status: Abnormal   Collection Time: 10/14/23 11:03 PM  Result Value Ref Range Status   Enterococcus faecalis NOT DETECTED NOT DETECTED Final   Enterococcus Faecium NOT DETECTED NOT DETECTED Final    Listeria monocytogenes NOT DETECTED NOT DETECTED Final   Staphylococcus species DETECTED (A) NOT DETECTED Final    Comment: CRITICAL RESULT CALLED TO, READ BACK BY AND VERIFIED WITH: RN RENEE TEJEDA ON 10/15/23 @ 1733 BY DRT     Staphylococcus aureus (BCID) DETECTED (A) NOT DETECTED Final    Comment: CRITICAL RESULT CALLED TO, READ BACK BY AND VERIFIED WITH: RN RENEE TEJEDA ON 10/15/23 @ 1733 BY DRT     Staphylococcus epidermidis NOT DETECTED NOT DETECTED Final   Staphylococcus lugdunensis NOT DETECTED NOT DETECTED Final   Streptococcus species NOT DETECTED NOT DETECTED Final   Streptococcus agalactiae NOT DETECTED NOT DETECTED Final   Streptococcus pneumoniae NOT DETECTED NOT DETECTED Final   Streptococcus pyogenes NOT DETECTED NOT DETECTED Final   A.calcoaceticus-baumannii NOT DETECTED NOT DETECTED Final   Bacteroides fragilis NOT DETECTED NOT DETECTED Final   Enterobacterales NOT DETECTED NOT DETECTED Final   Enterobacter cloacae complex NOT DETECTED NOT DETECTED Final   Escherichia coli NOT DETECTED NOT DETECTED Final   Klebsiella aerogenes NOT DETECTED NOT DETECTED Final   Klebsiella oxytoca NOT DETECTED NOT DETECTED Final   Klebsiella pneumoniae NOT DETECTED NOT DETECTED Final   Proteus species NOT DETECTED NOT DETECTED Final   Salmonella species NOT DETECTED NOT DETECTED Final   Serratia marcescens NOT DETECTED NOT DETECTED Final   Haemophilus influenzae NOT DETECTED NOT DETECTED Final   Neisseria meningitidis NOT DETECTED NOT DETECTED Final   Pseudomonas aeruginosa NOT DETECTED NOT DETECTED Final   Stenotrophomonas maltophilia NOT DETECTED NOT DETECTED Final   Candida albicans NOT DETECTED NOT DETECTED Final   Candida auris NOT DETECTED NOT DETECTED Final   Candida glabrata NOT DETECTED NOT DETECTED Final   Candida krusei NOT DETECTED NOT DETECTED Final   Candida parapsilosis NOT DETECTED NOT DETECTED Final   Candida tropicalis NOT DETECTED NOT DETECTED Final   Cryptococcus  neoformans/gattii NOT DETECTED NOT DETECTED Final   Meth  resistant mecA/C and MREJ NOT DETECTED NOT DETECTED Final    Comment: Performed at [facility], [address], [place], [place] [address]  Blood culture (routine x 2)     Status: Abnormal   Collection Time: 10/14/23 11:06 PM   Specimen: BLOOD  Result Value Ref Range Status   Specimen Description   Final    BLOOD BLOOD RIGHT ARM Performed at [facility], [address] [place], [place] [address]    Special Requests   Final    BOTTLES DRAWN AEROBIC AND ANAEROBIC Blood Culture adequate volume Performed at [facility], [address], [place], [place] [address]    Culture  Setup Time   Final    GRAM POSITIVE COCCI IN BOTH AEROBIC AND ANAEROBIC BOTTLES Gram Stain Report Called to,Read Back By and Verified With: Wilburt Finlay, RN AT 1123 10/15/23 BY A. SNYDER    Culture (A)  Final    STAPHYLOCOCCUS AUREUS SUSCEPTIBILITIES PERFORMED ON PREVIOUS CULTURE WITHIN THE LAST 5 DAYS. Performed at [facility], [address], [place], [place] [address]    Report Status 10/17/2023 FINAL  Final  Culture, blood (single) w Reflex to ID Panel     Status: None (Preliminary result)   Collection Time: 10/15/23  5:41 AM   Specimen: BLOOD  Result Value Ref Range Status   Specimen Description BLOOD BLOOD RIGHT HAND  Final   Special Requests   Final    BOTTLES DRAWN AEROBIC ONLY Blood Culture adequate volume   Culture   Final    NO GROWTH 2 DAYS Performed at [facility], [address] [place], [place] [address]    Report Status PENDING  Incomplete  Culture, blood (Routine X 2) w Reflex to ID Panel     Status: None (Preliminary result)   Collection Time: 10/16/23  4:08 AM   Specimen: BLOOD  Result Value Ref Range Status   Specimen Description BLOOD BLOOD LEFT HAND  Final   Special Requests   Final    BOTTLES DRAWN AEROBIC AND ANAEROBIC Blood Culture adequate volume   Culture   Final    NO GROWTH 1 DAY Performed at [facility], [address] [place], [place] [address]    Report Status PENDING  Incomplete  Culture, blood (Routine X 2) w Reflex to ID Panel     Status: None (Preliminary result)   Collection Time: 10/16/23  4:08 AM   Specimen: BLOOD  Result Value Ref Range Status   Specimen Description BLOOD BLOOD LEFT WRIST  Final   Special Requests   Final    BOTTLES DRAWN AEROBIC AND ANAEROBIC Blood Culture adequate volume   Culture   Final    NO GROWTH 1 DAY Performed at [facility] [address], [place], [place] [address]    Report Status PENDING  Incomplete     Radiology Studies: ECHOCARDIOGRAM COMPLETE Result Date: 10/15/2023    ECHOCARDIOGRAM REPORT   Patient Name:   LASUNDRA HASCALL Date of Exam: 10/15/2023 Medical Rec #:  [id number]      Height:       67.0 in Accession #:    [id number]     Weight:       180.0 lb Date of Birth:  29-Nov-1990       BSA:          1.934 m Patient Age:    32 years       BP:  106/72 mmHg Patient Gender: F              HR:           88 bpm. [facility] Procedure: 2D Echo, Cardiac Doppler and Color Doppler (Both Spectral and Color            Flow Doppler were utilized during procedure). Indications:    Septic embolism [facility]) [[id number]]  History:        Patient has no prior history of Echocardiogram examinations. IV                 drug Use.  Sonographer:    Webb Laws Referring Phys: [id number] TIMOTHY S OPYD IMPRESSIONS  1. Left ventricular ejection fraction, by estimation, is 60 to 65%. The left ventricle has normal function. The left ventricle has no regional wall motion abnormalities. There is mild left ventricular hypertrophy. Left ventricular diastolic parameters were normal.  2. Right ventricular systolic function is normal. The right ventricular size is normal.  3. The mitral valve is normal in structure. Mild mitral valve regurgitation.  4. The aortic valve is normal in structure. Aortic valve regurgitation is not visualized.  5. The inferior vena cava is  dilated in size with <50% respiratory variability, suggesting right atrial pressure of 15 mmHg. FINDINGS  Left Ventricle: Left ventricular ejection fraction, by estimation, is 60 to 65%. The left ventricle has normal function. The left ventricle has no regional wall motion abnormalities. The left ventricular internal cavity size was normal in size. There is  mild left ventricular hypertrophy. Left ventricular diastolic parameters were normal. Right Ventricle: The right ventricular size is normal. Right vetricular wall thickness was not assessed. Right ventricular systolic function is normal. Left Atrium: Left atrial size was normal in size. Right Atrium: Right atrial size was normal in size. Pericardium: There is no evidence of pericardial effusion. Mitral Valve: The mitral valve is normal in structure. Mild mitral valve regurgitation. Tricuspid Valve: The tricuspid valve is normal in structure. Tricuspid valve regurgitation is trivial. Aortic Valve: The aortic valve is normal in structure. Aortic valve regurgitation is not visualized. Pulmonic Valve: The pulmonic valve was normal in structure. Pulmonic valve regurgitation is not visualized. Aorta: The aortic root and ascending aorta are structurally normal, with no evidence of dilitation. Venous: The inferior vena cava is dilated in size with less than 50% respiratory variability, suggesting right atrial pressure of 15 mmHg. IAS/Shunts: No atrial level shunt detected by color flow Doppler.  LEFT VENTRICLE PLAX 2D LVIDd:         4.80 cm   Diastology LVIDs:         3.40 cm   LV e' medial:    11.70 cm/s LV PW:         1.20 cm   LV E/e' medial:  7.6 LV IVS:        1.00 cm   LV e' lateral:   17.00 cm/s LVOT diam:     2.10 cm   LV E/e' lateral: 5.2 LV SV:         65 LV SV Index:   34 LVOT Area:     3.46 cm  RIGHT VENTRICLE             IVC RV Basal diam:  4.10 cm     IVC diam: 3.20 cm RV S prime:     14.20 cm/s TAPSE (M-mode): 2.3 cm LEFT ATRIUM  Index         RIGHT ATRIUM           Index LA diam:        3.20 cm 1.65 cm/m   RA Area:     13.50 cm LA Vol (A2C):   31.2 ml 16.13 ml/m  RA Volume:   34.60 ml  17.89 ml/m LA Vol (A4C):   39.6 ml 20.48 ml/m LA Biplane Vol: 37.2 ml 19.24 ml/m  AORTIC VALVE LVOT Vmax:   120.00 cm/s LVOT Vmean:  77.600 cm/s LVOT VTI:    0.188 m  AORTA Ao Root diam: 2.50 cm Ao Asc diam:  2.60 cm MITRAL VALVE MV Area (PHT): 3.77 cm    SHUNTS MV Decel Time: 201 msec    Systemic VTI:  0.19 m MV E velocity: 89.20 cm/s  Systemic Diam: 2.10 cm MV A velocity: 60.20 cm/s MV E/A ratio:  1.48 Dietrich Pates MD Electronically signed by Dietrich Pates MD Signature Date/Time: 10/15/2023/3:03:21 PM    Final     Scheduled Meds:  enoxaparin (LOVENOX) injection  40 mg Subcutaneous Q24H   gabapentin  200 mg Oral TID   ketorolac  30 mg Intravenous Q8H   lidocaine  1 patch Transdermal Q24H   methadone  40 mg Oral Daily   nicotine  21 mg Transdermal Daily   pantoprazole  40 mg Oral Daily   sodium chloride flush  3 mL Intravenous Q12H   Continuous Infusions:   ceFAZolin (ANCEF) IV 2 g (10/17/23 0559)     LOS: 2 days    Total time spent interpreting labs and vitals, coordinating care amongst consultants and care team members, directly assessing and discussing care with the patient and/or family: 55 min   Debarah Crape, DO [facility]  To contact the attending physician between 7A-7P please use Epic Chat. To contact the covering physician during after hours 7P-7A, please review Amion.   10/17/2023, 8:53 AM   *This document has been created with the assistance of dictation software. Please excuse typographical errors. *

## 2023-10-18 DIAGNOSIS — F1910 Other psychoactive substance abuse, uncomplicated: Secondary | ICD-10-CM | POA: Diagnosis not present

## 2023-10-18 DIAGNOSIS — R9431 Abnormal electrocardiogram [ECG] [EKG]: Secondary | ICD-10-CM | POA: Diagnosis not present

## 2023-10-18 DIAGNOSIS — E876 Hypokalemia: Secondary | ICD-10-CM | POA: Diagnosis not present

## 2023-10-18 LAB — COMPREHENSIVE METABOLIC PANEL WITH GFR
ALT: 15 U/L (ref 0–44)
AST: 20 U/L (ref 15–41)
Albumin: 2.6 g/dL — ABNORMAL LOW (ref 3.5–5.0)
Alkaline Phosphatase: 83 U/L (ref 38–126)
Anion gap: 7 (ref 5–15)
BUN: 8 mg/dL (ref 6–20)
CO2: 26 mmol/L (ref 22–32)
Calcium: 8.3 mg/dL — ABNORMAL LOW (ref 8.9–10.3)
Chloride: 106 mmol/L (ref 98–111)
Creatinine, Ser: 0.66 mg/dL (ref 0.44–1.00)
GFR, Estimated: >60 mL/min
Glucose, Bld: 78 mg/dL (ref 70–99)
Potassium: 3.5 mmol/L (ref 3.5–5.1)
Sodium: 139 mmol/L (ref 135–145)
Total Bilirubin: 0.2 mg/dL (ref 0.0–1.2)
Total Protein: 6.9 g/dL (ref 6.5–8.1)

## 2023-10-18 LAB — CBC WITH DIFFERENTIAL/PLATELET
Abs Immature Granulocytes: 0.11 10*3/uL — ABNORMAL HIGH (ref 0.00–0.07)
Basophils Absolute: 0.1 10*3/uL (ref 0.0–0.1)
Basophils Relative: 1 %
Eosinophils Absolute: 0.3 10*3/uL (ref 0.0–0.5)
Eosinophils Relative: 3 %
HCT: 35.5 % — ABNORMAL LOW (ref 36.0–46.0)
Hemoglobin: 10.8 g/dL — ABNORMAL LOW (ref 12.0–15.0)
Immature Granulocytes: 1 %
Lymphocytes Relative: 30 %
Lymphs Abs: 2.6 10*3/uL (ref 0.7–4.0)
MCH: 28.6 pg (ref 26.0–34.0)
MCHC: 30.4 g/dL (ref 30.0–36.0)
MCV: 93.9 fL (ref 80.0–100.0)
Monocytes Absolute: 0.7 10*3/uL (ref 0.1–1.0)
Monocytes Relative: 8 %
Neutro Abs: 5 10*3/uL (ref 1.7–7.7)
Neutrophils Relative %: 57 %
Platelets: 310 10*3/uL (ref 150–400)
RBC: 3.78 MIL/uL — ABNORMAL LOW (ref 3.87–5.11)
RDW: 13.1 % (ref 11.5–15.5)
WBC: 8.7 10*3/uL (ref 4.0–10.5)
nRBC: 0 % (ref 0.0–0.2)

## 2023-10-18 LAB — MAGNESIUM: Magnesium: 2 mg/dL (ref 1.7–2.4)

## 2023-10-18 LAB — HCV AB W REFLEX TO QUANT PCR: HCV Ab: REACTIVE — AB

## 2023-10-18 LAB — PHOSPHORUS: Phosphorus: 4.5 mg/dL (ref 2.5–4.6)

## 2023-10-18 LAB — HCV RT-PCR, QUANT (NON-GRAPH)
HCV log10: 5.299 {Log_IU}/mL
Hepatitis C Quantitation: 199000 [IU]/mL

## 2023-10-18 MED ORDER — GABAPENTIN 300 MG PO CAPS
300.0000 mg | ORAL_CAPSULE | Freq: Three times a day (TID) | ORAL | Status: DC
  Administered 2023-10-18 – 2023-11-11 (×72): 300 mg via ORAL
  Filled 2023-10-18 (×72): qty 1

## 2023-10-18 MED ORDER — KETOROLAC TROMETHAMINE 30 MG/ML IJ SOLN
15.0000 mg | Freq: Four times a day (QID) | INTRAMUSCULAR | Status: DC
  Administered 2023-10-18 – 2023-10-19 (×4): 15 mg via INTRAVENOUS
  Filled 2023-10-18 (×4): qty 1

## 2023-10-18 NOTE — Progress Notes (Signed)
 PROGRESS NOTE    Pam Soto  ZHY:[id number] DOB: 1990/08/28 DOA: 10/14/2023 PCP: Mechele Claude, MD  No chief complaint on file.   Hospital Course:  Pam Soto is 33 y.o. female with history of IV drug abuse who presents with right-sided chest pain and shortness of breath.  Patient endorses muscle cramps over the past few days which she believes are secondary to depleted electrolytes.  She reports her pleuritic chest pain has become severe and thus presented to the hospital.  She reports she has been trying to abstain from illicit substances and has been taking methadone.  She was concerned that her musculoskeletal pain was a result of withdrawal and she injected fentanyl yesterday.  On arrival to the ED patient was febrile, saturating the mid 90s on room air, tachypneic, tachycardic.  Labs reveal hyponatremia to 129, hypokalemia to 2.9, hypomagnesemia 1.6, leukocytosis 12.4 K.  Respiratory viral panel was negative.  Chest x-ray concerning for multifocal pneumonia.  CTA concerning for multifocal infiltrates which may be septic emboli.  Blood cultures were collected, the patient was started on IV fluids.  Subjective: No acute events overnight.  No fever overnight.  She is requesting increased frequency of Toradol.  Objective: Vitals:   10/17/23 1345 10/17/23 1429 10/17/23 2140 10/18/23 0420  BP:  112/80 127/69 125/84  Pulse: (P) 76  93 80  Resp: (P) 18  20 20   Temp: (P) 98.7 F (37.1 C) 98.6 F (37 C) 99.1 F (37.3 C) 98 F (36.7 C)  TempSrc:  Oral Oral Oral  SpO2: 98% 96% 97% 97%  Weight:      Height:        Intake/Output Summary (Last 24 hours) at 10/18/2023 0905 Last data filed at 10/18/2023 0514 Gross per 24 hour  Intake 1083 ml  Output --  Net 1083 ml   Filed Weights   10/14/23 2300 10/17/23 1151  Weight: 81.6 kg 81.6 kg    Examination: General exam: Appears calm and uncomfortable.  Respiratory system: No work of breathing, symmetric chest wall  expansion Cardiovascular system: S1 & S2 heard, RRR.  Gastrointestinal system: Abdomen is nondistended, soft and nontender.  Neuro: Alert and oriented. No focal neurological deficits. Extremities: Symmetric, expected ROM Skin: No rashes, lesions Psychiatry: Demonstrates appropriate judgement and insight. Mood & affect appropriate for situation.   Assessment & Plan:  Principal Problem:   Pneumonia Active Problems:   Hyponatremia   Hypokalemia   Hypomagnesemia   Prolonged QT interval   Substance abuse (HCC)   Multifocal pneumonia   MSSA bacteremia    Sepsis -Criteria met on arrival leukocytosis, tachycardia, tachypnea.  Source: Pneumonia and bacteremia as above - On IV fluids -- Abx as below  Multifocal pneumonia MSSA Bacteremia - Multifocal infiltrates noted in right lower lobe on CTA.  Concerning for septic emboli - 3/4 cultures positive for MSSA bacteremia. Repeat 3/6 cultures still negative, follow. - Sputum culture ordered, appears not collected - TTE without evidence of endocarditis, TEE also negative - Infectious disease consulted.  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 - Will need to continue on cefazolin for 4 weeks from 3/5 with end of treatment on 4/1 followed by dose of oritavancin/dalbavancin on 4/2 to complete the 6-week course. - Unfortunately given patient's active IV drug use she is not a candidate for PICC therapy and will need to remain in the supervised setting for the duration of her  treatment. She is amendable to this.  -- Encouraged IS and FV - HIV negative, HepC pending -- HepB non immune, consider vaccination when out of acute illness   IV drug abuse - Patient reports she has been working towards abstinence and is on methadone.  Did use IV fentanyl day prior to arrival -- Resume home dose methadone  - TOC consulted for resource  assistance  Right side Pleuritis -- Cont methadone, Toradol, gabapentin, lidocaine patches. PRN Oxy. Try to avoid additional narcotics. Wean Oxy as able  Hypokalemia Hypomagnesemia - Replace as needed - Follow BMP  Prolonged QT - Continue to optimize electrolytes, avoid QT prolonging meds - Repeat EKG 3/5 shows Qtc improved to normal  Hyponatremia - Continue IV hydration with NS  AKI, POA - now resolved.  DVT prophylaxis: Lovenox   Code Status: Full Code Family Communication:  Discussed directly with patient and her sister, Pam Soto, on the phone. Disposition:  Inpatient, still hospitalized for IV abx, will need until 4/1, not a candidate for PICC therapy. Will need to remain in house until 4/1.  Consultants:  Treatment Team:  Consulting Physician: Odette Fraction, MD Consulting Physician: Marjo Bicker, MD  Procedures:    Antimicrobials:  Anti-infectives (From admission, onward)    Start     Dose/Rate Route Frequency Ordered Stop   10/16/23 0915  ceFAZolin (ANCEF) IVPB 2g/100 mL premix        2 g 200 mL/hr over 30 Minutes Intravenous Every 8 hours 10/16/23 0826     10/15/23 1000  vancomycin (VANCOCIN) IVPB 1000 mg/200 mL premix  Status:  Discontinued        1,000 mg 200 mL/hr over 60 Minutes Intravenous Every 12 hours 10/15/23 0406 10/16/23 0826   10/15/23 0600  cefTRIAXone (ROCEPHIN) 2 g in sodium chloride 0.9 % 100 mL IVPB  Status:  Discontinued        2 g 200 mL/hr over 30 Minutes Intravenous Every 24 hours 10/15/23 0354 10/16/23 0826   10/15/23 0400  doxycycline (VIBRAMYCIN) 100 mg in sodium chloride 0.9 % 250 mL IVPB  Status:  Discontinued        100 mg 125 mL/hr over 120 Minutes Intravenous Every 12 hours 10/15/23 0354 10/16/23 0826   10/14/23 2315  ceFEPIme (MAXIPIME) 2 g in sodium chloride 0.9 % 100 mL IVPB        2 g 200 mL/hr over 30 Minutes Intravenous  Once 10/14/23 2303 10/15/23 0109   10/14/23 2315  Vancomycin (VANCOCIN) 1,500 mg in sodium  chloride 0.9 % 500 mL IVPB        1,500 mg 250 mL/hr over 120 Minutes Intravenous  Once 10/14/23 2304 10/15/23 0228       Data Reviewed: I have personally reviewed following labs and imaging studies CBC: Recent Labs  Lab 10/14/23 2138 10/15/23 0519 10/16/23 0408 10/17/23 0316 10/18/23 0505  WBC 12.4* 11.0* 11.0* 8.4 8.7  NEUTROABS  --   --  7.5 5.1 5.0  HGB 12.1 10.3* 9.7* 9.4* 10.8*  HCT 35.8* 31.1* 30.5* 29.7* 35.5*  MCV 86.7 88.6 90.8 91.4 93.9  PLT 222 191 182 215 310   Basic Metabolic Panel: Recent Labs  Lab 10/14/23 2138 10/15/23 0519 10/15/23 1549 10/16/23 0408 10/17/23 0316 10/18/23 0505  NA 129* 134* 132* 134* 137 139  K 2.9* 2.5* 4.2 3.7 3.5 3.5  CL 97* 102 101 109 107 106  CO2 20* 24 23 21* 24 26  GLUCOSE 105* 114* 109* 114* 96 78  BUN 15 14 12 8 6 8   CREATININE 1.01* 0.77 0.59 0.60 0.55 0.66  CALCIUM 8.2* 8.0* 8.1* 7.9* 7.8* 8.3*  MG 1.6*  --   --  1.9 1.7 2.0  PHOS  --   --   --  2.3* 3.3 4.5   GFR: Estimated Creatinine Clearance: 113 mL/min (by C-G formula based on SCr of 0.66 mg/dL). Liver Function Tests: Recent Labs  Lab 10/14/23 2138 10/16/23 0408 10/17/23 0316 10/18/23 0505  AST 22 21 17 20   ALT 22 18 13 15   ALKPHOS 62 62 58 83  BILITOT 0.9 0.4 <0.2 0.2  PROT 7.6 5.9* 5.6* 6.9  ALBUMIN 3.6 2.5* 2.2* 2.6*   CBG: No results for input(s): "GLUCAP" in the last 168 hours.  Recent Results (from the past 240 hours)  Resp panel by RT-PCR (RSV, Flu A&B, Covid) Anterior Nasal Swab     Status: None   Collection Time: 10/14/23  8:56 PM   Specimen: Anterior Nasal Swab  Result Value Ref Range Status   SARS Coronavirus 2 by RT PCR NEGATIVE NEGATIVE Final    Comment: (NOTE) SARS-CoV-2 target nucleic acids are NOT DETECTED.  The SARS-CoV-2 RNA is generally detectable in upper respiratory specimens during the acute phase of infection. The lowest concentration of SARS-CoV-2 viral copies this assay can detect is 138 copies/mL. A negative result  does not preclude SARS-Cov-2 infection and should not be used as the sole basis for treatment or other patient management decisions. A negative result may occur with  improper specimen collection/handling, submission of specimen other than nasopharyngeal swab, presence of viral mutation(s) within the areas targeted by this assay, and inadequate number of viral copies(<138 copies/mL). A negative result must be combined with clinical observations, patient history, and epidemiological information. The expected result is Negative.  Fact Sheet for Patients:  BloggerCourse.com  Fact Sheet for Healthcare Providers:  SeriousBroker.it  This test is no t yet approved or cleared by the [place] FDA and  has been authorized for detection and/or diagnosis of SARS-CoV-2 by FDA under an Emergency Use Authorization (EUA). This EUA will remain  in effect (meaning this test can be used) for the duration of the COVID-19 declaration under Section 564(b)(1) of the Act, 21 U.S.C.section 360bbb-3(b)(1), unless the authorization is terminated  or revoked sooner.       Influenza A by PCR NEGATIVE NEGATIVE Final   Influenza B by PCR NEGATIVE NEGATIVE Final    Comment: (NOTE) The Xpert Xpress SARS-CoV-2/FLU/RSV plus assay is intended as an aid in the diagnosis of influenza from Nasopharyngeal swab specimens and should not be used as a sole basis for treatment. Nasal washings and aspirates are unacceptable for Xpert Xpress SARS-CoV-2/FLU/RSV testing.  Fact Sheet for Patients: BloggerCourse.com  Fact Sheet for Healthcare Providers: SeriousBroker.it  This test is not yet approved or cleared by the [place] FDA and has been authorized for detection and/or diagnosis of SARS-CoV-2 by FDA under an Emergency Use Authorization (EUA). This EUA will remain in effect (meaning this test can be used) for  the duration of the COVID-19 declaration under Section 564(b)(1) of the Act, 21 U.S.C. section 360bbb-3(b)(1), unless the authorization is terminated or revoked.     Resp Syncytial Virus by PCR NEGATIVE NEGATIVE Final    Comment: (NOTE) Fact Sheet for Patients: BloggerCourse.com  Fact Sheet for Healthcare Providers: SeriousBroker.it  This test is not yet approved or cleared by the [place] FDA and has been authorized for detection and/or diagnosis of SARS-CoV-2  by FDA under an Emergency Use Authorization (EUA). This EUA will remain in effect (meaning this test can be used) for the duration of the COVID-19 declaration under Section 564(b)(1) of the Act, 21 U.S.C. section 360bbb-3(b)(1), unless the authorization is terminated or revoked.  Performed at [facility], [address], [place], [place] [address]   Blood culture (routine x 2)     Status: Abnormal   Collection Time: 10/14/23 11:03 PM   Specimen: BLOOD  Result Value Ref Range Status   Specimen Description   Final    BLOOD BLOOD RIGHT ARM Performed at [facility], [address] [place], [place] [address]    Special Requests   Final    BOTTLES DRAWN AEROBIC AND ANAEROBIC Blood Culture adequate volume Performed at [facility], [address] [place], [place] [address]    Culture  Setup Time   Final    GRAM POSITIVE COCCI ANAEROBIC BOTTLE ONLY Gram Stain Report Called to,Read Back By and Verified With: Wilburt Finlay, RN AT 1123 10/15/23 BY A. SNYDER AEROBIC BOTTLE ONLY GRAM POSITIVE COCCI GRAM STAIN REVIEWED-AGREE WITH RESULT CRITICAL RESULT CALLED TO, READ BACK BY AND VERIFIED WITH: RN RENEE TEJEDA ON 10/15/23 @ 1733 BY DRT  Performed at [facility], [address], [place], [place] [address]    Culture STAPHYLOCOCCUS AUREUS (A)  Final   Report Status 10/17/2023 FINAL  Final   Organism ID, Bacteria STAPHYLOCOCCUS AUREUS  Final      Susceptibility    Staphylococcus aureus - MIC*    CIPROFLOXACIN >=8 RESISTANT Resistant     ERYTHROMYCIN >=8 RESISTANT Resistant     GENTAMICIN <=0.5 SENSITIVE Sensitive     OXACILLIN 0.5 SENSITIVE Sensitive     TETRACYCLINE <=1 SENSITIVE Sensitive     VANCOMYCIN <=0.5 SENSITIVE Sensitive     TRIMETH/SULFA >=320 RESISTANT Resistant     CLINDAMYCIN <=0.25 SENSITIVE Sensitive     RIFAMPIN <=0.5 SENSITIVE Sensitive     Inducible Clindamycin NEGATIVE Sensitive     LINEZOLID 2 SENSITIVE Sensitive     * STAPHYLOCOCCUS AUREUS  Blood Culture ID Panel (Reflexed)     Status: Abnormal   Collection Time: 10/14/23 11:03 PM  Result Value Ref Range Status   Enterococcus faecalis NOT DETECTED NOT DETECTED Final   Enterococcus Faecium NOT DETECTED NOT DETECTED Final   Listeria monocytogenes NOT DETECTED NOT DETECTED Final   Staphylococcus species DETECTED (A) NOT DETECTED Final    Comment: CRITICAL RESULT CALLED TO, READ BACK BY AND VERIFIED WITH: RN RENEE TEJEDA ON 10/15/23 @ 1733 BY DRT     Staphylococcus aureus (BCID) DETECTED (A) NOT DETECTED Final    Comment: CRITICAL RESULT CALLED TO, READ BACK BY AND VERIFIED WITH: RN RENEE TEJEDA ON 10/15/23 @ 1733 BY DRT     Staphylococcus epidermidis NOT DETECTED NOT DETECTED Final   Staphylococcus lugdunensis NOT DETECTED NOT DETECTED Final   Streptococcus species NOT DETECTED NOT DETECTED Final   Streptococcus agalactiae NOT DETECTED NOT DETECTED Final   Streptococcus pneumoniae NOT DETECTED NOT DETECTED Final   Streptococcus pyogenes NOT DETECTED NOT DETECTED Final   A.calcoaceticus-baumannii NOT DETECTED NOT DETECTED Final   Bacteroides fragilis NOT DETECTED NOT DETECTED Final   Enterobacterales NOT DETECTED NOT DETECTED Final   Enterobacter cloacae complex NOT DETECTED NOT DETECTED Final   Escherichia coli NOT DETECTED NOT DETECTED Final   Klebsiella aerogenes NOT DETECTED NOT DETECTED Final   Klebsiella oxytoca NOT DETECTED NOT DETECTED Final   Klebsiella pneumoniae  NOT  DETECTED NOT DETECTED Final   Proteus species NOT DETECTED NOT DETECTED Final   Salmonella species NOT DETECTED NOT DETECTED Final   Serratia marcescens NOT DETECTED NOT DETECTED Final   Haemophilus influenzae NOT DETECTED NOT DETECTED Final   Neisseria meningitidis NOT DETECTED NOT DETECTED Final   Pseudomonas aeruginosa NOT DETECTED NOT DETECTED Final   Stenotrophomonas maltophilia NOT DETECTED NOT DETECTED Final   Candida albicans NOT DETECTED NOT DETECTED Final   Candida auris NOT DETECTED NOT DETECTED Final   Candida glabrata NOT DETECTED NOT DETECTED Final   Candida krusei NOT DETECTED NOT DETECTED Final   Candida parapsilosis NOT DETECTED NOT DETECTED Final   Candida tropicalis NOT DETECTED NOT DETECTED Final   Cryptococcus neoformans/gattii NOT DETECTED NOT DETECTED Final   Meth resistant mecA/C and MREJ NOT DETECTED NOT DETECTED Final    Comment: Performed at [facility], [address] [place], [place] [address]  Blood culture (routine x 2)     Status: Abnormal   Collection Time: 10/14/23 11:06 PM   Specimen: BLOOD  Result Value Ref Range Status   Specimen Description   Final    BLOOD BLOOD RIGHT ARM Performed at [facility], [address] [place], [place] [address]    Special Requests   Final    BOTTLES DRAWN AEROBIC AND ANAEROBIC Blood Culture adequate volume Performed at [facility], [address] [place], [place] [address]    Culture  Setup Time   Final    GRAM POSITIVE COCCI IN BOTH AEROBIC AND ANAEROBIC BOTTLES Gram Stain Report Called to,Read Back By and Verified With: Wilburt Finlay, RN AT 1123 10/15/23 BY A. SNYDER    Culture (A)  Final    STAPHYLOCOCCUS AUREUS SUSCEPTIBILITIES PERFORMED ON PREVIOUS CULTURE WITHIN THE LAST 5 DAYS. Performed at [facility], [address] [place], [place] [address]    Report Status 10/17/2023 FINAL  Final  Culture, blood (single) w Reflex to ID Panel     Status: None (Preliminary result)   Collection  Time: 10/15/23  5:41 AM   Specimen: BLOOD  Result Value Ref Range Status   Specimen Description BLOOD BLOOD RIGHT HAND  Final   Special Requests   Final    BOTTLES DRAWN AEROBIC ONLY Blood Culture adequate volume   Culture   Final    NO GROWTH 3 DAYS Performed at [facility], [address] [place], [place] [address]    Report Status PENDING  Incomplete  Culture, blood (Routine X 2) w Reflex to ID Panel     Status: None (Preliminary result)   Collection Time: 10/16/23  4:08 AM   Specimen: BLOOD  Result Value Ref Range Status   Specimen Description BLOOD BLOOD LEFT HAND  Final   Special Requests   Final    BOTTLES DRAWN AEROBIC AND ANAEROBIC Blood Culture adequate volume   Culture   Final    NO GROWTH 2 DAYS Performed at [facility], [address] [place], [place] [address]    Report Status PENDING  Incomplete  Culture, blood (Routine X 2) w Reflex to ID Panel     Status: None (Preliminary result)   Collection Time: 10/16/23  4:08 AM   Specimen: BLOOD  Result Value Ref Range Status   Specimen Description BLOOD BLOOD LEFT WRIST  Final   Special Requests   Final    BOTTLES DRAWN AEROBIC AND ANAEROBIC Blood Culture adequate volume   Culture   Final    NO GROWTH  2 DAYS Performed at [facility], [address] [place], [place] [address]    Report Status PENDING  Incomplete     Radiology Studies: ECHO TEE Result Date: 10/17/2023    TRANSESOPHOGEAL ECHO REPORT   Patient Name:   Pam Soto Date of Exam: 10/17/2023 Medical Rec #:  [id number]      Height:       67.0 in Accession #:    [id number]     Weight:       180.0 lb Date of Birth:  April 28, 1991       BSA:          1.934 m Patient Age:    32 years       BP:           131/88 mmHg Patient Gender: F              HR:           80 bpm. [facility] Procedure: Cardiac Doppler, Color Doppler and Transesophageal Echo (Both            Spectral and Color Flow Doppler were utilized during procedure). Indications:    Bacteremia  R78.81  History:        Patient has prior history of Echocardiogram examinations, most                 recent 10/15/2023. Hx of IV drug Use.  Sonographer:    Celesta Gentile RCS Referring Phys: [id number] Ellsworth Lennox PROCEDURE: The transesophogeal probe was passed without difficulty through the esophogus of the patient. Imaged were obtained with the patient in a left lateral decubitus position. Sedation performed by different physician. Image quality was good. The patient's vital signs; including heart rate, blood pressure, and oxygen saturation; remained stable throughout the procedure. Supplementary images were obtained from transthoracic windows as indicated to answer the clinical question. The patient developed no complications during the procedure.  IMPRESSIONS  1. Left ventricular ejection fraction, by estimation, is 60 to 65%. The left ventricle has normal function. The left ventricle has no regional wall motion abnormalities. Left ventricular diastolic function could not be evaluated.  2. Right ventricular systolic function is normal. The right ventricular size is normal.  3. No left atrial/left atrial appendage thrombus was detected. The LAA emptying velocity was 90 cm/s.  4. The mitral valve is normal in structure. Trivial mitral valve regurgitation. No evidence of mitral stenosis.  5. The aortic valve is tricuspid. Aortic valve regurgitation is not visualized. No aortic stenosis is present. Conclusion(s)/Recommendation(s): No evidence of vegetation/infective endocarditis on this transesophageael echocardiogram. FINDINGS  Left Ventricle: Left ventricular ejection fraction, by estimation, is 60 to 65%. The left ventricle has normal function. The left ventricle has no regional wall motion abnormalities. Strain was performed and the global longitudinal strain is indeterminate. The left ventricular internal cavity size was normal in size. There is no left ventricular hypertrophy. Left ventricular diastolic  function could not be evaluated. Right Ventricle: The right ventricular size is normal. No increase in right ventricular wall thickness. Right ventricular systolic function is normal. Left Atrium: Left atrial size was not assessed. No left atrial/left atrial appendage thrombus was detected. The LAA emptying velocity was 90 cm/s. Right Atrium: Right atrial size was not assessed. Pericardium: There is no evidence of pericardial effusion. Mitral Valve: The mitral valve is normal in structure. Trivial mitral valve regurgitation. No evidence of mitral valve stenosis. Tricuspid Valve: The tricuspid valve is normal  in structure. Tricuspid valve regurgitation is trivial. No evidence of tricuspid stenosis. Aortic Valve: The aortic valve is tricuspid. Aortic valve regurgitation is not visualized. No aortic stenosis is present. Pulmonic Valve: The pulmonic valve was normal in structure. Pulmonic valve regurgitation is not visualized. No evidence of pulmonic stenosis. Aorta: The aortic root is normal in size and structure. Venous: The inferior vena cava was not well visualized. IAS/Shunts: No atrial level shunt detected by color flow Doppler. Additional Comments: Spectral Doppler performed. Vishnu Priya Mallipeddi Electronically signed by Winfield Rast Mallipeddi Signature Date/Time: 10/17/2023/3:04:14 PM    Final     Scheduled Meds:  enoxaparin (LOVENOX) injection  40 mg Subcutaneous Q24H   gabapentin  200 mg Oral TID   ketorolac  30 mg Intravenous Q8H   lidocaine  1 patch Transdermal Q24H   methadone  40 mg Oral Daily   nicotine  21 mg Transdermal Daily   pantoprazole  40 mg Oral Daily   sodium chloride flush  3 mL Intravenous Q12H   Continuous Infusions:   ceFAZolin (ANCEF) IV 2 g (10/18/23 0514)     LOS: 3 days    Total time spent interpreting labs and vitals, coordinating care amongst consultants and care team members, directly assessing and discussing care with the patient and/or family: 55  min   Debarah Crape, DO [facility]  To contact the attending physician between 7A-7P please use Epic Chat. To contact the covering physician during after hours 7P-7A, please review Amion.   10/18/2023, 9:05 AM   *This document has been created with the assistance of dictation software. Please excuse typographical errors. *

## 2023-10-18 NOTE — Progress Notes (Addendum)
   10/18/23 1648  Assess: MEWS Score  Temp (!) 102.1 F (38.9 C)  BP (!) 157/80  MAP (mmHg) 98  Pulse Rate (!) 105  Resp (!) 36  Level of Consciousness Alert  SpO2 95 %  O2 Device Room Air  Assess: MEWS Score  MEWS Temp 2  MEWS Systolic 0  MEWS Pulse 1  MEWS RR 3  MEWS LOC 0  MEWS Score 6  MEWS Score Color Red  Assess: if the MEWS score is Yellow or Red  Were vital signs accurate and taken at a resting state? Yes  Does the patient meet 2 or more of the SIRS criteria? No  Does the patient have a confirmed or suspected source of infection? Yes  MEWS guidelines implemented  Yes, red  Treat  MEWS Interventions Considered administering scheduled or prn medications/treatments as ordered  Take Vital Signs  Increase Vital Sign Frequency  Red: Q1hr x2, continue Q4hrs until patient remains green for 12hrs  Escalate  MEWS: Escalate Red: Discuss with charge nurse and notify provider. Consider notifying RRT. If remains red for 2 hours consider need for higher level of care  Notify: Charge Nurse/RN  Name of Charge Nurse/RN Notified Thomas Hoff, RN  Provider Notification  Provider Name/Title Dr. Rennis Chris  Date Provider Notified 10/18/23  Time Provider Notified 1648  Method of Notification Page  Notification Reason Change in status  Provider response At bedside;No new orders  Date of Provider Response 10/18/23  Time of Provider Response 1650  Assess: SIRS CRITERIA  SIRS Temperature  1  SIRS Respirations  1  SIRS Pulse 1  SIRS WBC 0  SIRS Score Sum  3   Pt red mews r/t pain, scheduled Toradol given.

## 2023-10-18 NOTE — Progress Notes (Deleted)
 Scheduled Toradol given was effective. Pt is back to a green mews. Dr. Rennis Chris and charge nurse Crystal notified.

## 2023-10-18 NOTE — Plan of Care (Signed)

## 2023-10-18 NOTE — Progress Notes (Signed)
  Scheduled Toradol given was effective. Pt is back to a green mews. Dr. Rennis Chris and charge nurse Crystal notified.       10/18/23 1759  Assess: MEWS Score  Temp 98.9 F (37.2 C)  BP 119/76  MAP (mmHg) 90  Pulse Rate 94  Resp 20  Level of Consciousness Alert  SpO2 96 %  O2 Device Room Air  Assess: MEWS Score  MEWS Temp 0  MEWS Systolic 0  MEWS Pulse 0  MEWS RR 0  MEWS LOC 0  MEWS Score 0  MEWS Score Color Green  Assess: SIRS CRITERIA  SIRS Temperature  0  SIRS Respirations  0  SIRS Pulse 1  SIRS WBC 0  SIRS Score Sum  1

## 2023-10-19 ENCOUNTER — Inpatient Hospital Stay (HOSPITAL_COMMUNITY): Payer: MEDICAID

## 2023-10-19 DIAGNOSIS — E876 Hypokalemia: Secondary | ICD-10-CM | POA: Diagnosis not present

## 2023-10-19 DIAGNOSIS — F1910 Other psychoactive substance abuse, uncomplicated: Secondary | ICD-10-CM | POA: Diagnosis not present

## 2023-10-19 DIAGNOSIS — R9431 Abnormal electrocardiogram [ECG] [EKG]: Secondary | ICD-10-CM | POA: Diagnosis not present

## 2023-10-19 LAB — COMPREHENSIVE METABOLIC PANEL
ALT: 12 U/L (ref 0–44)
AST: 17 U/L (ref 15–41)
Albumin: 2.2 g/dL — ABNORMAL LOW (ref 3.5–5.0)
Alkaline Phosphatase: 68 U/L (ref 38–126)
Anion gap: 10 (ref 5–15)
BUN: 8 mg/dL (ref 6–20)
CO2: 25 mmol/L (ref 22–32)
Calcium: 8.4 mg/dL — ABNORMAL LOW (ref 8.9–10.3)
Chloride: 103 mmol/L (ref 98–111)
Creatinine, Ser: 0.56 mg/dL (ref 0.44–1.00)
GFR, Estimated: >60 mL/min (ref >60)
Glucose, Bld: 94 mg/dL (ref 70–99)
Potassium: 3.6 mmol/L (ref 3.5–5.1)
Sodium: 138 mmol/L (ref 135–145)
Total Bilirubin: <0.2 mg/dL (ref 0.0–1.2)
Total Protein: 6.4 g/dL — ABNORMAL LOW (ref 6.5–8.1)

## 2023-10-19 LAB — CBC WITH DIFFERENTIAL/PLATELET
Abs Immature Granulocytes: 0.1 10*3/uL — ABNORMAL HIGH (ref 0.00–0.07)
Basophils Absolute: 0.1 10*3/uL (ref 0.0–0.1)
Basophils Relative: 1 %
Eosinophils Absolute: 0.3 10*3/uL (ref 0.0–0.5)
Eosinophils Relative: 4 %
HCT: 27.8 % — ABNORMAL LOW (ref 36.0–46.0)
Hemoglobin: 9.1 g/dL — ABNORMAL LOW (ref 12.0–15.0)
Immature Granulocytes: 1 %
Lymphocytes Relative: 33 %
Lymphs Abs: 2.7 10*3/uL (ref 0.7–4.0)
MCH: 29.3 pg (ref 26.0–34.0)
MCHC: 32.7 g/dL (ref 30.0–36.0)
MCV: 89.4 fL (ref 80.0–100.0)
Monocytes Absolute: 0.6 10*3/uL (ref 0.1–1.0)
Monocytes Relative: 7 %
Neutro Abs: 4.3 10*3/uL (ref 1.7–7.7)
Neutrophils Relative %: 54 %
Platelets: 326 10*3/uL (ref 150–400)
RBC: 3.11 MIL/uL — ABNORMAL LOW (ref 3.87–5.11)
RDW: 12.7 % (ref 11.5–15.5)
WBC: 8.1 10*3/uL (ref 4.0–10.5)
nRBC: 0 % (ref 0.0–0.2)

## 2023-10-19 LAB — MRSA NEXT GEN BY PCR, NASAL: MRSA by PCR Next Gen: NOT DETECTED

## 2023-10-19 LAB — PHOSPHORUS: Phosphorus: 4.7 mg/dL — ABNORMAL HIGH (ref 2.5–4.6)

## 2023-10-19 LAB — MAGNESIUM: Magnesium: 1.9 mg/dL (ref 1.7–2.4)

## 2023-10-19 MED ORDER — IOHEXOL 300 MG/ML  SOLN
75.0000 mL | Freq: Once | INTRAMUSCULAR | Status: AC | PRN
  Administered 2023-10-19: 75 mL via INTRAVENOUS

## 2023-10-19 MED ORDER — SODIUM CHLORIDE 0.9 % IV SOLN
3.0000 g | Freq: Four times a day (QID) | INTRAVENOUS | Status: AC
  Administered 2023-10-19 – 2023-11-10 (×90): 3 g via INTRAVENOUS
  Filled 2023-10-19 (×89): qty 8

## 2023-10-19 MED ORDER — ACETAMINOPHEN 500 MG PO TABS
1000.0000 mg | ORAL_TABLET | Freq: Four times a day (QID) | ORAL | Status: DC
  Administered 2023-10-19 – 2023-11-11 (×84): 1000 mg via ORAL
  Filled 2023-10-19 (×90): qty 2

## 2023-10-19 MED ORDER — OXYCODONE HCL 5 MG PO TABS
15.0000 mg | ORAL_TABLET | ORAL | Status: DC | PRN
  Administered 2023-10-19 – 2023-10-21 (×7): 15 mg via ORAL
  Filled 2023-10-19 (×8): qty 3

## 2023-10-19 MED ORDER — KETOROLAC TROMETHAMINE 30 MG/ML IJ SOLN
30.0000 mg | Freq: Three times a day (TID) | INTRAMUSCULAR | Status: AC
  Administered 2023-10-19 – 2023-10-21 (×6): 30 mg via INTRAVENOUS
  Filled 2023-10-19 (×6): qty 1

## 2023-10-19 NOTE — Consult Note (Signed)
 Pharmacy Antibiotic Note  Pam Soto is a 33 y.o. female admitted on 10/14/2023 with empyema/pneumonia.  Pharmacy has been consulted for Unasyn dosing.  Plan: Unasyn 3 grams IV every 6 hours Follow renal function and cultures for further adjustments  Height: 5\' 7"  (170.2 cm) Weight: 81.6 kg (180 lb) IBW/kg (Calculated) : 61.6  Temp (24hrs), Avg:99.3 F (37.4 C), Min:98.2 F (36.8 C), Max:102.1 F (38.9 C)  Recent Labs  Lab 10/14/23 2306 10/15/23 0519 10/15/23 1549 10/16/23 0408 10/17/23 0316 10/18/23 0505 10/19/23 0612  WBC  --  11.0*  --  11.0* 8.4 8.7 8.1  CREATININE  --  0.77 0.59 0.60 0.55 0.66 0.56  LATICACIDVEN 1.2  --   --   --   --   --   --     Estimated Creatinine Clearance: 113 mL/min (by C-G formula based on SCr of 0.56 mg/dL).    Allergies  Allergen Reactions   Tramadol Hives    Antimicrobials this admission: cefazolin 3/6 >> 3/9 ceftriaxone 3/5 >> 3/6 Unasyn 3/9 >>  Dose adjustments this admission: N/A  Microbiology results: 3/4 BCx: 3/4 MSSA 3/6 Bcx: ngtd 3/9 MRSA PCR: ordered  Thank you for allowing pharmacy to be a part of this patient's care.  Barrie Folk, PharmD 10/19/2023 4:10 PM

## 2023-10-19 NOTE — Progress Notes (Signed)
 PROGRESS NOTE    Pam Soto  ZOX:[id number] DOB: 05-31-1991 DOA: 10/14/2023 PCP: Mechele Claude, MD  No chief complaint on file.   Hospital Course:  Pam Soto is 33 y.o. female with history of IV drug abuse who presents with right-sided chest pain and shortness of breath.  Patient endorses muscle cramps over the past few days which she believes are secondary to depleted electrolytes.  She reports her pleuritic chest pain has become severe and thus presented to the hospital.  She reports she has been trying to abstain from illicit substances and has been taking methadone.  She was concerned that her musculoskeletal pain was a result of withdrawal and she injected fentanyl yesterday.  On arrival to the ED patient was febrile, saturating the mid 90s on room air, tachypneic, tachycardic.  Labs reveal hyponatremia to 129, hypokalemia to 2.9, hypomagnesemia 1.6, leukocytosis 12.4 K.  Respiratory viral panel was negative.  Chest x-ray concerning for multifocal pneumonia.  CTA concerning for multifocal infiltrates which may be septic emboli.  Blood cultures were collected, the patient was started on IV fluids.  Subjective: Fever 102 overnight, severe right-sided pleuritic chest pain.  She reports the pain is getting worse not better.  She is requesting to go back to the 60 mg of Toradol which she reports is the only thing that is helping at this time.   Objective: Vitals:   10/18/23 1759 10/18/23 1845 10/18/23 2109 10/19/23 0444  BP: 119/76 118/70 118/71 123/85  Pulse: 94 84 82 82  Resp: 20 19 18 20   Temp: 98.9 F (37.2 C) 99.2 F (37.3 C) 98.7 F (37.1 C) 98.4 F (36.9 C)  TempSrc: Oral Oral Oral Oral  SpO2: 96% 98% 98% 94%  Weight:      Height:        Intake/Output Summary (Last 24 hours) at 10/19/2023 0925 Last data filed at 10/19/2023 0510 Gross per 24 hour  Intake 963 ml  Output --  Net 963 ml   Filed Weights   10/14/23 2300 10/17/23 1151  Weight: 81.6 kg 81.6 kg     Examination: General exam: Appears calm and uncomfortable.  Respiratory system: No work of breathing, symmetric chest wall expansion Cardiovascular system: S1 & S2 heard, RRR.  Gastrointestinal system: Abdomen is nondistended, soft and nontender.  Neuro: Alert and oriented. No focal neurological deficits. Extremities: Symmetric, expected ROM Skin: No rashes, lesions Psychiatry: Demonstrates appropriate judgement and insight. Mood & affect appropriate for situation.   Assessment & Plan:  Principal Problem:   Pneumonia Active Problems:   Hyponatremia   Hypokalemia   Hypomagnesemia   Prolonged QT interval   IV drug abuse (HCC)   Multifocal pneumonia   Gram-positive cocci bacteremia    Sepsis -Criteria met on arrival leukocytosis, tachycardia, tachypnea.  Source: Pneumonia and bacteremia as above - On IV fluids -- Abx as below  Multifocal pneumonia MSSA Bacteremia -- Febrile again to 102. Repeat CT today - Multifocal infiltrates noted in right lower lobe on initial CTA.  Concerning for septic emboli - 3/4 cultures positive for MSSA bacteremia. Repeat 3/6 cultures still negative, follow. - Sputum culture ordered, appears not collected - TTE without evidence of endocarditis, TEE also negative - Infectious disease consulted.  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 - Will need to continue on cefazolin for 4 weeks from 3/5 with end of treatment on 4/1 followed by dose of oritavancin/dalbavancin  on 4/2 to complete the 6-week course. - Unfortunately given patient's active IV drug use she is not a candidate for PICC therapy and will need to remain in the supervised setting for the duration of her treatment. She is amendable to this.  -- Encouraged IS and FV - HIV negative -- HepB non immune, consider vaccination when out of acute illness   Hepatitis C infection -- Quant  199,000 -- Will need outpatient treatment  Normocytic Anemia - Iron studies ordered -- Hgb stable, trend -- Transfuse <7  Polysubstance Abuse IV drug abuse - Almost daily use of heroin/fentanyl, cocaine.  Occasional use of benzodiazepines though not consistently.  Denies any alcohol use - Patient has been working towards abstinence and is on methadone outpatient.  She did use IV fentanyl the day prior to arrival.  She desires continued abstinence and would like medication assisted withdrawal while admitted - Unclear if fever is component of withdrawal, unusual for such high-grade fever with opioid withdrawal.  Patient endorses occasional Klonopin use though nothing consistent.  Doubt she is withdrawing from benzos. -- Resume home dose methadone for now - [facility] consulted for resource assistance  Right side Pleuritis -- Severe and worsening, repeat CT Chest today. Concern for lung infarction vs worsening emboli -- Cont methadone, Toradol, gabapentin, tylenol, lidocaine patches.  -- PRN Oxy. Try to avoid additional narcotics. Wean Oxy as able  Hypokalemia Hypomagnesemia - Replace as needed - Follow BMP  Prolonged QT - Continue to optimize electrolytes, avoid QT prolonging meds - Repeat EKG 3/5 shows Qtc improved to normal  Hyponatremia - Continue IV hydration with NS  AKI, POA - now resolved.  DVT prophylaxis: Lovenox   Code Status: Full Code Family Communication:  Discussed directly with patient  Disposition:  Inpatient, still hospitalized for IV abx, will need until 4/1, not a candidate for PICC therapy. Will need to remain in house until 4/1.  Consultants:  Treatment Team:  Consulting Physician: Odette Fraction, MD Consulting Physician: Marjo Bicker, MD  Procedures:    Antimicrobials:  Anti-infectives (From admission, onward)    Start     Dose/Rate Route Frequency Ordered Stop   10/16/23 0915  ceFAZolin (ANCEF) IVPB 2g/100 mL premix        2 g 200 mL/hr  over 30 Minutes Intravenous Every 8 hours 10/16/23 0826     10/15/23 1000  vancomycin (VANCOCIN) IVPB 1000 mg/200 mL premix  Status:  Discontinued        1,000 mg 200 mL/hr over 60 Minutes Intravenous Every 12 hours 10/15/23 0406 10/16/23 0826   10/15/23 0600  cefTRIAXone (ROCEPHIN) 2 g in sodium chloride 0.9 % 100 mL IVPB  Status:  Discontinued        2 g 200 mL/hr over 30 Minutes Intravenous Every 24 hours 10/15/23 0354 10/16/23 0826   10/15/23 0400  doxycycline (VIBRAMYCIN) 100 mg in sodium chloride 0.9 % 250 mL IVPB  Status:  Discontinued        100 mg 125 mL/hr over 120 Minutes Intravenous Every 12 hours 10/15/23 0354 10/16/23 0826   10/14/23 2315  ceFEPIme (MAXIPIME) 2 g in sodium chloride 0.9 % 100 mL IVPB        2 g 200 mL/hr over 30 Minutes Intravenous  Once 10/14/23 2303 10/15/23 0109   10/14/23 2315  Vancomycin (VANCOCIN) 1,500 mg in sodium chloride 0.9 % 500 mL IVPB        1,500 mg 250 mL/hr over 120 Minutes Intravenous  Once 10/14/23 2304  10/15/23 0228       Data Reviewed: I have personally reviewed following labs and imaging studies CBC: Recent Labs  Lab 10/15/23 0519 10/16/23 0408 10/17/23 0316 10/18/23 0505 10/19/23 0612  WBC 11.0* 11.0* 8.4 8.7 8.1  NEUTROABS  --  7.5 5.1 5.0 4.3  HGB 10.3* 9.7* 9.4* 10.8* 9.1*  HCT 31.1* 30.5* 29.7* 35.5* 27.8*  MCV 88.6 90.8 91.4 93.9 89.4  PLT 191 182 215 310 326   Basic Metabolic Panel: Recent Labs  Lab 10/14/23 2138 10/15/23 0519 10/15/23 1549 10/16/23 0408 10/17/23 0316 10/18/23 0505 10/19/23 0612  NA 129*   < > 132* 134* 137 139 138  K 2.9*   < > 4.2 3.7 3.5 3.5 3.6  CL 97*   < > 101 109 107 106 103  CO2 20*   < > 23 21* 24 26 25   GLUCOSE 105*   < > 109* 114* 96 78 94  BUN 15   < > 12 8 6 8 8   CREATININE 1.01*   < > 0.59 0.60 0.55 0.66 0.56  CALCIUM 8.2*   < > 8.1* 7.9* 7.8* 8.3* 8.4*  MG 1.6*  --   --  1.9 1.7 2.0 1.9  PHOS  --   --   --  2.3* 3.3 4.5 4.7*   < > = values in this interval not displayed.    GFR: Estimated Creatinine Clearance: 113 mL/min (by C-G formula based on SCr of 0.56 mg/dL). Liver Function Tests: Recent Labs  Lab 10/14/23 2138 10/16/23 0408 10/17/23 0316 10/18/23 0505 10/19/23 0612  AST 22 21 17 20 17   ALT 22 18 13 15 12   ALKPHOS 62 62 58 83 68  BILITOT 0.9 0.4 <0.2 0.2 <0.2  PROT 7.6 5.9* 5.6* 6.9 6.4*  ALBUMIN 3.6 2.5* 2.2* 2.6* 2.2*   CBG: No results for input(s): "GLUCAP" in the last 168 hours.  Recent Results (from the past 240 hours)  Resp panel by RT-PCR (RSV, Flu A&B, Covid) Anterior Nasal Swab     Status: None   Collection Time: 10/14/23  8:56 PM   Specimen: Anterior Nasal Swab  Result Value Ref Range Status   SARS Coronavirus 2 by RT PCR NEGATIVE NEGATIVE Final    Comment: (NOTE) SARS-CoV-2 target nucleic acids are NOT DETECTED.  The SARS-CoV-2 RNA is generally detectable in upper respiratory specimens during the acute phase of infection. The lowest concentration of SARS-CoV-2 viral copies this assay can detect is 138 copies/mL. A negative result does not preclude SARS-Cov-2 infection and should not be used as the sole basis for treatment or other patient management decisions. A negative result may occur with  improper specimen collection/handling, submission of specimen other than nasopharyngeal swab, presence of viral mutation(s) within the areas targeted by this assay, and inadequate number of viral copies(<138 copies/mL). A negative result must be combined with clinical observations, patient history, and epidemiological information. The expected result is Negative.  Fact Sheet for Patients:  BloggerCourse.com  Fact Sheet for Healthcare Providers:  SeriousBroker.it  This test is no t yet approved or cleared by the [place] FDA and  has been authorized for detection and/or diagnosis of SARS-CoV-2 by FDA under an Emergency Use Authorization (EUA). This EUA will remain  in  effect (meaning this test can be used) for the duration of the COVID-19 declaration under Section 564(b)(1) of the Act, 21 U.S.C.section 360bbb-3(b)(1), unless the authorization is terminated  or revoked sooner.       Influenza A  by PCR NEGATIVE NEGATIVE Final   Influenza B by PCR NEGATIVE NEGATIVE Final    Comment: (NOTE) The Xpert Xpress SARS-CoV-2/FLU/RSV plus assay is intended as an aid in the diagnosis of influenza from Nasopharyngeal swab specimens and should not be used as a sole basis for treatment. Nasal washings and aspirates are unacceptable for Xpert Xpress SARS-CoV-2/FLU/RSV testing.  Fact Sheet for Patients: BloggerCourse.com  Fact Sheet for Healthcare Providers: SeriousBroker.it  This test is not yet approved or cleared by the [place] FDA and has been authorized for detection and/or diagnosis of SARS-CoV-2 by FDA under an Emergency Use Authorization (EUA). This EUA will remain in effect (meaning this test can be used) for the duration of the COVID-19 declaration under Section 564(b)(1) of the Act, 21 U.S.C. section 360bbb-3(b)(1), unless the authorization is terminated or revoked.     Resp Syncytial Virus by PCR NEGATIVE NEGATIVE Final    Comment: (NOTE) Fact Sheet for Patients: BloggerCourse.com  Fact Sheet for Healthcare Providers: SeriousBroker.it  This test is not yet approved or cleared by the [place] FDA and has been authorized for detection and/or diagnosis of SARS-CoV-2 by FDA under an Emergency Use Authorization (EUA). This EUA will remain in effect (meaning this test can be used) for the duration of the COVID-19 declaration under Section 564(b)(1) of the Act, 21 U.S.C. section 360bbb-3(b)(1), unless the authorization is terminated or revoked.  Performed at [facility], [address], [place], [place] [address]   Blood culture (routine  x 2)     Status: Abnormal   Collection Time: 10/14/23 11:03 PM   Specimen: BLOOD  Result Value Ref Range Status   Specimen Description   Final    BLOOD BLOOD RIGHT ARM Performed at [facility], [address], [place], [place] [address]    Special Requests   Final    BOTTLES DRAWN AEROBIC AND ANAEROBIC Blood Culture adequate volume Performed at [facility], [address], [place], [place] [address]    Culture  Setup Time   Final    GRAM POSITIVE COCCI ANAEROBIC BOTTLE ONLY Gram Stain Report Called to,Read Back By and Verified With: Wilburt Finlay, RN AT 1123 10/15/23 BY A. SNYDER AEROBIC BOTTLE ONLY GRAM POSITIVE COCCI GRAM STAIN REVIEWED-AGREE WITH RESULT CRITICAL RESULT CALLED TO, READ BACK BY AND VERIFIED WITH: RN RENEE TEJEDA ON 10/15/23 @ 1733 BY DRT  Performed at [facility], [address], [place], [place] [address]    Culture STAPHYLOCOCCUS AUREUS (A)  Final   Report Status 10/17/2023 FINAL  Final   Organism ID, Bacteria STAPHYLOCOCCUS AUREUS  Final      Susceptibility   Staphylococcus aureus - MIC*    CIPROFLOXACIN >=8 RESISTANT Resistant     ERYTHROMYCIN >=8 RESISTANT Resistant     GENTAMICIN <=0.5 SENSITIVE Sensitive     OXACILLIN 0.5 SENSITIVE Sensitive     TETRACYCLINE <=1 SENSITIVE Sensitive     VANCOMYCIN <=0.5 SENSITIVE Sensitive     TRIMETH/SULFA >=320 RESISTANT Resistant     CLINDAMYCIN <=0.25 SENSITIVE Sensitive     RIFAMPIN <=0.5 SENSITIVE Sensitive     Inducible Clindamycin NEGATIVE Sensitive     LINEZOLID 2 SENSITIVE Sensitive     * STAPHYLOCOCCUS AUREUS  Blood Culture ID Panel (Reflexed)     Status: Abnormal   Collection Time: 10/14/23 11:03 PM  Result Value Ref Range Status   Enterococcus faecalis NOT DETECTED NOT DETECTED Final   Enterococcus Faecium NOT DETECTED NOT DETECTED Final   Listeria monocytogenes NOT  DETECTED NOT DETECTED Final   Staphylococcus species DETECTED (A) NOT DETECTED Final    Comment: CRITICAL RESULT CALLED TO, READ BACK  BY AND VERIFIED WITH: RN RENEE TEJEDA ON 10/15/23 @ 1733 BY DRT     Staphylococcus aureus (BCID) DETECTED (A) NOT DETECTED Final    Comment: CRITICAL RESULT CALLED TO, READ BACK BY AND VERIFIED WITH: RN RENEE TEJEDA ON 10/15/23 @ 1733 BY DRT     Staphylococcus epidermidis NOT DETECTED NOT DETECTED Final   Staphylococcus lugdunensis NOT DETECTED NOT DETECTED Final   Streptococcus species NOT DETECTED NOT DETECTED Final   Streptococcus agalactiae NOT DETECTED NOT DETECTED Final   Streptococcus pneumoniae NOT DETECTED NOT DETECTED Final   Streptococcus pyogenes NOT DETECTED NOT DETECTED Final   A.calcoaceticus-baumannii NOT DETECTED NOT DETECTED Final   Bacteroides fragilis NOT DETECTED NOT DETECTED Final   Enterobacterales NOT DETECTED NOT DETECTED Final   Enterobacter cloacae complex NOT DETECTED NOT DETECTED Final   Escherichia coli NOT DETECTED NOT DETECTED Final   Klebsiella aerogenes NOT DETECTED NOT DETECTED Final   Klebsiella oxytoca NOT DETECTED NOT DETECTED Final   Klebsiella pneumoniae NOT DETECTED NOT DETECTED Final   Proteus species NOT DETECTED NOT DETECTED Final   Salmonella species NOT DETECTED NOT DETECTED Final   Serratia marcescens NOT DETECTED NOT DETECTED Final   Haemophilus influenzae NOT DETECTED NOT DETECTED Final   Neisseria meningitidis NOT DETECTED NOT DETECTED Final   Pseudomonas aeruginosa NOT DETECTED NOT DETECTED Final   Stenotrophomonas maltophilia NOT DETECTED NOT DETECTED Final   Candida albicans NOT DETECTED NOT DETECTED Final   Candida auris NOT DETECTED NOT DETECTED Final   Candida glabrata NOT DETECTED NOT DETECTED Final   Candida krusei NOT DETECTED NOT DETECTED Final   Candida parapsilosis NOT DETECTED NOT DETECTED Final   Candida tropicalis NOT DETECTED NOT DETECTED Final   Cryptococcus neoformans/gattii NOT DETECTED NOT DETECTED Final   Meth resistant mecA/C and MREJ NOT DETECTED NOT DETECTED Final    Comment: Performed at [facility], [address], [place], [place] [address]  Blood culture (routine x 2)     Status: Abnormal   Collection Time: 10/14/23 11:06 PM   Specimen: BLOOD  Result Value Ref Range Status   Specimen Description   Final    BLOOD BLOOD RIGHT ARM Performed at [facility], [address] [place], [place] [address]    Special Requests   Final    BOTTLES DRAWN AEROBIC AND ANAEROBIC Blood Culture adequate volume Performed at [facility], [address], [place], [place] [address]    Culture  Setup Time   Final    GRAM POSITIVE COCCI IN BOTH AEROBIC AND ANAEROBIC BOTTLES Gram Stain Report Called to,Read Back By and Verified With: Wilburt Finlay, RN AT 1123 10/15/23 BY A. SNYDER    Culture (A)  Final    STAPHYLOCOCCUS AUREUS SUSCEPTIBILITIES PERFORMED ON PREVIOUS CULTURE WITHIN THE LAST 5 DAYS. Performed at [facility], [address], [place], [place] [address]    Report Status 10/17/2023 FINAL  Final  Culture, blood (single) w Reflex to ID Panel     Status: None (Preliminary result)   Collection Time: 10/15/23  5:41 AM   Specimen: BLOOD  Result Value Ref Range Status   Specimen Description BLOOD BLOOD RIGHT HAND  Final   Special Requests   Final    BOTTLES DRAWN AEROBIC ONLY Blood Culture adequate volume   Culture   Final    NO GROWTH 4  DAYS Performed at [facility], [address], [place], [place] [address]    Report Status PENDING  Incomplete  Culture, blood (Routine X 2) w Reflex to ID Panel     Status: None (Preliminary result)   Collection Time: 10/16/23  4:08 AM   Specimen: BLOOD  Result Value Ref Range Status   Specimen Description BLOOD BLOOD LEFT HAND  Final   Special Requests   Final    BOTTLES DRAWN AEROBIC AND ANAEROBIC Blood Culture adequate volume   Culture   Final    NO GROWTH 3 DAYS Performed at [facility], [address] [place], [place] [address]    Report Status PENDING  Incomplete  Culture, blood (Routine X 2) w Reflex to ID Panel     Status: None  (Preliminary result)   Collection Time: 10/16/23  4:08 AM   Specimen: BLOOD  Result Value Ref Range Status   Specimen Description BLOOD BLOOD LEFT WRIST  Final   Special Requests   Final    BOTTLES DRAWN AEROBIC AND ANAEROBIC Blood Culture adequate volume   Culture   Final    NO GROWTH 3 DAYS Performed at [facility], [address], [place], [place] [address]    Report Status PENDING  Incomplete     Radiology Studies: ECHO TEE Result Date: 10/17/2023    TRANSESOPHOGEAL ECHO REPORT   Patient Name:   JAMEYAH FENNEWALD Date of Exam: 10/17/2023 Medical Rec #:  [id number]      Height:       67.0 in Accession #:    [id number]     Weight:       180.0 lb Date of Birth:  [birth year]       BSA:          1.934 m Patient Age:    32 years       BP:           131/88 mmHg Patient Gender: F              HR:           80 bpm. [facility] Procedure: Cardiac Doppler, Color Doppler and Transesophageal Echo (Both            Spectral and Color Flow Doppler were utilized during procedure). Indications:    Bacteremia R78.81  History:        Patient has prior history of Echocardiogram examinations, most                 recent 10/15/2023. Hx of IV drug Use.  Sonographer:    Celesta Gentile RCS Referring Phys: [id number] Ellsworth Lennox PROCEDURE: The transesophogeal probe was passed without difficulty through the esophogus of the patient. Imaged were obtained with the patient in a left lateral decubitus position. Sedation performed by different physician. Image quality was good. The patient's vital signs; including heart rate, blood pressure, and oxygen saturation; remained stable throughout the procedure. Supplementary images were obtained from transthoracic windows as indicated to answer the clinical question. The patient developed no complications during the procedure.  IMPRESSIONS  1. Left ventricular ejection fraction, by estimation, is 60 to 65%. The left ventricle has normal function. The left ventricle has no  regional wall motion abnormalities. Left ventricular diastolic function could not be evaluated.  2. Right ventricular systolic function is normal. The right ventricular size is normal.  3. No left atrial/left atrial appendage thrombus was detected. The LAA emptying velocity was 90 cm/s.  4. The mitral valve is normal in structure. Trivial mitral valve regurgitation. No evidence of mitral stenosis.  5. The aortic valve is tricuspid. Aortic valve regurgitation is not visualized. No aortic stenosis is present. Conclusion(s)/Recommendation(s): No evidence of vegetation/infective endocarditis on this transesophageael echocardiogram. FINDINGS  Left Ventricle: Left ventricular ejection fraction, by estimation, is 60 to 65%. The left ventricle has normal function. The left ventricle has no regional wall motion abnormalities. Strain was performed and the global longitudinal strain is indeterminate. The left ventricular internal cavity size was normal in size. There is no left ventricular hypertrophy. Left ventricular diastolic function could not be evaluated. Right Ventricle: The right ventricular size is normal. No increase in right ventricular wall thickness. Right ventricular systolic function is normal. Left Atrium: Left atrial size was not assessed. No left atrial/left atrial appendage thrombus was detected. The LAA emptying velocity was 90 cm/s. Right Atrium: Right atrial size was not assessed. Pericardium: There is no evidence of pericardial effusion. Mitral Valve: The mitral valve is normal in structure. Trivial mitral valve regurgitation. No evidence of mitral valve stenosis. Tricuspid Valve: The tricuspid valve is normal in structure. Tricuspid valve regurgitation is trivial. No evidence of tricuspid stenosis. Aortic Valve: The aortic valve is tricuspid. Aortic valve regurgitation is not visualized. No aortic stenosis is present. Pulmonic Valve: The pulmonic valve was normal in structure. Pulmonic valve  regurgitation is not visualized. No evidence of pulmonic stenosis. Aorta: The aortic root is normal in size and structure. Venous: The inferior vena cava was not well visualized. IAS/Shunts: No atrial level shunt detected by color flow Doppler. Additional Comments: Spectral Doppler performed. Vishnu Priya Mallipeddi Electronically signed by Winfield Rast Mallipeddi Signature Date/Time: 10/17/2023/3:04:14 PM    Final     Scheduled Meds:  enoxaparin (LOVENOX) injection  40 mg Subcutaneous Q24H   gabapentin  300 mg Oral TID   ketorolac  15 mg Intravenous Q6H   lidocaine  1 patch Transdermal Q24H   methadone  40 mg Oral Daily   nicotine  21 mg Transdermal Daily   pantoprazole  40 mg Oral Daily   sodium chloride flush  3 mL Intravenous Q12H   Continuous Infusions:   ceFAZolin (ANCEF) IV 2 g (10/19/23 0541)     LOS: 4 days    Total time spent interpreting labs and vitals, coordinating care amongst consultants and care team members, directly assessing and discussing care with the patient and/or family: 55 min   Debarah Crape, DO [facility]  To contact the attending physician between 7A-7P please use Epic Chat. To contact the covering physician during after hours 7P-7A, please review Amion.   10/19/2023, 9:25 AM   *This document has been created with the assistance of dictation software. Please excuse typographical errors. *

## 2023-10-19 NOTE — Significant Event (Signed)
 Repeat CT scan has come back with loculated effusion of the right side.  This is new and has been worsening since arrival despite cefazolin therapy.  Will broaden antibiotics to Unasyn. MRSA PCR pending. Have reached back out to infectious disease, appreciate recommendations. Have discussed with interventional radiology.  Given concern for empyema, they are requesting transfer to [facility] so that patient can remain in house for chest tube if needed after thoracentesis.  Planning for thoracentesis tomorrow morning.     Have discussed care plan with the patient, her nurse, and bed management.  CT CHEST W CONTRAST CLINICAL DATA:  Pneumonia, septic emboli, worsening pleuritic pain, persistent fever.  EXAM: CT CHEST WITH CONTRAST  TECHNIQUE: Multidetector CT imaging of the chest was performed during intravenous contrast administration.  RADIATION DOSE REDUCTION: This exam was performed according to the departmental dose-optimization program which includes automated exposure control, adjustment of the mA and/or kV according to patient size and/or use of iterative reconstruction technique.  CONTRAST:  75mL OMNIPAQUE IOHEXOL 300 MG/ML  SOLN  COMPARISON:  10/14/2023 chest CT angiogram.  FINDINGS: Cardiovascular: Normal heart size. No significant pericardial effusion/thickening. Great vessels are normal in course and caliber. No central pulmonary emboli.  Mediastinum/Nodes: No significant thyroid nodules. Unremarkable esophagus. No pathologically enlarged axillary, mediastinal or hilar lymph nodes.  Lungs/Pleura: No pneumothorax. Loculated small to moderate anterior, posterior and major fissural right pleural effusion, new from prior chest CT. No left pleural effusion. Near complete right lower lobe lung consolidation with some volume loss, substantially worsened. New moderate right middle lobe consolidation with volume loss. Subsegmental atelectasis in the posterior upper lobes  bilaterally and in the left lower lobe.  Upper abdomen: No acute abnormality.  Musculoskeletal:  No aggressive appearing focal osseous lesions.  IMPRESSION: 1. Loculated small to moderate anterior, posterior and major fissural right pleural effusion, new from prior chest CT. 2. Near complete right lower lobe lung consolidation with some volume loss, substantially worsened. New moderate right middle lobe consolidation with volume loss. Findings are compatible with worsened multilobar pneumonia. 3. Subsegmental atelectasis in the posterior upper lobes bilaterally and in the left lower lobe.  Electronically Signed   By: Delbert Phenix M.D.   On: 10/19/2023 13:32

## 2023-10-19 NOTE — Plan of Care (Signed)

## 2023-10-20 ENCOUNTER — Encounter (HOSPITAL_COMMUNITY): Payer: Self-pay | Admitting: Internal Medicine

## 2023-10-20 DIAGNOSIS — E876 Hypokalemia: Secondary | ICD-10-CM | POA: Diagnosis not present

## 2023-10-20 DIAGNOSIS — R9431 Abnormal electrocardiogram [ECG] [EKG]: Secondary | ICD-10-CM | POA: Diagnosis not present

## 2023-10-20 DIAGNOSIS — F1910 Other psychoactive substance abuse, uncomplicated: Secondary | ICD-10-CM | POA: Diagnosis not present

## 2023-10-20 LAB — CBC
HCT: 31.7 % — ABNORMAL LOW (ref 36.0–46.0)
Hemoglobin: 10.4 g/dL — ABNORMAL LOW (ref 12.0–15.0)
MCH: 29 pg (ref 26.0–34.0)
MCHC: 32.8 g/dL (ref 30.0–36.0)
MCV: 88.3 fL (ref 80.0–100.0)
Platelets: 417 10*3/uL — ABNORMAL HIGH (ref 150–400)
RBC: 3.59 MIL/uL — ABNORMAL LOW (ref 3.87–5.11)
RDW: 12.5 % (ref 11.5–15.5)
WBC: 9.8 10*3/uL (ref 4.0–10.5)
nRBC: 0 % (ref 0.0–0.2)

## 2023-10-20 LAB — CULTURE, BLOOD (SINGLE)
Culture: NO GROWTH
Special Requests: ADEQUATE

## 2023-10-20 LAB — CBC WITH DIFFERENTIAL/PLATELET
Abs Immature Granulocytes: 0.07 10*3/uL (ref 0.00–0.07)
Basophils Absolute: 0 10*3/uL (ref 0.0–0.1)
Basophils Relative: 0 %
Eosinophils Absolute: 0.2 10*3/uL (ref 0.0–0.5)
Eosinophils Relative: 3 %
HCT: 31.3 % — ABNORMAL LOW (ref 36.0–46.0)
Hemoglobin: 10.1 g/dL — ABNORMAL LOW (ref 12.0–15.0)
Immature Granulocytes: 1 %
Lymphocytes Relative: 22 %
Lymphs Abs: 1.8 10*3/uL (ref 0.7–4.0)
MCH: 28.9 pg (ref 26.0–34.0)
MCHC: 32.3 g/dL (ref 30.0–36.0)
MCV: 89.7 fL (ref 80.0–100.0)
Monocytes Absolute: 0.6 10*3/uL (ref 0.1–1.0)
Monocytes Relative: 7 %
Neutro Abs: 5.5 10*3/uL (ref 1.7–7.7)
Neutrophils Relative %: 67 %
Platelets: 400 10*3/uL (ref 150–400)
RBC: 3.49 MIL/uL — ABNORMAL LOW (ref 3.87–5.11)
RDW: 12.6 % (ref 11.5–15.5)
WBC: 8.2 10*3/uL (ref 4.0–10.5)
nRBC: 0 % (ref 0.0–0.2)

## 2023-10-20 LAB — PHOSPHORUS: Phosphorus: 4.8 mg/dL — ABNORMAL HIGH (ref 2.5–4.6)

## 2023-10-20 LAB — COMPREHENSIVE METABOLIC PANEL
ALT: 11 U/L (ref 0–44)
AST: 19 U/L (ref 15–41)
Albumin: 2.2 g/dL — ABNORMAL LOW (ref 3.5–5.0)
Alkaline Phosphatase: 67 U/L (ref 38–126)
Anion gap: 10 (ref 5–15)
BUN: 8 mg/dL (ref 6–20)
CO2: 23 mmol/L (ref 22–32)
Calcium: 8.6 mg/dL — ABNORMAL LOW (ref 8.9–10.3)
Chloride: 103 mmol/L (ref 98–111)
Creatinine, Ser: 0.58 mg/dL (ref 0.44–1.00)
GFR, Estimated: >60 mL/min (ref >60)
Glucose, Bld: 88 mg/dL (ref 70–99)
Potassium: 4 mmol/L (ref 3.5–5.1)
Sodium: 136 mmol/L (ref 135–145)
Total Bilirubin: 0.3 mg/dL (ref 0.0–1.2)
Total Protein: 6.6 g/dL (ref 6.5–8.1)

## 2023-10-20 LAB — MAGNESIUM: Magnesium: 1.9 mg/dL (ref 1.7–2.4)

## 2023-10-20 MED ORDER — HYDROMORPHONE HCL 1 MG/ML IJ SOLN
1.0000 mg | INTRAMUSCULAR | Status: DC | PRN
  Administered 2023-10-20 – 2023-10-21 (×6): 1 mg via INTRAVENOUS
  Filled 2023-10-20 (×6): qty 1

## 2023-10-20 NOTE — Plan of Care (Signed)

## 2023-10-20 NOTE — Progress Notes (Signed)
 Spoke with Dorene Grebe at [facility] and provided report. Patient currently being transferred to [facility] via [facility].

## 2023-10-20 NOTE — Progress Notes (Addendum)
 PROGRESS NOTE    Pam Soto  WUJ:[id number] DOB: 10-11-1990 DOA: 10/14/2023 PCP: Mechele Claude, MD  No chief complaint on file.   Hospital Course:  Pam Soto is 33 y.o. female with history of IV drug abuse who presents with right-sided chest pain and shortness of breath.  Patient endorses muscle cramps over the past few days which she believes are secondary to depleted electrolytes.  She reports her pleuritic chest pain has become severe and thus presented to the hospital.  She reports she has been trying to abstain from illicit substances and has been taking methadone.  She was concerned that her musculoskeletal pain was a result of withdrawal and she injected fentanyl yesterday.  On arrival to the ED patient was febrile, saturating the mid 90s on room air, tachypneic, tachycardic.  Labs reveal hyponatremia to 129, hypokalemia to 2.9, hypomagnesemia 1.6, leukocytosis 12.4 K.  Respiratory viral panel was negative.  Chest x-ray concerning for multifocal pneumonia.  CTA concerning for multifocal infiltrates which may be septic emboli.  Blood cultures were collected, the patient was started on IV fluids.  Subjective: Reports chest pain is getting worse.  She reports minimal improvement with oxycodone.  Only receiving improvement with Toradol.   Objective: Vitals:   10/19/23 0444 10/19/23 1319 10/19/23 2001 10/20/23 0556  BP: 123/85 120/82 (!) 135/92 (!) 140/95  Pulse: 82 84 88 76  Resp: 20 20 16    Temp: 98.4 F (36.9 C) 98.2 F (36.8 C) 98.3 F (36.8 C) 99.1 F (37.3 C)  TempSrc: Oral Oral Oral Oral  SpO2: 94% 94% 95% 96%  Weight:      Height:        Intake/Output Summary (Last 24 hours) at 10/20/2023 0846 Last data filed at 10/20/2023 0657 Gross per 24 hour  Intake 1707.58 ml  Output --  Net 1707.58 ml   Filed Weights   10/14/23 2300 10/17/23 1151  Weight: 81.6 kg 81.6 kg    Examination: General exam: Appears calm and uncomfortable. Grimaces with minimal movement.  Crying, diaphoretic.  Respiratory system: tachypnea, crackles right lung.  Cardiovascular system: S1 & S2 heard, RRR.  Gastrointestinal system: Abdomen is nondistended, soft and nontender.  Neuro: Alert and oriented. No focal neurological deficits. Extremities: Symmetric, expected ROM Skin: No rashes, lesions Psychiatry: Mood & affect appropriate for situation.   Assessment & Plan:  Principal Problem:   Pneumonia Active Problems:   Hyponatremia   Hypokalemia   Hypomagnesemia   Prolonged QT interval   IV drug abuse (HCC)   Multifocal pneumonia   Gram-positive cocci bacteremia     Loculated right-sided pleural effusion - Presumed empyema - Transfer to [facility] for thoracentesis, likely chest tube.  PCCM consult on arrival.  Currently awaiting bed - In the interim have broadened antibiotics to Unasyn  Multifocal pneumonia MSSA Bacteremia - Multifocal infiltrates noted in right lower lobe on initial CTA.  Concerning for septic emboli.  CT reveals worsening pleural effusion. - 3/4 cultures positive for MSSA bacteremia. Repeat 3/6 cultures still negative, follow. - Sputum culture ordered, appears not collected - TTE without evidence of endocarditis, TEE also negative - Infectious disease consulted.  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 - Will need to continue on cefazolin for 4 weeks from 3/5 with end of treatment on 4/1 followed by dose of oritavancin/dalbavancin on 4/2 to complete the 6-week course. Currently on Unasyn  given effusion as  above. - Unfortunately given patient's active IV drug use she is not a candidate for PICC therapy and will need to remain in the supervised setting for the duration of her treatment. She is amendable to this.  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  Source: Pneumonia and bacteremia as above -- Abx as below  Normocytic Anemia - Iron studies ordered -- Hgb stable, trend -- Transfuse <7  Polysubstance Abuse IV drug abuse - Almost daily use of heroin/fentanyl, cocaine.  Occasional use of benzodiazepines though not consistently.  Denies any alcohol use - Patient has been working towards abstinence and is on methadone outpatient.  She did use IV fentanyl the day prior to arrival.  She desires continued abstinence and would like medication assisted withdrawal while admitted - Resume home dose methadone for now - [facility] consulted for resource assistance  Right side Pleuritis -- Severe and worsening -- Cont methadone, Toradol, gabapentin, tylenol, lidocaine patches.  -- PRN Oxy and Dilaudid.  Unfortunately pleuritic pain is becoming severe due to increasing effusion.  We will try to wean narcotics as soon as pain is under control.    Hypokalemia Hypomagnesemia - Replace as needed - Follow BMP  Prolonged QT - Continue to optimize electrolytes, avoid QT prolonging meds - Repeat EKG 3/5 shows Qtc improved to normal  Hyponatremia - Continue IV hydration with NS  AKI, POA - now resolved.  DVT prophylaxis: Lovenox   Code Status: Full Code Family Communication:  Discussed directly with patient  Disposition:  Inpatient, still hospitalized for IV abx, will need until 4/1, not a candidate for PICC therapy. Pending transfer to [facility] for thoracentesis, PCCM consult and possible chest tube. Will need to remain in house until 4/1.  Consultants:  Treatment Team:  Consulting Physician: Odette Fraction, MD Consulting Physician: Marjo Bicker, MD  Procedures:    Antimicrobials:  Anti-infectives (From admission, onward)    Start     Dose/Rate Route Frequency Ordered Stop   10/19/23 1700   Ampicillin-Sulbactam (UNASYN) 3 g in sodium chloride 0.9 % 100 mL IVPB        3 g 200 mL/hr over 30 Minutes Intravenous Every 6 hours 10/19/23 1609     10/16/23 0915  ceFAZolin (ANCEF) IVPB 2g/100 mL premix  Status:  Discontinued        2 g 200 mL/hr over 30 Minutes Intravenous Every 8 hours 10/16/23 0826 10/19/23 1610   10/15/23 1000  vancomycin (VANCOCIN) IVPB 1000 mg/200 mL premix  Status:  Discontinued        1,000 mg 200 mL/hr over 60 Minutes Intravenous Every 12 hours 10/15/23 0406 10/16/23 0826   10/15/23 0600  cefTRIAXone (ROCEPHIN) 2 g in sodium chloride 0.9 % 100 mL IVPB  Status:  Discontinued        2 g 200 mL/hr over 30 Minutes Intravenous Every 24 hours 10/15/23 0354 10/16/23 0826   10/15/23 0400  doxycycline (VIBRAMYCIN) 100 mg in sodium chloride 0.9 % 250 mL IVPB  Status:  Discontinued        100 mg 125 mL/hr over 120 Minutes Intravenous Every 12 hours 10/15/23 0354 10/16/23 0826   10/14/23 2315  ceFEPIme (MAXIPIME) 2 g in sodium chloride 0.9 % 100 mL IVPB        2 g 200  mL/hr over 30 Minutes Intravenous  Once 10/14/23 2303 10/15/23 0109   10/14/23 2315  Vancomycin (VANCOCIN) 1,500 mg in sodium chloride 0.9 % 500 mL IVPB        1,500 mg 250 mL/hr over 120 Minutes Intravenous  Once 10/14/23 2304 10/15/23 0228       Data Reviewed: I have personally reviewed following labs and imaging studies CBC: Recent Labs  Lab 10/16/23 0408 10/17/23 0316 10/18/23 0505 10/19/23 0612 10/20/23 0326  WBC 11.0* 8.4 8.7 8.1 8.2  NEUTROABS 7.5 5.1 5.0 4.3 5.5  HGB 9.7* 9.4* 10.8* 9.1* 10.1*  HCT 30.5* 29.7* 35.5* 27.8* 31.3*  MCV 90.8 91.4 93.9 89.4 89.7  PLT 182 215 310 326 400   Basic Metabolic Panel: Recent Labs  Lab 10/16/23 0408 10/17/23 0316 10/18/23 0505 10/19/23 0612 10/20/23 0326  NA 134* 137 139 138 136  K 3.7 3.5 3.5 3.6 4.0  CL 109 107 106 103 103  CO2 21* 24 26 25 23   GLUCOSE 114* 96 78 94 88  BUN 8 6 8 8 8   CREATININE 0.60 0.55 0.66 0.56 0.58  CALCIUM  7.9* 7.8* 8.3* 8.4* 8.6*  MG 1.9 1.7 2.0 1.9 1.9  PHOS 2.3* 3.3 4.5 4.7* 4.8*   GFR: Estimated Creatinine Clearance: 113 mL/min (by C-G formula based on SCr of 0.58 mg/dL). Liver Function Tests: Recent Labs  Lab 10/16/23 0408 10/17/23 0316 10/18/23 0505 10/19/23 0612 10/20/23 0326  AST 21 17 20 17 19   ALT 18 13 15 12 11   ALKPHOS 62 58 83 68 67  BILITOT 0.4 <0.2 0.2 <0.2 0.3  PROT 5.9* 5.6* 6.9 6.4* 6.6  ALBUMIN 2.5* 2.2* 2.6* 2.2* 2.2*   CBG: No results for input(s): "GLUCAP" in the last 168 hours.  Recent Results (from the past 240 hours)  Resp panel by RT-PCR (RSV, Flu A&B, Covid) Anterior Nasal Swab     Status: None   Collection Time: 10/14/23  8:56 PM   Specimen: Anterior Nasal Swab  Result Value Ref Range Status   SARS Coronavirus 2 by RT PCR NEGATIVE NEGATIVE Final    Comment: (NOTE) SARS-CoV-2 target nucleic acids are NOT DETECTED.  The SARS-CoV-2 RNA is generally detectable in upper respiratory specimens during the acute phase of infection. The lowest concentration of SARS-CoV-2 viral copies this assay can detect is 138 copies/mL. A negative result does not preclude SARS-Cov-2 infection and should not be used as the sole basis for treatment or other patient management decisions. A negative result may occur with  improper specimen collection/handling, submission of specimen other than nasopharyngeal swab, presence of viral mutation(s) within the areas targeted by this assay, and inadequate number of viral copies(<138 copies/mL). A negative result must be combined with clinical observations, patient history, and epidemiological information. The expected result is Negative.  Fact Sheet for Patients:  BloggerCourse.com  Fact Sheet for Healthcare Providers:  SeriousBroker.it  This test is no t yet approved or cleared by the [place] FDA and  has been authorized for detection and/or diagnosis of SARS-CoV-2  by FDA under an Emergency Use Authorization (EUA). This EUA will remain  in effect (meaning this test can be used) for the duration of the COVID-19 declaration under Section 564(b)(1) of the Act, 21 U.S.C.section 360bbb-3(b)(1), unless the authorization is terminated  or revoked sooner.       Influenza A by PCR NEGATIVE NEGATIVE Final   Influenza B by PCR NEGATIVE NEGATIVE Final    Comment: (NOTE) The Xpert Xpress SARS-CoV-2/FLU/RSV plus  assay is intended as an aid in the diagnosis of influenza from Nasopharyngeal swab specimens and should not be used as a sole basis for treatment. Nasal washings and aspirates are unacceptable for Xpert Xpress SARS-CoV-2/FLU/RSV testing.  Fact Sheet for Patients: BloggerCourse.com  Fact Sheet for Healthcare Providers: SeriousBroker.it  This test is not yet approved or cleared by the [place] FDA and has been authorized for detection and/or diagnosis of SARS-CoV-2 by FDA under an Emergency Use Authorization (EUA). This EUA will remain in effect (meaning this test can be used) for the duration of the COVID-19 declaration under Section 564(b)(1) of the Act, 21 U.S.C. section 360bbb-3(b)(1), unless the authorization is terminated or revoked.     Resp Syncytial Virus by PCR NEGATIVE NEGATIVE Final    Comment: (NOTE) Fact Sheet for Patients: BloggerCourse.com  Fact Sheet for Healthcare Providers: SeriousBroker.it  This test is not yet approved or cleared by the [place] FDA and has been authorized for detection and/or diagnosis of SARS-CoV-2 by FDA under an Emergency Use Authorization (EUA). This EUA will remain in effect (meaning this test can be used) for the duration of the COVID-19 declaration under Section 564(b)(1) of the Act, 21 U.S.C. section 360bbb-3(b)(1), unless the authorization is terminated or revoked.  Performed at  [facility], [address] [place], [place] [address]   Blood culture (routine x 2)     Status: Abnormal   Collection Time: 10/14/23 11:03 PM   Specimen: BLOOD  Result Value Ref Range Status   Specimen Description   Final    BLOOD BLOOD RIGHT ARM Performed at [facility], [address] [place], [place] [address]    Special Requests   Final    BOTTLES DRAWN AEROBIC AND ANAEROBIC Blood Culture adequate volume Performed at [facility], [address], [place], [place] [address]    Culture  Setup Time   Final    GRAM POSITIVE COCCI ANAEROBIC BOTTLE ONLY Gram Stain Report Called to,Read Back By and Verified With: Wilburt Finlay, RN AT 1123 10/15/23 BY A. SNYDER AEROBIC BOTTLE ONLY GRAM POSITIVE COCCI GRAM STAIN REVIEWED-AGREE WITH RESULT CRITICAL RESULT CALLED TO, READ BACK BY AND VERIFIED WITH: RN RENEE TEJEDA ON 10/15/23 @ 1733 BY DRT  Performed at [facility], [address] [place], [place] [address]    Culture STAPHYLOCOCCUS AUREUS (A)  Final   Report Status 10/17/2023 FINAL  Final   Organism ID, Bacteria STAPHYLOCOCCUS AUREUS  Final      Susceptibility   Staphylococcus aureus - MIC*    CIPROFLOXACIN >=8 RESISTANT Resistant     ERYTHROMYCIN >=8 RESISTANT Resistant     GENTAMICIN <=0.5 SENSITIVE Sensitive     OXACILLIN 0.5 SENSITIVE Sensitive     TETRACYCLINE <=1 SENSITIVE Sensitive     VANCOMYCIN <=0.5 SENSITIVE Sensitive     TRIMETH/SULFA >=320 RESISTANT Resistant     CLINDAMYCIN <=0.25 SENSITIVE Sensitive     RIFAMPIN <=0.5 SENSITIVE Sensitive     Inducible Clindamycin NEGATIVE Sensitive     LINEZOLID 2 SENSITIVE Sensitive     * STAPHYLOCOCCUS AUREUS  Blood Culture ID Panel (Reflexed)     Status: Abnormal   Collection Time: 10/14/23 11:03 PM  Result Value Ref Range Status   Enterococcus faecalis NOT DETECTED NOT DETECTED Final   Enterococcus Faecium NOT DETECTED NOT DETECTED Final   Listeria monocytogenes NOT DETECTED NOT DETECTED Final   Staphylococcus species  DETECTED (A) NOT DETECTED Final    Comment: CRITICAL RESULT CALLED TO, READ BACK  BY AND VERIFIED WITH: RN RENEE TEJEDA ON 10/15/23 @ 1733 BY DRT     Staphylococcus aureus (BCID) DETECTED (A) NOT DETECTED Final    Comment: CRITICAL RESULT CALLED TO, READ BACK BY AND VERIFIED WITH: RN RENEE TEJEDA ON 10/15/23 @ 1733 BY DRT     Staphylococcus epidermidis NOT DETECTED NOT DETECTED Final   Staphylococcus lugdunensis NOT DETECTED NOT DETECTED Final   Streptococcus species NOT DETECTED NOT DETECTED Final   Streptococcus agalactiae NOT DETECTED NOT DETECTED Final   Streptococcus pneumoniae NOT DETECTED NOT DETECTED Final   Streptococcus pyogenes NOT DETECTED NOT DETECTED Final   A.calcoaceticus-baumannii NOT DETECTED NOT DETECTED Final   Bacteroides fragilis NOT DETECTED NOT DETECTED Final   Enterobacterales NOT DETECTED NOT DETECTED Final   Enterobacter cloacae complex NOT DETECTED NOT DETECTED Final   Escherichia coli NOT DETECTED NOT DETECTED Final   Klebsiella aerogenes NOT DETECTED NOT DETECTED Final   Klebsiella oxytoca NOT DETECTED NOT DETECTED Final   Klebsiella pneumoniae NOT DETECTED NOT DETECTED Final   Proteus species NOT DETECTED NOT DETECTED Final   Salmonella species NOT DETECTED NOT DETECTED Final   Serratia marcescens NOT DETECTED NOT DETECTED Final   Haemophilus influenzae NOT DETECTED NOT DETECTED Final   Neisseria meningitidis NOT DETECTED NOT DETECTED Final   Pseudomonas aeruginosa NOT DETECTED NOT DETECTED Final   Stenotrophomonas maltophilia NOT DETECTED NOT DETECTED Final   Candida albicans NOT DETECTED NOT DETECTED Final   Candida auris NOT DETECTED NOT DETECTED Final   Candida glabrata NOT DETECTED NOT DETECTED Final   Candida krusei NOT DETECTED NOT DETECTED Final   Candida parapsilosis NOT DETECTED NOT DETECTED Final   Candida tropicalis NOT DETECTED NOT DETECTED Final   Cryptococcus neoformans/gattii NOT DETECTED NOT DETECTED Final   Meth resistant mecA/C and MREJ  NOT DETECTED NOT DETECTED Final    Comment: Performed at [facility], [address], [place], [place] [address]  Blood culture (routine x 2)     Status: Abnormal   Collection Time: 10/14/23 11:06 PM   Specimen: BLOOD  Result Value Ref Range Status   Specimen Description   Final    BLOOD BLOOD RIGHT ARM Performed at [facility], [address], [place], [place] [address]    Special Requests   Final    BOTTLES DRAWN AEROBIC AND ANAEROBIC Blood Culture adequate volume Performed at [facility], [address] [place], [place] [address]    Culture  Setup Time   Final    GRAM POSITIVE COCCI IN BOTH AEROBIC AND ANAEROBIC BOTTLES Gram Stain Report Called to,Read Back By and Verified With: Wilburt Finlay, RN AT 1123 10/15/23 BY A. SNYDER    Culture (A)  Final    STAPHYLOCOCCUS AUREUS SUSCEPTIBILITIES PERFORMED ON PREVIOUS CULTURE WITHIN THE LAST 5 DAYS. Performed at [facility], [address] [place], [place] [address]    Report Status 10/17/2023 FINAL  Final  Culture, blood (single) w Reflex to ID Panel     Status: None   Collection Time: 10/15/23  5:41 AM   Specimen: BLOOD  Result Value Ref Range Status   Specimen Description BLOOD BLOOD RIGHT HAND  Final   Special Requests   Final    BOTTLES DRAWN AEROBIC ONLY Blood Culture adequate volume   Culture   Final    NO GROWTH 5 DAYS Performed at [facility], [address], [place], [place] [address]    Report Status 10/20/2023 FINAL  Final  Culture, blood (Routine X  2) w Reflex to ID Panel     Status: None (Preliminary result)   Collection Time: 10/16/23  4:08 AM   Specimen: BLOOD  Result Value Ref Range Status   Specimen Description BLOOD BLOOD LEFT HAND  Final   Special Requests   Final    BOTTLES DRAWN AEROBIC AND ANAEROBIC Blood Culture adequate volume   Culture   Final    NO GROWTH 4 DAYS Performed at [facility], [address], [place], [place] [address]    Report Status PENDING  Incomplete  Culture, blood  (Routine X 2) w Reflex to ID Panel     Status: None (Preliminary result)   Collection Time: 10/16/23  4:08 AM   Specimen: BLOOD  Result Value Ref Range Status   Specimen Description BLOOD BLOOD LEFT WRIST  Final   Special Requests   Final    BOTTLES DRAWN AEROBIC AND ANAEROBIC Blood Culture adequate volume   Culture   Final    NO GROWTH 4 DAYS Performed at [facility], [address] [place], [place] [address]    Report Status PENDING  Incomplete  MRSA Next Gen by PCR, Nasal     Status: None   Collection Time: 10/19/23  4:03 PM   Specimen: Nasal Mucosa; Nasal Swab  Result Value Ref Range Status   MRSA by PCR Next Gen NOT DETECTED NOT DETECTED Final    Comment: (NOTE) The GeneXpert MRSA Assay (FDA approved for NASAL specimens only), is one component of a comprehensive MRSA colonization surveillance program. It is not intended to diagnose MRSA infection nor to guide or monitor treatment for MRSA infections. Test performance is not FDA approved in patients less than 77 years old. Performed at [facility], [address] [place], [place] [address]      Radiology Studies: CT CHEST W CONTRAST Result Date: 10/19/2023 CLINICAL DATA:  Pneumonia, septic emboli, worsening pleuritic pain, persistent fever. EXAM: CT CHEST WITH CONTRAST TECHNIQUE: Multidetector CT imaging of the chest was performed during intravenous contrast administration. RADIATION DOSE REDUCTION: This exam was performed according to the departmental dose-optimization program which includes automated exposure control, adjustment of the mA and/or kV according to patient size and/or use of iterative reconstruction technique. CONTRAST:  75mL OMNIPAQUE IOHEXOL 300 MG/ML  SOLN COMPARISON:  10/14/2023 chest CT angiogram. FINDINGS: Cardiovascular: Normal heart size. No significant pericardial effusion/thickening. Great vessels are normal in course and caliber. No central pulmonary emboli. Mediastinum/Nodes: No significant thyroid  nodules. Unremarkable esophagus. No pathologically enlarged axillary, mediastinal or hilar lymph nodes. Lungs/Pleura: No pneumothorax. Loculated small to moderate anterior, posterior and major fissural right pleural effusion, new from prior chest CT. No left pleural effusion. Near complete right lower lobe lung consolidation with some volume loss, substantially worsened. New moderate right middle lobe consolidation with volume loss. Subsegmental atelectasis in the posterior upper lobes bilaterally and in the left lower lobe. Upper abdomen: No acute abnormality. Musculoskeletal:  No aggressive appearing focal osseous lesions. IMPRESSION: 1. Loculated small to moderate anterior, posterior and major fissural right pleural effusion, new from prior chest CT. 2. Near complete right lower lobe lung consolidation with some volume loss, substantially worsened. New moderate right middle lobe consolidation with volume loss. Findings are compatible with worsened multilobar pneumonia. 3. Subsegmental atelectasis in the posterior upper lobes bilaterally and in the left lower lobe. Electronically Signed   By: Delbert Phenix M.D.   On: 10/19/2023 13:32    Scheduled Meds:  acetaminophen  1,000 mg Oral Q6H   enoxaparin (  LOVENOX) injection  40 mg Subcutaneous Q24H   gabapentin  300 mg Oral TID   ketorolac  30 mg Intravenous Q8H   lidocaine  1 patch Transdermal Q24H   methadone  40 mg Oral Daily   nicotine  21 mg Transdermal Daily   pantoprazole  40 mg Oral Daily   sodium chloride flush  3 mL Intravenous Q12H   Continuous Infusions:  ampicillin-sulbactam (UNASYN) IV 3 g (10/20/23 0553)     LOS: 5 days    Total time spent interpreting labs and vitals, coordinating care amongst consultants and care team members, directly assessing and discussing care with the patient and/or family: 55 min   Debarah Crape, DO [facility]  To contact the attending physician between 7A-7P please use Epic Chat. To contact  the covering physician during after hours 7P-7A, please review Amion.   10/20/2023, 8:46 AM   *This document has been created with the assistance of dictation software. Please excuse typographical errors. *

## 2023-10-20 NOTE — Anesthesia Postprocedure Evaluation (Signed)
 Anesthesia Post Note  Patient: Pam Soto  Procedure(s) Performed: ECHOCARDIOGRAM, TRANSESOPHAGEAL  Patient location during evaluation: Phase II Anesthesia Type: General Level of consciousness: awake Pain management: pain level controlled Vital Signs Assessment: post-procedure vital signs reviewed and stable Respiratory status: spontaneous breathing and respiratory function stable Cardiovascular status: blood pressure returned to baseline and stable Postop Assessment: no headache and no apparent nausea or vomiting Anesthetic complications: no Comments: Late entry   No notable events documented.   Last Vitals:  Vitals:   10/19/23 2001 10/20/23 0556  BP: (!) 135/92 (!) 140/95  Pulse: 88 76  Resp: 16   Temp: 36.8 C 37.3 C  SpO2: 95% 96%    Last Pain:  Vitals:   10/20/23 1341  TempSrc:   PainSc: 7                  Windell Norfolk

## 2023-10-21 ENCOUNTER — Encounter (HOSPITAL_COMMUNITY)
Admission: EM | Disposition: A | Discharge status: Home / Self Care | Payer: Self-pay | Source: Home / Self Care | Attending: Family Medicine

## 2023-10-21 ENCOUNTER — Inpatient Hospital Stay (HOSPITAL_COMMUNITY): Payer: MEDICAID

## 2023-10-21 DIAGNOSIS — R9431 Abnormal electrocardiogram [ECG] [EKG]: Secondary | ICD-10-CM | POA: Diagnosis not present

## 2023-10-21 DIAGNOSIS — J90 Pleural effusion, not elsewhere classified: Secondary | ICD-10-CM

## 2023-10-21 DIAGNOSIS — J15211 Pneumonia due to Methicillin susceptible Staphylococcus aureus: Secondary | ICD-10-CM | POA: Diagnosis not present

## 2023-10-21 DIAGNOSIS — E876 Hypokalemia: Secondary | ICD-10-CM | POA: Diagnosis not present

## 2023-10-21 DIAGNOSIS — F1910 Other psychoactive substance abuse, uncomplicated: Secondary | ICD-10-CM | POA: Diagnosis not present

## 2023-10-21 LAB — CBC WITH DIFFERENTIAL/PLATELET
Abs Immature Granulocytes: 0.08 10*3/uL — ABNORMAL HIGH (ref 0.00–0.07)
Basophils Absolute: 0 10*3/uL (ref 0.0–0.1)
Basophils Relative: 0 %
Eosinophils Absolute: 0.2 10*3/uL (ref 0.0–0.5)
Eosinophils Relative: 3 %
HCT: 28.7 % — ABNORMAL LOW (ref 36.0–46.0)
Hemoglobin: 9.6 g/dL — ABNORMAL LOW (ref 12.0–15.0)
Immature Granulocytes: 1 %
Lymphocytes Relative: 32 %
Lymphs Abs: 2.5 10*3/uL (ref 0.7–4.0)
MCH: 29.3 pg (ref 26.0–34.0)
MCHC: 33.4 g/dL (ref 30.0–36.0)
MCV: 87.5 fL (ref 80.0–100.0)
Monocytes Absolute: 0.6 10*3/uL (ref 0.1–1.0)
Monocytes Relative: 8 %
Neutro Abs: 4.3 10*3/uL (ref 1.7–7.7)
Neutrophils Relative %: 56 %
Platelets: 420 10*3/uL — ABNORMAL HIGH (ref 150–400)
RBC: 3.28 MIL/uL — ABNORMAL LOW (ref 3.87–5.11)
RDW: 12.8 % (ref 11.5–15.5)
WBC: 7.7 10*3/uL (ref 4.0–10.5)
nRBC: 0 % (ref 0.0–0.2)

## 2023-10-21 LAB — COMPREHENSIVE METABOLIC PANEL
ALT: 10 U/L (ref 0–44)
AST: 15 U/L (ref 15–41)
Albumin: 1.9 g/dL — ABNORMAL LOW (ref 3.5–5.0)
Alkaline Phosphatase: 61 U/L (ref 38–126)
Anion gap: 7 (ref 5–15)
BUN: 11 mg/dL (ref 6–20)
CO2: 24 mmol/L (ref 22–32)
Calcium: 8.3 mg/dL — ABNORMAL LOW (ref 8.9–10.3)
Chloride: 106 mmol/L (ref 98–111)
Creatinine, Ser: 0.62 mg/dL (ref 0.44–1.00)
GFR, Estimated: >60 mL/min (ref >60)
Glucose, Bld: 95 mg/dL (ref 70–99)
Potassium: 4 mmol/L (ref 3.5–5.1)
Sodium: 137 mmol/L (ref 135–145)
Total Bilirubin: 0.6 mg/dL (ref 0.0–1.2)
Total Protein: 6.1 g/dL — ABNORMAL LOW (ref 6.5–8.1)

## 2023-10-21 LAB — BODY FLUID CELL COUNT WITH DIFFERENTIAL
Eos, Fluid: 0 %
Lymphs, Fluid: 46 %
Monocyte-Macrophage-Serous Fluid: 4 % — ABNORMAL LOW (ref 50–90)
Neutrophil Count, Fluid: 50 % — ABNORMAL HIGH (ref 0–25)
Total Nucleated Cell Count, Fluid: 3320 uL — ABNORMAL HIGH (ref 0–1000)

## 2023-10-21 LAB — PROTEIN, PLEURAL OR PERITONEAL FLUID: Total protein, fluid: 3.7 g/dL

## 2023-10-21 LAB — CULTURE, BLOOD (ROUTINE X 2)
Culture: NO GROWTH
Culture: NO GROWTH
Special Requests: ADEQUATE
Special Requests: ADEQUATE

## 2023-10-21 LAB — AMYLASE, PLEURAL OR PERITONEAL FLUID: Amylase, Fluid: 45 U/L

## 2023-10-21 LAB — MAGNESIUM: Magnesium: 2 mg/dL (ref 1.7–2.4)

## 2023-10-21 LAB — GLUCOSE, PLEURAL OR PERITONEAL FLUID: Glucose, Fluid: 63 mg/dL

## 2023-10-21 LAB — LACTATE DEHYDROGENASE, PLEURAL OR PERITONEAL FLUID: LD, Fluid: 390 U/L — ABNORMAL HIGH (ref 3–23)

## 2023-10-21 LAB — ALBUMIN, PLEURAL OR PERITONEAL FLUID: Albumin, Fluid: <1.5 g/dL

## 2023-10-21 SURGERY — CHEST TUBE INSERTION
Anesthesia: LOCAL

## 2023-10-21 MED ORDER — LIDOCAINE HCL 1 % IJ SOLN
INTRAMUSCULAR | Status: AC
  Filled 2023-10-21: qty 20

## 2023-10-21 MED ORDER — HYDROMORPHONE HCL 1 MG/ML IJ SOLN: 0.5000 mg | INTRAMUSCULAR | Status: DC | PRN

## 2023-10-21 MED ORDER — OXYCODONE HCL 5 MG PO TABS
5.0000 mg | ORAL_TABLET | ORAL | Status: DC | PRN
Start: 2023-10-21 — End: 2023-10-30
  Administered 2023-10-21 – 2023-10-26 (×23): 10 mg via ORAL
  Administered 2023-10-26: 5 mg via ORAL
  Administered 2023-10-26 – 2023-10-28 (×6): 10 mg via ORAL
  Administered 2023-10-28: 5 mg via ORAL
  Administered 2023-10-28 – 2023-10-29 (×6): 10 mg via ORAL
  Administered 2023-10-29: 5 mg via ORAL
  Filled 2023-10-21 (×39): qty 2
  Filled 2023-10-21: qty 1

## 2023-10-21 MED ORDER — OXYCODONE HCL 5 MG PO TABS
5.0000 mg | ORAL_TABLET | ORAL | Status: DC | PRN
  Administered 2023-10-21: 5 mg via ORAL
  Filled 2023-10-21: qty 1

## 2023-10-21 MED ORDER — LIDOCAINE HCL 1 % IJ SOLN
20.0000 mL | Freq: Once | INTRAMUSCULAR | Status: AC
  Administered 2023-10-21: 10 mL

## 2023-10-21 NOTE — Plan of Care (Signed)

## 2023-10-21 NOTE — Progress Notes (Signed)
 PROGRESS NOTE    Pam Soto  VWU:[id number] DOB: 1991-05-20 DOA: 10/14/2023 PCP: Mechele Claude, MD  No chief complaint on file.   Hospital Course:  Pam Soto is 33 y.o. female with history of IV drug abuse who presents with right-sided chest pain and shortness of breath.  Patient endorses muscle cramps over the past few days which she believes are secondary to depleted electrolytes.  She reports her pleuritic chest pain has become severe and thus presented to the hospital.  She reports she has been trying to abstain from illicit substances and has been taking methadone.  She was concerned that her musculoskeletal pain was a result of withdrawal and she injected fentanyl yesterday.  On arrival to the ED patient was febrile, saturating the mid 90s on room air, tachypneic, tachycardic.  Labs reveal hyponatremia to 129, hypokalemia to 2.9, hypomagnesemia 1.6, leukocytosis 12.4 K.  Respiratory viral panel was negative.  Chest x-ray concerning for multifocal pneumonia.  CTA concerning for multifocal infiltrates which may be septic emboli.  Blood cultures were collected, the patient was started on IV fluids.  Subjective: Evaluated after thoracentesis today.  She reports she is already feeling much better.  Objective: Vitals:   10/20/23 1830 10/20/23 1937 10/21/23 0435 10/21/23 0721  BP: 131/87 122/81 (!) 132/92 (!) 132/93  Pulse: 89 81 81 77  Resp: 15 17 16    Temp: 98.2 F (36.8 C) 99.1 F (37.3 C) 99.1 F (37.3 C) 99.1 F (37.3 C)  TempSrc: Oral Oral Oral Oral  SpO2: 96% 96% 96% 98%  Weight:      Height:        Intake/Output Summary (Last 24 hours) at 10/21/2023 0836 Last data filed at 10/20/2023 2300 Gross per 24 hour  Intake 777 ml  Output 0 ml  Net 777 ml   Filed Weights   10/14/23 2300 10/17/23 1151  Weight: 81.6 kg 81.6 kg    Examination: General exam: Appears calm and uncomfortable. Grimaces with minimal movement. Crying, diaphoretic.  Respiratory system: tachypnea,  crackles right lung.  Cardiovascular system: S1 & S2 heard, RRR.  Gastrointestinal system: Abdomen is nondistended, soft and nontender.  Neuro: Alert and oriented. No focal neurological deficits. Extremities: Symmetric, expected ROM Skin: No rashes, lesions Psychiatry: Mood & affect appropriate for situation.   Assessment & Plan:  Principal Problem:   Pneumonia Active Problems:   Hyponatremia   Hypokalemia   Hypomagnesemia   Prolonged QT interval   IV drug abuse (HCC)   Multifocal pneumonia   Gram-positive cocci bacteremia     Loculated right-sided pleural effusion - Thoracentesis 3/11 with IR.  300 cc amber-colored fluid extracted. - Follow cultures, cont unasyn - Concern for empyema with possible need for chest tube in the future if this does not resolve PCCM was consulted - Appreciate pulmonary recommendations.  Multifocal pneumonia MSSA Bacteremia - Multifocal infiltrates noted in right lower lobe on initial CTA.  Concerning for septic emboli.  CT reveals worsening pleural effusion. - 3/4 cultures positive for MSSA bacteremia. Repeat 3/6 cultures negative - 3/7 TTE without evidence of endocarditis, TEE also negative - Infectious disease consulted.  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 - Will need to continue on cefazolin for 4 weeks from 3/5 with end of treatment on 4/1 followed by dose of oritavancin/dalbavancin on 4/2 to complete the 6-week course. Currently on Unasyn  given effusion as above. - Unfortunately given  patient's active IV drug use she is not a candidate for PICC therapy and will need to remain in the supervised setting for the duration of her treatment. She is amendable to this.  -- Encouraged IS and FV - HIV negative  HepB non immune -- Consider vaccination when out of acute illness   Hepatitis C infection -- Quant 199,000 -- Will need  outpatient treatment  Sepsis -Criteria met on arrival leukocytosis, tachycardia, tachypnea.  Source: Pneumonia and bacteremia as above -- Abx as below  Normocytic Anemia - Iron studies ordered -- Hgb stable, trend -- Transfuse <7  Polysubstance Abuse IV drug abuse - Almost daily use of heroin/fentanyl, cocaine.  Occasional use of benzodiazepines though not consistently.  Denies any alcohol use - Patient has been working towards abstinence and is on methadone outpatient.  She did use IV fentanyl the day prior to arrival.  She desires continued abstinence and would like medication assisted withdrawal while admitted - Resume home dose methadone for now - [facility] consulted for resource assistance  Right side Pleuritis -- Severe and worsening -- Cont methadone, Toradol, gabapentin, tylenol, lidocaine patches.  -- PRN Oxy   Hypokalemia Hypomagnesemia - Replace as needed - Follow BMP  Prolonged QT - Continue to optimize electrolytes, avoid QT prolonging meds - Repeat EKG 3/5 shows Qtc improved to normal  Hyponatremia, resolved  AKI, POA - now resolved.  DVT prophylaxis: Lovenox   Code Status: Full Code Family Communication:  Discussed directly with patient  Disposition:  Inpatient, still hospitalized for IV abx, will need until 4/1, not a candidate for PICC therapy. PCCM consult and possible chest tube. Will need to remain in house until 4/1.  Consultants:  Treatment Team:  Consulting Physician: Odette Fraction, MD Interventional radiology PCCM  Infectious Disease   Procedures:    Antimicrobials:  Anti-infectives (From admission, onward)    Start     Dose/Rate Route Frequency Ordered Stop   10/19/23 1700  Ampicillin-Sulbactam (UNASYN) 3 g in sodium chloride 0.9 % 100 mL IVPB        3 g 200 mL/hr over 30 Minutes Intravenous Every 6 hours 10/19/23 1609     10/16/23 0915  ceFAZolin (ANCEF) IVPB 2g/100 mL premix  Status:  Discontinued        2 g 200 mL/hr over 30  Minutes Intravenous Every 8 hours 10/16/23 0826 10/19/23 1610   10/15/23 1000  vancomycin (VANCOCIN) IVPB 1000 mg/200 mL premix  Status:  Discontinued        1,000 mg 200 mL/hr over 60 Minutes Intravenous Every 12 hours 10/15/23 0406 10/16/23 0826   10/15/23 0600  cefTRIAXone (ROCEPHIN) 2 g in sodium chloride 0.9 % 100 mL IVPB  Status:  Discontinued        2 g 200 mL/hr over 30 Minutes Intravenous Every 24 hours 10/15/23 0354 10/16/23 0826   10/15/23 0400  doxycycline (VIBRAMYCIN) 100 mg in sodium chloride 0.9 % 250 mL IVPB  Status:  Discontinued        100 mg 125 mL/hr over 120 Minutes Intravenous Every 12 hours 10/15/23 0354 10/16/23 0826   10/14/23 2315  ceFEPIme (MAXIPIME) 2 g in sodium chloride 0.9 % 100 mL IVPB        2 g 200 mL/hr over 30 Minutes Intravenous  Once 10/14/23 2303 10/15/23 0109   10/14/23 2315  Vancomycin (VANCOCIN) 1,500 mg in sodium chloride 0.9 % 500 mL IVPB        1,500 mg 250 mL/hr over 120 Minutes  Intravenous  Once 10/14/23 2304 10/15/23 0228       Data Reviewed: I have personally reviewed following labs and imaging studies CBC: Recent Labs  Lab 10/17/23 0316 10/18/23 0505 10/19/23 0612 10/20/23 0326 10/20/23 1855 10/21/23 0626  WBC 8.4 8.7 8.1 8.2 9.8 7.7  NEUTROABS 5.1 5.0 4.3 5.5  --  4.3  HGB 9.4* 10.8* 9.1* 10.1* 10.4* 9.6*  HCT 29.7* 35.5* 27.8* 31.3* 31.7* 28.7*  MCV 91.4 93.9 89.4 89.7 88.3 87.5  PLT 215 310 326 400 417* 420*   Basic Metabolic Panel: Recent Labs  Lab 10/16/23 0408 10/17/23 0316 10/18/23 0505 10/19/23 0612 10/20/23 0326 10/21/23 0626  NA 134* 137 139 138 136 137  K 3.7 3.5 3.5 3.6 4.0 4.0  CL 109 107 106 103 103 106  CO2 21* 24 26 25 23 24   GLUCOSE 114* 96 78 94 88 95  BUN 8 6 8 8 8 11   CREATININE 0.60 0.55 0.66 0.56 0.58 0.62  CALCIUM 7.9* 7.8* 8.3* 8.4* 8.6* 8.3*  MG 1.9 1.7 2.0 1.9 1.9 2.0  PHOS 2.3* 3.3 4.5 4.7* 4.8*  --    GFR: Estimated Creatinine Clearance: 113 mL/min (by C-G formula based on SCr of  0.62 mg/dL). Liver Function Tests: Recent Labs  Lab 10/17/23 0316 10/18/23 0505 10/19/23 0612 10/20/23 0326 10/21/23 0626  AST 17 20 17 19 15   ALT 13 15 12 11 10   ALKPHOS 58 83 68 67 61  BILITOT <0.2 0.2 <0.2 0.3 0.6  PROT 5.6* 6.9 6.4* 6.6 6.1*  ALBUMIN 2.2* 2.6* 2.2* 2.2* 1.9*   CBG: No results for input(s): "GLUCAP" in the last 168 hours.  Recent Results (from the past 240 hours)  Resp panel by RT-PCR (RSV, Flu A&B, Covid) Anterior Nasal Swab     Status: None   Collection Time: 10/14/23  8:56 PM   Specimen: Anterior Nasal Swab  Result Value Ref Range Status   SARS Coronavirus 2 by RT PCR NEGATIVE NEGATIVE Final    Comment: (NOTE) SARS-CoV-2 target nucleic acids are NOT DETECTED.  The SARS-CoV-2 RNA is generally detectable in upper respiratory specimens during the acute phase of infection. The lowest concentration of SARS-CoV-2 viral copies this assay can detect is 138 copies/mL. A negative result does not preclude SARS-Cov-2 infection and should not be used as the sole basis for treatment or other patient management decisions. A negative result may occur with  improper specimen collection/handling, submission of specimen other than nasopharyngeal swab, presence of viral mutation(s) within the areas targeted by this assay, and inadequate number of viral copies(<138 copies/mL). A negative result must be combined with clinical observations, patient history, and epidemiological information. The expected result is Negative.  Fact Sheet for Patients:  BloggerCourse.com  Fact Sheet for Healthcare Providers:  SeriousBroker.it  This test is no t yet approved or cleared by the [place] FDA and  has been authorized for detection and/or diagnosis of SARS-CoV-2 by FDA under an Emergency Use Authorization (EUA). This EUA will remain  in effect (meaning this test can be used) for the duration of the COVID-19 declaration  under Section 564(b)(1) of the Act, 21 U.S.C.section 360bbb-3(b)(1), unless the authorization is terminated  or revoked sooner.       Influenza A by PCR NEGATIVE NEGATIVE Final   Influenza B by PCR NEGATIVE NEGATIVE Final    Comment: (NOTE) The Xpert Xpress SARS-CoV-2/FLU/RSV plus assay is intended as an aid in the diagnosis of influenza from Nasopharyngeal swab specimens and should not  be used as a sole basis for treatment. Nasal washings and aspirates are unacceptable for Xpert Xpress SARS-CoV-2/FLU/RSV testing.  Fact Sheet for Patients: BloggerCourse.com  Fact Sheet for Healthcare Providers: SeriousBroker.it  This test is not yet approved or cleared by the [place] FDA and has been authorized for detection and/or diagnosis of SARS-CoV-2 by FDA under an Emergency Use Authorization (EUA). This EUA will remain in effect (meaning this test can be used) for the duration of the COVID-19 declaration under Section 564(b)(1) of the Act, 21 U.S.C. section 360bbb-3(b)(1), unless the authorization is terminated or revoked.     Resp Syncytial Virus by PCR NEGATIVE NEGATIVE Final    Comment: (NOTE) Fact Sheet for Patients: BloggerCourse.com  Fact Sheet for Healthcare Providers: SeriousBroker.it  This test is not yet approved or cleared by the [place] FDA and has been authorized for detection and/or diagnosis of SARS-CoV-2 by FDA under an Emergency Use Authorization (EUA). This EUA will remain in effect (meaning this test can be used) for the duration of the COVID-19 declaration under Section 564(b)(1) of the Act, 21 U.S.C. section 360bbb-3(b)(1), unless the authorization is terminated or revoked.  Performed at [facility], [address], [place], [place] [address]   Blood culture (routine x 2)     Status: Abnormal   Collection Time: 10/14/23 11:03 PM   Specimen: BLOOD   Result Value Ref Range Status   Specimen Description   Final    BLOOD BLOOD RIGHT ARM Performed at [facility], [address], [place], [place] [address]    Special Requests   Final    BOTTLES DRAWN AEROBIC AND ANAEROBIC Blood Culture adequate volume Performed at [facility], [address] [place], [place] [address]    Culture  Setup Time   Final    GRAM POSITIVE COCCI ANAEROBIC BOTTLE ONLY Gram Stain Report Called to,Read Back By and Verified With: Wilburt Finlay, RN AT 1123 10/15/23 BY A. SNYDER AEROBIC BOTTLE ONLY GRAM POSITIVE COCCI GRAM STAIN REVIEWED-AGREE WITH RESULT CRITICAL RESULT CALLED TO, READ BACK BY AND VERIFIED WITH: RN RENEE TEJEDA ON 10/15/23 @ 1733 BY DRT  Performed at [facility], [address], [place], [place] [address]    Culture STAPHYLOCOCCUS AUREUS (A)  Final   Report Status 10/17/2023 FINAL  Final   Organism ID, Bacteria STAPHYLOCOCCUS AUREUS  Final      Susceptibility   Staphylococcus aureus - MIC*    CIPROFLOXACIN >=8 RESISTANT Resistant     ERYTHROMYCIN >=8 RESISTANT Resistant     GENTAMICIN <=0.5 SENSITIVE Sensitive     OXACILLIN 0.5 SENSITIVE Sensitive     TETRACYCLINE <=1 SENSITIVE Sensitive     VANCOMYCIN <=0.5 SENSITIVE Sensitive     TRIMETH/SULFA >=320 RESISTANT Resistant     CLINDAMYCIN <=0.25 SENSITIVE Sensitive     RIFAMPIN <=0.5 SENSITIVE Sensitive     Inducible Clindamycin NEGATIVE Sensitive     LINEZOLID 2 SENSITIVE Sensitive     * STAPHYLOCOCCUS AUREUS  Blood Culture ID Panel (Reflexed)     Status: Abnormal   Collection Time: 10/14/23 11:03 PM  Result Value Ref Range Status   Enterococcus faecalis NOT DETECTED NOT DETECTED Final   Enterococcus Faecium NOT DETECTED NOT DETECTED Final   Listeria monocytogenes NOT DETECTED NOT DETECTED Final   Staphylococcus species DETECTED (A) NOT DETECTED Final    Comment: CRITICAL RESULT CALLED TO, READ BACK BY AND VERIFIED WITH: RN RENEE TEJEDA ON 10/15/23 @ 1733 BY DRT     Staphylococcus  aureus (BCID) DETECTED (A) NOT DETECTED Final    Comment: CRITICAL RESULT CALLED TO, READ BACK BY AND VERIFIED WITH: RN RENEE TEJEDA ON 10/15/23 @ 1733 BY DRT     Staphylococcus epidermidis NOT DETECTED NOT DETECTED Final   Staphylococcus lugdunensis NOT DETECTED NOT DETECTED Final   Streptococcus species NOT DETECTED NOT DETECTED Final   Streptococcus agalactiae NOT DETECTED NOT DETECTED Final   Streptococcus pneumoniae NOT DETECTED NOT DETECTED Final   Streptococcus pyogenes NOT DETECTED NOT DETECTED Final   A.calcoaceticus-baumannii NOT DETECTED NOT DETECTED Final   Bacteroides fragilis NOT DETECTED NOT DETECTED Final   Enterobacterales NOT DETECTED NOT DETECTED Final   Enterobacter cloacae complex NOT DETECTED NOT DETECTED Final   Escherichia coli NOT DETECTED NOT DETECTED Final   Klebsiella aerogenes NOT DETECTED NOT DETECTED Final   Klebsiella oxytoca NOT DETECTED NOT DETECTED Final   Klebsiella pneumoniae NOT DETECTED NOT DETECTED Final   Proteus species NOT DETECTED NOT DETECTED Final   Salmonella species NOT DETECTED NOT DETECTED Final   Serratia marcescens NOT DETECTED NOT DETECTED Final   Haemophilus influenzae NOT DETECTED NOT DETECTED Final   Neisseria meningitidis NOT DETECTED NOT DETECTED Final   Pseudomonas aeruginosa NOT DETECTED NOT DETECTED Final   Stenotrophomonas maltophilia NOT DETECTED NOT DETECTED Final   Candida albicans NOT DETECTED NOT DETECTED Final   Candida auris NOT DETECTED NOT DETECTED Final   Candida glabrata NOT DETECTED NOT DETECTED Final   Candida krusei NOT DETECTED NOT DETECTED Final   Candida parapsilosis NOT DETECTED NOT DETECTED Final   Candida tropicalis NOT DETECTED NOT DETECTED Final   Cryptococcus neoformans/gattii NOT DETECTED NOT DETECTED Final   Meth resistant mecA/C and MREJ NOT DETECTED NOT DETECTED Final    Comment: Performed at [facility], [address] [place], [place] [address]  Blood culture (routine x 2)     Status:  Abnormal   Collection Time: 10/14/23 11:06 PM   Specimen: BLOOD  Result Value Ref Range Status   Specimen Description   Final    BLOOD BLOOD RIGHT ARM Performed at [facility], [address] [place], [place] [address]    Special Requests   Final    BOTTLES DRAWN AEROBIC AND ANAEROBIC Blood Culture adequate volume Performed at [facility], [address] [place], [place] [address]    Culture  Setup Time   Final    GRAM POSITIVE COCCI IN BOTH AEROBIC AND ANAEROBIC BOTTLES Gram Stain Report Called to,Read Back By and Verified With: Wilburt Finlay, RN AT 1123 10/15/23 BY A. SNYDER    Culture (A)  Final    STAPHYLOCOCCUS AUREUS SUSCEPTIBILITIES PERFORMED ON PREVIOUS CULTURE WITHIN THE LAST 5 DAYS. Performed at [facility], [address], [place], [place] [address]    Report Status 10/17/2023 FINAL  Final  Culture, blood (single) w Reflex to ID Panel     Status: None   Collection Time: 10/15/23  5:41 AM   Specimen: BLOOD  Result Value Ref Range Status   Specimen Description BLOOD BLOOD RIGHT HAND  Final   Special Requests   Final    BOTTLES DRAWN AEROBIC ONLY Blood Culture adequate volume   Culture   Final    NO GROWTH 5 DAYS Performed at [facility], [address] [place], [place] [address]    Report Status 10/20/2023 FINAL  Final  Culture, blood (Routine X 2) w Reflex to ID Panel     Status: None   Collection Time: 10/16/23  4:08 AM   Specimen: BLOOD  Result Value Ref Range Status   Specimen Description BLOOD BLOOD LEFT HAND  Final   Special Requests   Final    BOTTLES DRAWN AEROBIC AND ANAEROBIC Blood Culture adequate volume   Culture   Final    NO GROWTH 5 DAYS Performed at [facility], [address], [place], [place] [address]    Report Status 10/21/2023 FINAL  Final  Culture, blood (Routine X 2) w Reflex to ID Panel     Status: None   Collection Time: 10/16/23  4:08 AM   Specimen: BLOOD  Result Value Ref Range Status   Specimen Description BLOOD BLOOD  LEFT WRIST  Final   Special Requests   Final    BOTTLES DRAWN AEROBIC AND ANAEROBIC Blood Culture adequate volume   Culture   Final    NO GROWTH 5 DAYS Performed at [facility], [address] [place], [place] [address]    Report Status 10/21/2023 FINAL  Final  MRSA Next Gen by PCR, Nasal     Status: None   Collection Time: 10/19/23  4:03 PM   Specimen: Nasal Mucosa; Nasal Swab  Result Value Ref Range Status   MRSA by PCR Next Gen NOT DETECTED NOT DETECTED Final    Comment: (NOTE) The GeneXpert MRSA Assay (FDA approved for NASAL specimens only), is one component of a comprehensive MRSA colonization surveillance program. It is not intended to diagnose MRSA infection nor to guide or monitor treatment for MRSA infections. Test performance is not FDA approved in patients less than 43 years old. Performed at [facility], [address], [place], [place] [address]      Radiology Studies: CT CHEST W CONTRAST Result Date: 10/19/2023 CLINICAL DATA:  Pneumonia, septic emboli, worsening pleuritic pain, persistent fever. EXAM: CT CHEST WITH CONTRAST TECHNIQUE: Multidetector CT imaging of the chest was performed during intravenous contrast administration. RADIATION DOSE REDUCTION: This exam was performed according to the departmental dose-optimization program which includes automated exposure control, adjustment of the mA and/or kV according to patient size and/or use of iterative reconstruction technique. CONTRAST:  75mL OMNIPAQUE IOHEXOL 300 MG/ML  SOLN COMPARISON:  10/14/2023 chest CT angiogram. FINDINGS: Cardiovascular: Normal heart size. No significant pericardial effusion/thickening. Great vessels are normal in course and caliber. No central pulmonary emboli. Mediastinum/Nodes: No significant thyroid nodules. Unremarkable esophagus. No pathologically enlarged axillary, mediastinal or hilar lymph nodes. Lungs/Pleura: No pneumothorax. Loculated small to moderate anterior, posterior and major fissural  right pleural effusion, new from prior chest CT. No left pleural effusion. Near complete right lower lobe lung consolidation with some volume loss, substantially worsened. New moderate right middle lobe consolidation with volume loss. Subsegmental atelectasis in the posterior upper lobes bilaterally and in the left lower lobe. Upper abdomen: No acute abnormality. Musculoskeletal:  No aggressive appearing focal osseous lesions. IMPRESSION: 1. Loculated small to moderate anterior, posterior and major fissural right pleural effusion, new from prior chest CT. 2. Near complete right lower lobe lung consolidation with some volume loss, substantially worsened. New moderate right middle lobe consolidation with volume loss. Findings are compatible with worsened multilobar pneumonia. 3. Subsegmental atelectasis in the posterior upper lobes bilaterally and in the left lower lobe. Electronically Signed   By: Delbert Phenix M.D.   On: 10/19/2023 13:32    Scheduled Meds:  acetaminophen  1,000 mg Oral Q6H   enoxaparin (LOVENOX) injection  40 mg Subcutaneous Q24H   gabapentin  300 mg Oral TID   lidocaine  1  patch Transdermal Q24H   methadone  40 mg Oral Daily   nicotine  21 mg Transdermal Daily   pantoprazole  40 mg Oral Daily   sodium chloride flush  3 mL Intravenous Q12H   Continuous Infusions:  ampicillin-sulbactam (UNASYN) IV 3 g (10/21/23 0453)     LOS: 6 days   MDM: Patient is high risk for one or more organ failure.  They necessitate ongoing hospitalization for continued IV therapies and subsequent lab monitoring. Total time spent interpreting labs and vitals, coordinating care amongst consultants and care team members, directly assessing and discussing care with the patient and/or family: 55 min   Debarah Crape, DO [facility]  To contact the attending physician between 7A-7P please use Epic Chat. To contact the covering physician during after hours 7P-7A, please review Amion.   10/21/2023,  8:36 AM   *This document has been created with the assistance of dictation software. Please excuse typographical errors. *

## 2023-10-21 NOTE — Consult Note (Signed)
 NAME:  Pam Soto, MRN:  [id number], DOB:  October 02, 1990, LOS: 6 ADMISSION DATE:  10/14/2023, CONSULTATION DATE:  3/11 REFERRING MD:  Rennis Chris, CHIEF COMPLAINT:  pleural effusion    History of Present Illness:  33 year old female patient who was initially admitted on 3/4 with chief complaint of right sided chest and back discomfort, with associated shortness of breath also Noting fever and chills.  Reportedly had fever the week prior..  Admitted to the hospital service on arrival febrile, room air sats 90s sodium 129 respiratory viral panel was negative chest x-ray concerning for multifocal pneumonia.  CT concerning for possible septic emboli culture sent started on broad-spectrum antibiotics.  Blood cultures positive for MSSA on 3/4, followed by infectious disease in addition to internal medicine service.  Initial echocardiogram negative for evidence of endocarditis.  TEE also completed this was negative for endocarditis as well.  Over the course of her mission antibiotics have been tailored by infectious disease, transitioned to cefazolin with plan to treat for 4 weeks followed by dose of oritavancin/dalbavancin on 4/2 to complete the 6-week course.  She has had fairly consistent and worsening right sided chest discomfort repeated noncontrasted CT on the ninth showed moderate right-sided effusion with possible element of loculation.  She was subsequently transferred to [facility] for pulmonary evaluation  Pertinent  Medical History  IV drug abuse, posttraumatic stress disorder Significant Hospital Events: Including procedures, antibiotic start and stop dates in addition to other pertinent events   3/4 admitted with right sided chest pain and fever, cultures positive for MSSA started on broad-spectrum antibiotics 3/5 seen by infectious disease for bacteremia 3/6 antibiotics changed to cefazolin per recommendation of infectious disease 3/7 TEE negative for endocarditis 3/9 increased pleuritic discomfort  CT chest small to moderate size right pleural effusion.  Scattered areas on the left side continue to raise concern for septic emboli 3/10 moved to [facility] for pulmonary evaluation 3/11 pulmonary consultation, going to interventional radiology for thoracentesis  Interim History / Subjective:    Objective   Blood pressure (!) 132/93, pulse 77, temperature 99.1 F (37.3 C), temperature source Oral, resp. rate 16, height 5\' 7"  (1.702 m), weight 81.6 kg, SpO2 98%, unknown if currently breastfeeding.        Intake/Output Summary (Last 24 hours) at 10/21/2023 0943 Last data filed at 10/20/2023 2300 Gross per 24 hour  Intake 537 ml  Output 0 ml  Net 537 ml   Filed Weights   10/14/23 2300 10/17/23 1151  Weight: 81.6 kg 81.6 kg    Examination: Blood pressure (!) 132/93, pulse 77, temperature 99.1 F (37.3 C), temperature source Oral, resp. rate 16, height 5\' 7"  (1.702 m), weight 81.6 kg, SpO2 98%, unknown if currently breastfeeding. Gen:      No acute distress HEENT:  EOMI, sclera anicteric Neck:     No masses; no thyromegaly Lungs:    Clear to auscultation bilaterally; normal respiratory effort CV:         Regular rate and rhythm; no murmurs Abd:      + bowel sounds; soft, non-tender; no palpable masses, no distension Ext:    No edema; adequate peripheral perfusion Skin:      Warm and dry; no rash Neuro: alert and oriented x 3 Psych: normal mood and affect   Resolved Hospital Problem list     Assessment & Plan:  Bilateral pneumonia with cavitary changes Right effusion MSSA bacteremia in the setting of IV drug use No evidence of endocarditis on  echocardiogram Now has rapid development of right effusion on CT scan which was not seen previously 5 days ago S/p thoracentesis by IR Will review pleural studies and follow up. If there is evidence of empyema then she may need a chest tube Continue antibiotics per ID.  Best Practice (right click and "Reselect all SmartList  Selections" daily)   Per primary team  Signature:   [name] MD [place] [facility] See Amion for pager  If no response to pager , please call [phone] until 7pm After 7:00 pm call Elink  [phone] 10/21/2023, 12:04 PM

## 2023-10-21 NOTE — Procedures (Signed)
 Ultrasound-guided diagnostic and therapeutic right sided thoracentesis performed yielding 300 milliliters of amber colored fluid. No immediate complications.   Diagnostic fluid was sent to the lab for further analysis. Follow-up chest x-ray pending. EBL is < 2 ml.

## 2023-10-22 DIAGNOSIS — J90 Pleural effusion, not elsewhere classified: Secondary | ICD-10-CM | POA: Diagnosis not present

## 2023-10-22 DIAGNOSIS — F1910 Other psychoactive substance abuse, uncomplicated: Secondary | ICD-10-CM

## 2023-10-22 DIAGNOSIS — J189 Pneumonia, unspecified organism: Secondary | ICD-10-CM

## 2023-10-22 DIAGNOSIS — B9561 Methicillin susceptible Staphylococcus aureus infection as the cause of diseases classified elsewhere: Secondary | ICD-10-CM

## 2023-10-22 DIAGNOSIS — E871 Hypo-osmolality and hyponatremia: Secondary | ICD-10-CM

## 2023-10-22 DIAGNOSIS — R9431 Abnormal electrocardiogram [ECG] [EKG]: Secondary | ICD-10-CM

## 2023-10-22 DIAGNOSIS — J869 Pyothorax without fistula: Secondary | ICD-10-CM

## 2023-10-22 DIAGNOSIS — A4101 Sepsis due to Methicillin susceptible Staphylococcus aureus: Secondary | ICD-10-CM

## 2023-10-22 DIAGNOSIS — E876 Hypokalemia: Secondary | ICD-10-CM

## 2023-10-22 DIAGNOSIS — J15211 Pneumonia due to Methicillin susceptible Staphylococcus aureus: Secondary | ICD-10-CM | POA: Diagnosis not present

## 2023-10-22 LAB — COMPREHENSIVE METABOLIC PANEL
ALT: 10 U/L (ref 0–44)
AST: 14 U/L — ABNORMAL LOW (ref 15–41)
Albumin: 1.9 g/dL — ABNORMAL LOW (ref 3.5–5.0)
Alkaline Phosphatase: 66 U/L (ref 38–126)
Anion gap: 7 (ref 5–15)
BUN: 13 mg/dL (ref 6–20)
CO2: 23 mmol/L (ref 22–32)
Calcium: 8.3 mg/dL — ABNORMAL LOW (ref 8.9–10.3)
Chloride: 105 mmol/L (ref 98–111)
Creatinine, Ser: 0.79 mg/dL (ref 0.44–1.00)
GFR, Estimated: >60 mL/min (ref >60)
Glucose, Bld: 106 mg/dL — ABNORMAL HIGH (ref 70–99)
Potassium: 4.1 mmol/L (ref 3.5–5.1)
Sodium: 135 mmol/L (ref 135–145)
Total Bilirubin: 0.3 mg/dL (ref 0.0–1.2)
Total Protein: 6.2 g/dL — ABNORMAL LOW (ref 6.5–8.1)

## 2023-10-22 LAB — CBC WITH DIFFERENTIAL/PLATELET
Abs Immature Granulocytes: 0.06 10*3/uL (ref 0.00–0.07)
Basophils Absolute: 0 10*3/uL (ref 0.0–0.1)
Basophils Relative: 0 %
Eosinophils Absolute: 0.2 10*3/uL (ref 0.0–0.5)
Eosinophils Relative: 2 %
HCT: 29.9 % — ABNORMAL LOW (ref 36.0–46.0)
Hemoglobin: 9.7 g/dL — ABNORMAL LOW (ref 12.0–15.0)
Immature Granulocytes: 1 %
Lymphocytes Relative: 24 %
Lymphs Abs: 2.3 10*3/uL (ref 0.7–4.0)
MCH: 29 pg (ref 26.0–34.0)
MCHC: 32.4 g/dL (ref 30.0–36.0)
MCV: 89.3 fL (ref 80.0–100.0)
Monocytes Absolute: 0.7 10*3/uL (ref 0.1–1.0)
Monocytes Relative: 7 %
Neutro Abs: 6.1 10*3/uL (ref 1.7–7.7)
Neutrophils Relative %: 66 %
Platelets: 439 10*3/uL — ABNORMAL HIGH (ref 150–400)
RBC: 3.35 MIL/uL — ABNORMAL LOW (ref 3.87–5.11)
RDW: 12.7 % (ref 11.5–15.5)
WBC: 9.3 10*3/uL (ref 4.0–10.5)
nRBC: 0 % (ref 0.0–0.2)

## 2023-10-22 LAB — MAGNESIUM: Magnesium: 2.1 mg/dL (ref 1.7–2.4)

## 2023-10-22 LAB — TRIGLYCERIDES, BODY FLUIDS: Triglycerides, Fluid: 56 mg/dL

## 2023-10-22 LAB — CYTOLOGY - NON PAP

## 2023-10-22 MED ORDER — KETOROLAC TROMETHAMINE 15 MG/ML IJ SOLN
15.0000 mg | Freq: Once | INTRAMUSCULAR | Status: AC
  Administered 2023-10-22: 15 mg via INTRAVENOUS
  Filled 2023-10-22: qty 1

## 2023-10-22 NOTE — Progress Notes (Signed)
 NAME:  Pam Soto, MRN:  [id number], DOB:  March 21, 1991, LOS: 7 ADMISSION DATE:  10/14/2023, CONSULTATION DATE:  3/11 REFERRING MD:  Rennis Chris, CHIEF COMPLAINT:  pleural effusion    History of Present Illness:  33 year old female patient who was initially admitted on 3/4 with chief complaint of right sided chest and back discomfort, with associated shortness of breath also Noting fever and chills.  Reportedly had fever the week prior..  Admitted to the hospital service on arrival febrile, room air sats 90s sodium 129 respiratory viral panel was negative chest x-ray concerning for multifocal pneumonia.  CT concerning for possible septic emboli culture sent started on broad-spectrum antibiotics.  Blood cultures positive for MSSA on 3/4, followed by infectious disease in addition to internal medicine service.  Initial echocardiogram negative for evidence of endocarditis.  TEE also completed this was negative for endocarditis as well.  Over the course of her mission antibiotics have been tailored by infectious disease, transitioned to cefazolin with plan to treat for 4 weeks followed by dose of oritavancin/dalbavancin on 4/2 to complete the 6-week course.  She has had fairly consistent and worsening right sided chest discomfort repeated noncontrasted CT on the ninth showed moderate right-sided effusion with possible element of loculation.  She was subsequently transferred to [facility] for pulmonary evaluation  Pertinent  Medical History  IV drug abuse, posttraumatic stress disorder Significant Hospital Events: Including procedures, antibiotic start and stop dates in addition to other pertinent events   3/4 admitted with right sided chest pain and fever, cultures positive for MSSA started on broad-spectrum antibiotics 3/5 seen by infectious disease for bacteremia 3/6 antibiotics changed to cefazolin per recommendation of infectious disease 3/7 TEE negative for endocarditis 3/9 increased pleuritic discomfort  CT chest small to moderate size right pleural effusion.  Scattered areas on the left side continue to raise concern for septic emboli 3/10 moved to [facility] for pulmonary evaluation 3/11 pulmonary consultation, going to interventional radiology for thoracentesis  Interim History / Subjective:   Underwent thoracentesis by interventional radiology Still complains of right chest pain  Objective   Blood pressure 111/71, pulse 93, temperature 98.1 F (36.7 C), resp. rate 18, height 5\' 7"  (1.702 m), weight 81.6 kg, SpO2 98%, unknown if currently breastfeeding.       No intake or output data in the 24 hours ending 10/22/23 1512  Filed Weights   10/14/23 2300 10/17/23 1151  Weight: 81.6 kg 81.6 kg    Examination: Blood pressure 111/71, pulse 93, temperature 98.1 F (36.7 C), resp. rate 18, height 5\' 7"  (1.702 m), weight 81.6 kg, SpO2 98%, unknown if currently breastfeeding. Gen:      No acute distress HEENT:  EOMI, sclera anicteric Neck:     No masses; no thyromegaly Lungs:    Clear to auscultation bilaterally; normal respiratory effort CV:         Regular rate and rhythm; no murmurs Abd:      + bowel sounds; soft, non-tender; no palpable masses, no distension Ext:    No edema; adequate peripheral perfusion Skin:      Warm and dry; no rash Neuro: alert and oriented x 3 Psych: normal mood and affect   Resolved Hospital Problem list     Assessment & Plan:  Bilateral pneumonia with cavitary changes Right effusion MSSA bacteremia in the setting of IV drug use No evidence of endocarditis on echocardiogram Now has rapid development of right effusion on CT scan which was not seen previously 5  days ago S/p thoracentesis by IR.  Fluid studies show exudative effusion.  Repeat chest x-ray tomorrow Continue antibiotics per ID.  Best Practice (right click and "Reselect all SmartList Selections" daily)   Per primary team  Signature:   [name] MD [place] [facility] See Amion for pager  If no response to pager , please call [phone] until 7pm After 7:00 pm call Elink  [phone] 10/22/2023, 3:12 PM

## 2023-10-22 NOTE — Progress Notes (Addendum)
 PROGRESS NOTE    Pam Soto  UYQ:[id number] DOB: Apr 19, 1991 DOA: 10/14/2023 PCP: Mechele Claude, MD    No chief complaint on file.   Brief Narrative:  Pam Soto is 33 y.o. female with history of IV drug abuse who presents with right-sided chest pain and shortness of breath.  Patient endorses muscle cramps over the past few days which she believes are secondary to depleted electrolytes.  She reports her pleuritic chest pain has become severe and thus presented to the hospital.  She reports she has been trying to abstain from illicit substances and has been taking methadone.  She was concerned that her musculoskeletal pain was a result of withdrawal and she injected fentanyl yesterday.  On arrival to the ED patient was febrile, saturating the mid 90s on room air, tachypneic, tachycardic.  Labs reveal hyponatremia to 129, hypokalemia to 2.9, hypomagnesemia 1.6, leukocytosis 12.4 K.  Respiratory viral panel was negative.  Chest x-ray concerning for multifocal pneumonia.  CTA concerning for multifocal infiltrates which may be septic emboli.  Blood cultures were collected, the patient was started on IV fluids.    Assessment & Plan:   Principal Problem:   Pneumonia Active Problems:   Hyponatremia   Hypokalemia   Hypomagnesemia   Prolonged QT interval   IV drug abuse (HCC)   Multifocal pneumonia   Gram-positive cocci bacteremia   Empyema lung (HCC)   Sepsis due to methicillin susceptible Staphylococcus aureus (HCC)   MSSA bacteremia   #1 loculated right-sided pleural effusion/empyema/multifocal pneumonia -Patient noted to have some shortness of breath, ongoing pleuritic chest pain, repeat CT chest done concerning for pleural effusion and worsening multilobar pneumonia. -Patient seen in consultation by PCCM underwent therapeutic and diagnostic thoracentesis 10/21/2023 per IR with 300 cc of amber-colored fluid extracted. -Cultures pending. -By lights criteria exudative  process. -Patient with MSSA bacteremia in the setting of IV drug use. -Pulmonary following and recommending repeat chest x-ray tomorrow. -Continue empiric IV antibiotics of Unasyn. -ID and pulmonary following and appreciate input and recommendations.  2.  MSSA bacteremia/multifocal pneumonia -Patient on admission noted to have multifocal infiltrates noted in right lower lobe on initial CT angiogram chest with concerns for septic emboli. -Repeat CT revealed worsening pleural effusion. -3/4 blood cultures positive for MSSA bacteremia. -Repeat cultures from 10/16/2023 negative to date. -2D echo and TEE negative for vegetation/endocarditis. 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 -Patient noted will need cefazolin for 4 weeks from 3/5 with EOT on/11/11/2023 followed by dose of oritavancin/dalbavancin on 11/12/2023 to complete a 6-week course of antibiotic treatment. -Due to current effusion/?  Empyema patient currently on IV Unasyn. -Due to patient's active IV drug use she is not a candidate for PICC therapy and will need to remain in the hospital in supervised setting for duration of treatment.  Patient noted to be amenable to this. -Continue incentive spirometry, flutter valve. -ID following and appreciate input and recommendations.  3.  Hep B nonimmune -Consider vaccination once acute illness has resolved.  4.  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 -Will need outpatient treatment. -Will defer to ID.  5.  Sepsis -Patient met criteria for sepsis on arrival with leukocytosis, tachycardia, tachypnea, likely secondary to pneumonia, bacteremia, probable empyema. -Continue current empiric IV antibiotics as noted above.  6.  Normocytic anemia -Patient with no overt bleeding. -Hemoglobin currently stable at 9.7. -Check an anemia panel. -  Follow H&H. -Transfusion  threshold hemoglobin < 7.  7.  Polysubstance abuse/IV drug abuse -Patient noted to have almost daily use of heroin/fentanyl, cocaine.-Occasional use of benzodiazepines though not consistently.  Patient denies alcohol use. -Patient noted to be working towards abstinence and on methadone in outpatient setting. -Patient noted to use IV fentanyl the day prior to arrival. -Patient desires continued abstinence and would like medication assisted withdrawal while admitted admitted. -Continue home regimen methadone. -TOC consulted for resource assistance.  8.  Right-sided pleuritis -Likely secondary to problem #1. -Continue current regimen of methadone, gabapentin, Toradol, Tylenol, lidocaine patches, as needed oxycodone. -Continue treatment as improvement #1.  9.  Hypokalemia/hypomagnesemia -Repleted.  10.  Prolonged QT -Optimize electrolytes, avoid QT prolongation medications. -Repeat EKG 3/5 showed resolution of QT prolongation.  11.  Hyponatremia -Resolved.  12.  AKI, POA -Resolved.      DVT prophylaxis: Lovenox Code Status: Full Family Communication: Updated patient.  No family at bedside. Disposition: Home once IV antibiotics are completed.  Status is: Inpatient Remains inpatient appropriate because: Severity of illness, need for IV antibiotics will need until/08/2023, not a candidate for PICC therapy.  Will need to remain in-house until at least IV antibiotics are completed.   Consultants:  PCCM/pulmonary: Dr. Isaiah Serge 10/21/2023 ID: Dr. Elinor Parkinson 10/15/2023  Procedures:  CT angiogram chest 10/14/2023 CT chest with contrast 10/19/2023 Chest x-ray 10/14/2023, 10/21/2023 2D echo 10/15/2023 TEE 10/17/2023 Ultrasound-guided diagnostic and therapeutic right-sided thoracentesis yielding 300 cc of amber-colored fluid: Per IR: Anders Grant, NP 10/21/2023  Antimicrobials:  Anti-infectives (From admission, onward)    Start     Dose/Rate Route Frequency Ordered Stop   10/19/23 1700   Ampicillin-Sulbactam (UNASYN) 3 g in sodium chloride 0.9 % 100 mL IVPB        3 g 200 mL/hr over 30 Minutes Intravenous Every 6 hours 10/19/23 1609     10/16/23 0915  ceFAZolin (ANCEF) IVPB 2g/100 mL premix  Status:  Discontinued        2 g 200 mL/hr over 30 Minutes Intravenous Every 8 hours 10/16/23 0826 10/19/23 1610   10/15/23 1000  vancomycin (VANCOCIN) IVPB 1000 mg/200 mL premix  Status:  Discontinued        1,000 mg 200 mL/hr over 60 Minutes Intravenous Every 12 hours 10/15/23 0406 10/16/23 0826   10/15/23 0600  cefTRIAXone (ROCEPHIN) 2 g in sodium chloride 0.9 % 100 mL IVPB  Status:  Discontinued        2 g 200 mL/hr over 30 Minutes Intravenous Every 24 hours 10/15/23 0354 10/16/23 0826   10/15/23 0400  doxycycline (VIBRAMYCIN) 100 mg in sodium chloride 0.9 % 250 mL IVPB  Status:  Discontinued        100 mg 125 mL/hr over 120 Minutes Intravenous Every 12 hours 10/15/23 0354 10/16/23 0826   10/14/23 2315  ceFEPIme (MAXIPIME) 2 g in sodium chloride 0.9 % 100 mL IVPB        2 g 200 mL/hr over 30 Minutes Intravenous  Once 10/14/23 2303 10/15/23 0109   10/14/23 2315  Vancomycin (VANCOCIN) 1,500 mg in sodium chloride 0.9 % 500 mL IVPB        1,500 mg 250 mL/hr over 120 Minutes Intravenous  Once 10/14/23 2304 10/15/23 0228         Subjective: C/o of SOB and right sided pleuritic CP. States pulm have ordered repeat CT chest for tomoorrow.  Tolerating current diet.  Objective: Vitals:   10/21/23 1508 10/21/23 1937 10/22/23 0631 10/22/23 1406  BP: 111/73 131/84 111/70 111/71  Pulse: 80 89 72 93  Resp:  17 18 18   Temp: 98.6 F (37 C) 98.4 F (36.9 C) 98.2 F (36.8 C) 98.1 F (36.7 C)  TempSrc: Oral Oral Oral   SpO2: 96% 96% 96% 98%  Weight:      Height:        Intake/Output Summary (Last 24 hours) at 10/22/2023 1857 Last data filed at 10/22/2023 1748 Gross per 24 hour  Intake 600 ml  Output --  Net 600 ml   Filed Weights   10/14/23 2300 10/17/23 1151  Weight: 81.6 kg  81.6 kg    Examination:  General exam: Appears calm and comfortable  Respiratory system: Decreased  BS right base.no wheezing, no crackles, no significant rhonchi.  Speaking in full sentences.  No use of accessory muscles of respiration.   Cardiovascular system: S1 & S2 heard, RRR. No JVD, murmurs, rubs, gallops or clicks. No pedal edema. Gastrointestinal system: Abdomen is nondistended, soft and nontender. No organomegaly or masses felt. Normal bowel sounds heard. Central nervous system: Alert and oriented. No focal neurological deficits. Extremities: Symmetric 5 x 5 power. Skin: No rashes, lesions or ulcers Psychiatry: Judgement and insight appear normal. Mood & affect appropriate.     Data Reviewed: I have personally reviewed following labs and imaging studies  CBC: Recent Labs  Lab 10/18/23 0505 10/19/23 0612 10/20/23 0326 10/20/23 1855 10/21/23 0626 10/22/23 0536  WBC 8.7 8.1 8.2 9.8 7.7 9.3  NEUTROABS 5.0 4.3 5.5  --  4.3 6.1  HGB 10.8* 9.1* 10.1* 10.4* 9.6* 9.7*  HCT 35.5* 27.8* 31.3* 31.7* 28.7* 29.9*  MCV 93.9 89.4 89.7 88.3 87.5 89.3  PLT 310 326 400 417* 420* 439*    Basic Metabolic Panel: Recent Labs  Lab 10/16/23 0408 10/17/23 0316 10/18/23 0505 10/19/23 0612 10/20/23 0326 10/21/23 0626 10/22/23 0536  NA 134* 137 139 138 136 137 135  K 3.7 3.5 3.5 3.6 4.0 4.0 4.1  CL 109 107 106 103 103 106 105  CO2 21* 24 26 25 23 24 23   GLUCOSE 114* 96 78 94 88 95 106*  BUN 8 6 8 8 8 11 13   CREATININE 0.60 0.55 0.66 0.56 0.58 0.62 0.79  CALCIUM 7.9* 7.8* 8.3* 8.4* 8.6* 8.3* 8.3*  MG 1.9 1.7 2.0 1.9 1.9 2.0 2.1  PHOS 2.3* 3.3 4.5 4.7* 4.8*  --   --     GFR: Estimated Creatinine Clearance: 113 mL/min (by C-G formula based on SCr of 0.79 mg/dL).  Liver Function Tests: Recent Labs  Lab 10/18/23 0505 10/19/23 0612 10/20/23 0326 10/21/23 0626 10/22/23 0536  AST 20 17 19 15  14*  ALT 15 12 11 10 10   ALKPHOS 83 68 67 61 66  BILITOT 0.2 <0.2 0.3 0.6 0.3   PROT 6.9 6.4* 6.6 6.1* 6.2*  ALBUMIN 2.6* 2.2* 2.2* 1.9* 1.9*    CBG: No results for input(s): "GLUCAP" in the last 168 hours.   Recent Results (from the past 240 hours)  Resp panel by RT-PCR (RSV, Flu A&B, Covid) Anterior Nasal Swab     Status: None   Collection Time: 10/14/23  8:56 PM   Specimen: Anterior Nasal Swab  Result Value Ref Range Status   SARS Coronavirus 2 by RT PCR NEGATIVE NEGATIVE Final    Comment: (NOTE) SARS-CoV-2 target nucleic acids are NOT DETECTED.  The SARS-CoV-2 RNA is generally detectable in upper respiratory specimens during the acute phase of infection. The lowest concentration of SARS-CoV-2  viral copies this assay can detect is 138 copies/mL. A negative result does not preclude SARS-Cov-2 infection and should not be used as the sole basis for treatment or other patient management decisions. A negative result may occur with  improper specimen collection/handling, submission of specimen other than nasopharyngeal swab, presence of viral mutation(s) within the areas targeted by this assay, and inadequate number of viral copies(<138 copies/mL). A negative result must be combined with clinical observations, patient history, and epidemiological information. The expected result is Negative.  Fact Sheet for Patients:  BloggerCourse.com  Fact Sheet for Healthcare Providers:  SeriousBroker.it  This test is no t yet approved or cleared by the [place] FDA and  has been authorized for detection and/or diagnosis of SARS-CoV-2 by FDA under an Emergency Use Authorization (EUA). This EUA will remain  in effect (meaning this test can be used) for the duration of the COVID-19 declaration under Section 564(b)(1) of the Act, 21 U.S.C.section 360bbb-3(b)(1), unless the authorization is terminated  or revoked sooner.       Influenza A by PCR NEGATIVE NEGATIVE Final   Influenza B by PCR NEGATIVE NEGATIVE Final     Comment: (NOTE) The Xpert Xpress SARS-CoV-2/FLU/RSV plus assay is intended as an aid in the diagnosis of influenza from Nasopharyngeal swab specimens and should not be used as a sole basis for treatment. Nasal washings and aspirates are unacceptable for Xpert Xpress SARS-CoV-2/FLU/RSV testing.  Fact Sheet for Patients: BloggerCourse.com  Fact Sheet for Healthcare Providers: SeriousBroker.it  This test is not yet approved or cleared by the [place] FDA and has been authorized for detection and/or diagnosis of SARS-CoV-2 by FDA under an Emergency Use Authorization (EUA). This EUA will remain in effect (meaning this test can be used) for the duration of the COVID-19 declaration under Section 564(b)(1) of the Act, 21 U.S.C. section 360bbb-3(b)(1), unless the authorization is terminated or revoked.     Resp Syncytial Virus by PCR NEGATIVE NEGATIVE Final    Comment: (NOTE) Fact Sheet for Patients: BloggerCourse.com  Fact Sheet for Healthcare Providers: SeriousBroker.it  This test is not yet approved or cleared by the [place] FDA and has been authorized for detection and/or diagnosis of SARS-CoV-2 by FDA under an Emergency Use Authorization (EUA). This EUA will remain in effect (meaning this test can be used) for the duration of the COVID-19 declaration under Section 564(b)(1) of the Act, 21 U.S.C. section 360bbb-3(b)(1), unless the authorization is terminated or revoked.  Performed at [facility], [address] [place], [place] [address]   Blood culture (routine x 2)     Status: Abnormal   Collection Time: 10/14/23 11:03 PM   Specimen: BLOOD  Result Value Ref Range Status   Specimen Description   Final    BLOOD BLOOD RIGHT ARM Performed at [facility], [address] [place], [place] [address]    Special Requests   Final    BOTTLES DRAWN AEROBIC AND ANAEROBIC  Blood Culture adequate volume Performed at [facility], [address] [place], [place] [address]    Culture  Setup Time   Final    GRAM POSITIVE COCCI ANAEROBIC BOTTLE ONLY Gram Stain Report Called to,Read Back By and Verified With: Wilburt Finlay, RN AT 1123 10/15/23 BY A. SNYDER AEROBIC BOTTLE ONLY GRAM POSITIVE COCCI GRAM STAIN REVIEWED-AGREE WITH RESULT CRITICAL RESULT CALLED TO, READ BACK BY AND VERIFIED WITH: RN RENEE TEJEDA ON 10/15/23 @ 1733 BY DRT  Performed at [facility], [address],  [place], [place] [address]    Culture STAPHYLOCOCCUS AUREUS (A)  Final   Report Status 10/17/2023 FINAL  Final   Organism ID, Bacteria STAPHYLOCOCCUS AUREUS  Final      Susceptibility   Staphylococcus aureus - MIC*    CIPROFLOXACIN >=8 RESISTANT Resistant     ERYTHROMYCIN >=8 RESISTANT Resistant     GENTAMICIN <=0.5 SENSITIVE Sensitive     OXACILLIN 0.5 SENSITIVE Sensitive     TETRACYCLINE <=1 SENSITIVE Sensitive     VANCOMYCIN <=0.5 SENSITIVE Sensitive     TRIMETH/SULFA >=320 RESISTANT Resistant     CLINDAMYCIN <=0.25 SENSITIVE Sensitive     RIFAMPIN <=0.5 SENSITIVE Sensitive     Inducible Clindamycin NEGATIVE Sensitive     LINEZOLID 2 SENSITIVE Sensitive     * STAPHYLOCOCCUS AUREUS  Blood Culture ID Panel (Reflexed)     Status: Abnormal   Collection Time: 10/14/23 11:03 PM  Result Value Ref Range Status   Enterococcus faecalis NOT DETECTED NOT DETECTED Final   Enterococcus Faecium NOT DETECTED NOT DETECTED Final   Listeria monocytogenes NOT DETECTED NOT DETECTED Final   Staphylococcus species DETECTED (A) NOT DETECTED Final    Comment: CRITICAL RESULT CALLED TO, READ BACK BY AND VERIFIED WITH: RN RENEE TEJEDA ON 10/15/23 @ 1733 BY DRT     Staphylococcus aureus (BCID) DETECTED (A) NOT DETECTED Final    Comment: CRITICAL RESULT CALLED TO, READ BACK BY AND VERIFIED WITH: RN RENEE TEJEDA ON 10/15/23 @ 1733 BY DRT     Staphylococcus epidermidis NOT DETECTED NOT DETECTED Final    Staphylococcus lugdunensis NOT DETECTED NOT DETECTED Final   Streptococcus species NOT DETECTED NOT DETECTED Final   Streptococcus agalactiae NOT DETECTED NOT DETECTED Final   Streptococcus pneumoniae NOT DETECTED NOT DETECTED Final   Streptococcus pyogenes NOT DETECTED NOT DETECTED Final   A.calcoaceticus-baumannii NOT DETECTED NOT DETECTED Final   Bacteroides fragilis NOT DETECTED NOT DETECTED Final   Enterobacterales NOT DETECTED NOT DETECTED Final   Enterobacter cloacae complex NOT DETECTED NOT DETECTED Final   Escherichia coli NOT DETECTED NOT DETECTED Final   Klebsiella aerogenes NOT DETECTED NOT DETECTED Final   Klebsiella oxytoca NOT DETECTED NOT DETECTED Final   Klebsiella pneumoniae NOT DETECTED NOT DETECTED Final   Proteus species NOT DETECTED NOT DETECTED Final   Salmonella species NOT DETECTED NOT DETECTED Final   Serratia marcescens NOT DETECTED NOT DETECTED Final   Haemophilus influenzae NOT DETECTED NOT DETECTED Final   Neisseria meningitidis NOT DETECTED NOT DETECTED Final   Pseudomonas aeruginosa NOT DETECTED NOT DETECTED Final   Stenotrophomonas maltophilia NOT DETECTED NOT DETECTED Final   Candida albicans NOT DETECTED NOT DETECTED Final   Candida auris NOT DETECTED NOT DETECTED Final   Candida glabrata NOT DETECTED NOT DETECTED Final   Candida krusei NOT DETECTED NOT DETECTED Final   Candida parapsilosis NOT DETECTED NOT DETECTED Final   Candida tropicalis NOT DETECTED NOT DETECTED Final   Cryptococcus neoformans/gattii NOT DETECTED NOT DETECTED Final   Meth resistant mecA/C and MREJ NOT DETECTED NOT DETECTED Final    Comment: Performed at [facility], [address], [place], [place] [address]  Blood culture (routine x 2)     Status: Abnormal   Collection Time: 10/14/23 11:06 PM   Specimen: BLOOD  Result Value Ref Range Status   Specimen Description   Final    BLOOD BLOOD RIGHT ARM Performed at [facility], [address], [place], [place] [address]     Special Requests   Final  BOTTLES DRAWN AEROBIC AND ANAEROBIC Blood Culture adequate volume Performed at [facility], [address], [place], [place] [address]    Culture  Setup Time   Final    GRAM POSITIVE COCCI IN BOTH AEROBIC AND ANAEROBIC BOTTLES Gram Stain Report Called to,Read Back By and Verified With: Wilburt Finlay, RN AT 1123 10/15/23 BY A. SNYDER    Culture (A)  Final    STAPHYLOCOCCUS AUREUS SUSCEPTIBILITIES PERFORMED ON PREVIOUS CULTURE WITHIN THE LAST 5 DAYS. Performed at [facility], [address], [place], [place] [address]    Report Status 10/17/2023 FINAL  Final  Culture, blood (single) w Reflex to ID Panel     Status: None   Collection Time: 10/15/23  5:41 AM   Specimen: BLOOD  Result Value Ref Range Status   Specimen Description BLOOD BLOOD RIGHT HAND  Final   Special Requests   Final    BOTTLES DRAWN AEROBIC ONLY Blood Culture adequate volume   Culture   Final    NO GROWTH 5 DAYS Performed at [facility], [address], [place], [place] [address]    Report Status 10/20/2023 FINAL  Final  Culture, blood (Routine X 2) w Reflex to ID Panel     Status: None   Collection Time: 10/16/23  4:08 AM   Specimen: BLOOD  Result Value Ref Range Status   Specimen Description BLOOD BLOOD LEFT HAND  Final   Special Requests   Final    BOTTLES DRAWN AEROBIC AND ANAEROBIC Blood Culture adequate volume   Culture   Final    NO GROWTH 5 DAYS Performed at [facility], [address], [place], [place] [address]    Report Status 10/21/2023 FINAL  Final  Culture, blood (Routine X 2) w Reflex to ID Panel     Status: None   Collection Time: 10/16/23  4:08 AM   Specimen: BLOOD  Result Value Ref Range Status   Specimen Description BLOOD BLOOD LEFT WRIST  Final   Special Requests   Final    BOTTLES DRAWN AEROBIC AND ANAEROBIC Blood Culture adequate volume   Culture   Final    NO GROWTH 5 DAYS Performed at [facility], [address], [place], [place] [address]     Report Status 10/21/2023 FINAL  Final  MRSA Next Gen by PCR, Nasal     Status: None   Collection Time: 10/19/23  4:03 PM   Specimen: Nasal Mucosa; Nasal Swab  Result Value Ref Range Status   MRSA by PCR Next Gen NOT DETECTED NOT DETECTED Final    Comment: (NOTE) The GeneXpert MRSA Assay (FDA approved for NASAL specimens only), is one component of a comprehensive MRSA colonization surveillance program. It is not intended to diagnose MRSA infection nor to guide or monitor treatment for MRSA infections. Test performance is not FDA approved in patients less than 57 years old. Performed at [facility], [address], [place], [place] [address]   Body fluid culture w Gram Stain     Status: None (Preliminary result)   Collection Time: 10/21/23 11:22 AM   Specimen: Lung, Right; Pleural Fluid  Result Value Ref Range Status   Specimen Description PLEURAL  Final   Special Requests NONE  Final   Gram Stain NO WBC SEEN NO ORGANISMS SEEN   Final   Culture   Final    RARE STAPHYLOCOCCUS AUREUS CULTURE REINCUBATED FOR BETTER GROWTH CRITICAL RESULT CALLED TO, READ BACK BY AND VERIFIED WITH: RN L.YANG AT 1305  ON 10/22/2023 BY T.SAAD. Performed at [facility], [address], [place], [place] [address]    Report Status PENDING  Incomplete         Radiology Studies: IR THORACENTESIS ASP PLEURAL SPACE W/IMG GUIDE Result Date: 10/21/2023 INDICATION: 33 year old female. Presented to ED with right-sided chest pain and shortness of breath. Found to have a right-sided pleural effusion. Request is for therapeutic and diagnostic right-sided thoracentesis EXAM: ULTRASOUND GUIDED RIGHT-SIDED THERAPEUTIC AND DIAGNOSTIC THORACENTESIS MEDICATIONS: Lidocaine 1% 10 mL COMPLICATIONS: None immediate. PROCEDURE: An ultrasound guided thoracentesis was thoroughly discussed with the patient and questions answered. The benefits, risks, alternatives and complications were also discussed. The patient understands  and wishes to proceed with the procedure. Written consent was obtained. Ultrasound was performed to localize and mark an adequate pocket of fluid in the right chest. The area was then prepped and draped in the normal sterile fashion. 1% Lidocaine was used for local anesthesia. Under ultrasound guidance a 6 Fr Safe-T-Centesis catheter was introduced. Thoracentesis was performed. The catheter was removed and a dressing applied. FINDINGS: A total of approximately 300 mL of amber fluid was removed. Samples were sent to the laboratory as requested by the clinical team. IMPRESSION: Successful ultrasound guided therapeutic and diagnostic right-sided thoracentesis yielding 300 mL of pleural fluid. Performed by Anders Grant NP Electronically Signed   By: Judie Petit.  Shick M.D.   On: 10/21/2023 13:07   DG Chest 1 View Result Date: 10/21/2023 CLINICAL DATA:  Status post right-sided thoracentesis EXAM: PORTABLE CHEST 1 VIEW COMPARISON:  10/19/2023 FINDINGS: Cardiac shadow is within normal limits. Left lung is clear. Small persistent right-sided effusion is noted decreased when compared with the prior CT examination. No pneumothorax is identified. Postsurgical changes are noted at the thoracic inlet. IMPRESSION: No evidence of pneumothorax following right-sided thoracentesis. Electronically Signed   By: Alcide Clever M.D.   On: 10/21/2023 11:49        Scheduled Meds:  acetaminophen  1,000 mg Oral Q6H   enoxaparin (LOVENOX) injection  40 mg Subcutaneous Q24H   gabapentin  300 mg Oral TID   lidocaine  1 patch Transdermal Q24H   methadone  40 mg Oral Daily   nicotine  21 mg Transdermal Daily   pantoprazole  40 mg Oral Daily   sodium chloride flush  3 mL Intravenous Q12H   Continuous Infusions:  ampicillin-sulbactam (UNASYN) IV Stopped (10/22/23 1653)     LOS: 7 days    Time spent: 40 minutes    Ramiro Harvest, MD [facility]   To contact the attending provider between 7A-7P or the covering  provider during after hours 7P-7A, please log into the web site [email or web address] and access using universal [place] password for that web site. If you do not have the password, please call the hospital operator.  10/22/2023, 6:57 PM

## 2023-10-23 ENCOUNTER — Inpatient Hospital Stay (HOSPITAL_COMMUNITY): Payer: MEDICAID

## 2023-10-23 DIAGNOSIS — J15211 Pneumonia due to Methicillin susceptible Staphylococcus aureus: Secondary | ICD-10-CM | POA: Diagnosis not present

## 2023-10-23 DIAGNOSIS — J189 Pneumonia, unspecified organism: Secondary | ICD-10-CM | POA: Diagnosis not present

## 2023-10-23 DIAGNOSIS — J90 Pleural effusion, not elsewhere classified: Secondary | ICD-10-CM | POA: Diagnosis not present

## 2023-10-23 DIAGNOSIS — F1910 Other psychoactive substance abuse, uncomplicated: Secondary | ICD-10-CM | POA: Diagnosis not present

## 2023-10-23 LAB — CHOLESTEROL, BODY FLUID: Cholesterol, Fluid: 73 mg/dL

## 2023-10-23 LAB — CBC WITH DIFFERENTIAL/PLATELET
Abs Immature Granulocytes: 0.07 10*3/uL (ref 0.00–0.07)
Basophils Absolute: 0 10*3/uL (ref 0.0–0.1)
Basophils Relative: 0 %
Eosinophils Absolute: 0.2 10*3/uL (ref 0.0–0.5)
Eosinophils Relative: 2 %
HCT: 33.3 % — ABNORMAL LOW (ref 36.0–46.0)
Hemoglobin: 10.9 g/dL — ABNORMAL LOW (ref 12.0–15.0)
Immature Granulocytes: 1 %
Lymphocytes Relative: 25 %
Lymphs Abs: 2.9 10*3/uL (ref 0.7–4.0)
MCH: 29.3 pg (ref 26.0–34.0)
MCHC: 32.7 g/dL (ref 30.0–36.0)
MCV: 89.5 fL (ref 80.0–100.0)
Monocytes Absolute: 0.6 10*3/uL (ref 0.1–1.0)
Monocytes Relative: 6 %
Neutro Abs: 7.8 10*3/uL — ABNORMAL HIGH (ref 1.7–7.7)
Neutrophils Relative %: 66 %
Platelets: 479 10*3/uL — ABNORMAL HIGH (ref 150–400)
RBC: 3.72 MIL/uL — ABNORMAL LOW (ref 3.87–5.11)
RDW: 12.7 % (ref 11.5–15.5)
WBC: 11.5 10*3/uL — ABNORMAL HIGH (ref 4.0–10.5)
nRBC: 0 % (ref 0.0–0.2)

## 2023-10-23 LAB — COMPREHENSIVE METABOLIC PANEL
ALT: 13 U/L (ref 0–44)
AST: 17 U/L (ref 15–41)
Albumin: 2.2 g/dL — ABNORMAL LOW (ref 3.5–5.0)
Alkaline Phosphatase: 77 U/L (ref 38–126)
Anion gap: 13 (ref 5–15)
BUN: 10 mg/dL (ref 6–20)
CO2: 19 mmol/L — ABNORMAL LOW (ref 22–32)
Calcium: 8.7 mg/dL — ABNORMAL LOW (ref 8.9–10.3)
Chloride: 106 mmol/L (ref 98–111)
Creatinine, Ser: 0.62 mg/dL (ref 0.44–1.00)
GFR, Estimated: >60 mL/min (ref >60)
Glucose, Bld: 78 mg/dL (ref 70–99)
Potassium: 4.3 mmol/L (ref 3.5–5.1)
Sodium: 138 mmol/L (ref 135–145)
Total Bilirubin: 0.3 mg/dL (ref 0.0–1.2)
Total Protein: 7.5 g/dL (ref 6.5–8.1)

## 2023-10-23 LAB — ACID FAST SMEAR (AFB, MYCOBACTERIA): Acid Fast Smear: NEGATIVE

## 2023-10-23 LAB — MAGNESIUM: Magnesium: 2.1 mg/dL (ref 1.7–2.4)

## 2023-10-23 LAB — PHOSPHORUS: Phosphorus: 4.7 mg/dL — ABNORMAL HIGH (ref 2.5–4.6)

## 2023-10-23 MED ORDER — KETOROLAC TROMETHAMINE 30 MG/ML IJ SOLN
30.0000 mg | Freq: Four times a day (QID) | INTRAMUSCULAR | Status: DC | PRN
  Administered 2023-10-23 – 2023-10-27 (×15): 30 mg via INTRAVENOUS
  Filled 2023-10-23 (×15): qty 1

## 2023-10-23 NOTE — Progress Notes (Signed)
 PROGRESS NOTE    Pam Soto  FAO:[id number] DOB: [birth year] DOA: 10/14/2023 PCP: Mechele Claude, MD    No chief complaint on file.   Brief Narrative:  Pam Soto is 33 y.o. female with history of IV drug abuse who presents with right-sided chest pain and shortness of breath.  Patient endorses muscle cramps over the past few days which she believes are secondary to depleted electrolytes.  She reports her pleuritic chest pain has become severe and thus presented to the hospital.  She reports she has been trying to abstain from illicit substances and has been taking methadone.  She was concerned that her musculoskeletal pain was a result of withdrawal and she injected fentanyl yesterday.  On arrival to the ED patient was febrile, saturating the mid 90s on room air, tachypneic, tachycardic.  Labs reveal hyponatremia to 129, hypokalemia to 2.9, hypomagnesemia 1.6, leukocytosis 12.4 K.  Respiratory viral panel was negative.  Chest x-ray concerning for multifocal pneumonia.  CTA concerning for multifocal infiltrates which may be septic emboli.  Blood cultures were collected, the patient was started on IV fluids.    Assessment & Plan:   Principal Problem:   Pneumonia Active Problems:   Hyponatremia   Hypokalemia   Hypomagnesemia   Prolonged QT interval   IV drug abuse (HCC)   Multifocal pneumonia   Gram-positive cocci bacteremia   Empyema lung (HCC)   Sepsis due to methicillin susceptible Staphylococcus aureus (HCC)   MSSA bacteremia   #1 loculated right-sided pleural effusion/empyema/multifocal pneumonia -Patient noted to have some shortness of breath, ongoing pleuritic chest pain, repeat CT chest done concerning for pleural effusion and worsening multilobar pneumonia. -Patient seen in consultation by PCCM underwent therapeutic and diagnostic thoracentesis 10/21/2023 per IR with 300 cc of amber-colored fluid extracted. -Preliminary cultures with rare Staphylococcus aureus. -By lights  criteria exudative process. -Patient with MSSA bacteremia in the setting of IV drug use. -Pulmonary following and recommending repeat chest x-ray today, 10/23/2023 with stable right pleural effusion with associated atelectasis.  -Patient with ongoing right pleuritic chest pain.  -Continue empiric IV antibiotics of Unasyn. -May need chest tube placement. -CT chest ordered per PCCM/pulmonary. -ID and pulmonary following and appreciate input and recommendations.  2.  MSSA bacteremia/multifocal pneumonia -Patient on admission noted to have multifocal infiltrates noted in right lower lobe on initial CT angiogram chest with concerns for septic emboli. -Repeat CT revealed worsening pleural effusion. -3/4 blood cultures positive for MSSA bacteremia. -Repeat cultures from 10/16/2023 negative to date. -2D echo and TEE negative for vegetation/endocarditis. 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 -Patient noted will need cefazolin for 4 weeks from 3/5 with EOT on/11/11/2023 followed by dose of oritavancin/dalbavancin on 11/12/2023 to complete a 6-week course of antibiotic treatment. -Due to current effusion/?  Empyema patient currently on IV Unasyn. -Due to patient's active IV drug use she is not a candidate for PICC therapy and will need to remain in the hospital in supervised setting for duration of treatment.  Patient noted to be amenable to this. -Continue incentive spirometry, flutter valve. -ID following and appreciate input and recommendations.  3.  Hep B nonimmune -Consider vaccination once acute illness has resolved.  4.  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 -Will need outpatient treatment. -Will defer to ID.  5.  Sepsis -Patient met criteria for sepsis on arrival with leukocytosis, tachycardia, tachypnea, likely secondary to  pneumonia, bacteremia, probable  empyema. -Continue current empiric IV antibiotics as noted above.  6.  Normocytic anemia -Patient with no overt bleeding. -Hemoglobin stable at 10.9. -Check an anemia panel. -Follow H&H. -Transfusion threshold hemoglobin < 7.  7.  Polysubstance abuse/IV drug abuse -Patient noted to have almost daily use of heroin/fentanyl, cocaine. -Occasional use of benzodiazepines though not consistently.  Patient denies alcohol use. -Patient noted to be working towards abstinence and on methadone in outpatient setting. -Patient noted to use IV fentanyl the day prior to arrival. -Patient desires continued abstinence and would like medication assisted withdrawal while admitted admitted. -Continue home regimen methadone. -TOC consulted for resource assistance.  8.  Right-sided pleuritis -Likely secondary to problem #1. -Continue current regimen of methadone, gabapentin, Toradol, Tylenol, lidocaine patches, as needed oxycodone. -Continue treatment as improvement #1.  9.  Hypokalemia/hypomagnesemia -Repleted.   -Potassium of 4.3, magnesium at 2.1.   10.  Prolonged QT -Optimize electrolytes, avoid QT prolongation medications. -Repeat EKG 3/5 showed resolution of QT prolongation.  11.  Hyponatremia -Resolved.  12.  AKI, POA -Resolved.      DVT prophylaxis: Lovenox Code Status: Full Family Communication: Updated patient.  No family at bedside. Disposition: Home once IV antibiotics are completed and cleared by pulmonary and ID.  Status is: Inpatient Remains inpatient appropriate because: Severity of illness, need for IV antibiotics will need until/08/2023, not a candidate for PICC therapy.  Will need to remain in-house until at least IV antibiotics are completed.   Consultants:  PCCM/pulmonary: Dr. Isaiah Serge 10/21/2023 ID: Dr. Elinor Parkinson 10/15/2023  Procedures:  CT angiogram chest 10/14/2023 CT chest with contrast 10/19/2023 Chest x-ray 10/14/2023, 10/21/2023, 10/23/2023 2D echo 10/15/2023 TEE  10/17/2023 Ultrasound-guided diagnostic and therapeutic right-sided thoracentesis yielding 300 cc of amber-colored fluid: Per IR: Anders Grant, NP 10/21/2023  Antimicrobials:  Anti-infectives (From admission, onward)    Start     Dose/Rate Route Frequency Ordered Stop   10/19/23 1700  Ampicillin-Sulbactam (UNASYN) 3 g in sodium chloride 0.9 % 100 mL IVPB        3 g 200 mL/hr over 30 Minutes Intravenous Every 6 hours 10/19/23 1609     10/16/23 0915  ceFAZolin (ANCEF) IVPB 2g/100 mL premix  Status:  Discontinued        2 g 200 mL/hr over 30 Minutes Intravenous Every 8 hours 10/16/23 0826 10/19/23 1610   10/15/23 1000  vancomycin (VANCOCIN) IVPB 1000 mg/200 mL premix  Status:  Discontinued        1,000 mg 200 mL/hr over 60 Minutes Intravenous Every 12 hours 10/15/23 0406 10/16/23 0826   10/15/23 0600  cefTRIAXone (ROCEPHIN) 2 g in sodium chloride 0.9 % 100 mL IVPB  Status:  Discontinued        2 g 200 mL/hr over 30 Minutes Intravenous Every 24 hours 10/15/23 0354 10/16/23 0826   10/15/23 0400  doxycycline (VIBRAMYCIN) 100 mg in sodium chloride 0.9 % 250 mL IVPB  Status:  Discontinued        100 mg 125 mL/hr over 120 Minutes Intravenous Every 12 hours 10/15/23 0354 10/16/23 0826   10/14/23 2315  ceFEPIme (MAXIPIME) 2 g in sodium chloride 0.9 % 100 mL IVPB        2 g 200 mL/hr over 30 Minutes Intravenous  Once 10/14/23 2303 10/15/23 0109   10/14/23 2315  Vancomycin (VANCOCIN) 1,500 mg in sodium chloride 0.9 % 500 mL IVPB        1,500 mg 250 mL/hr over 120 Minutes Intravenous  Once  10/14/23 2304 10/15/23 0228         Subjective: Sitting up in bed.  Complaining of right-sided pleuritic chest pain.  Complaining of shortness of breath on minimal exertion.  Feels pain is not well-controlled at this time.  Stated had difficulty sleeping last night due to pain.  Noted chest x-ray was done this morning.  Stated labs were obtained 2 hours ago.   Objective: Vitals:   10/22/23 1406 10/22/23  1900 10/23/23 0445 10/23/23 0730  BP: 111/71 132/76 112/63 115/60  Pulse: 93 87 96 91  Resp: 18 18 19    Temp: 98.1 F (36.7 C) 97.9 F (36.6 C) 98.4 F (36.9 C) 98 F (36.7 C)  TempSrc:  Oral Oral Axillary  SpO2: 98% 98%  97%  Weight:      Height:        Intake/Output Summary (Last 24 hours) at 10/23/2023 1302 Last data filed at 10/23/2023 0200 Gross per 24 hour  Intake 1420 ml  Output --  Net 1420 ml   Filed Weights   10/14/23 2300 10/17/23 1151  Weight: 81.6 kg 81.6 kg    Examination:  General exam: Appears calm and comfortable  Respiratory system: Decreased breath sounds in the right base.  Shallow breaths.  No significant wheezing.  No significant rhonchi.  Cardiovascular system: RRR no murmurs rubs or gallops.  No JVD.  No pitting lower extremity edema. Gastrointestinal system: Abdomen is soft, nontender, nondistended, positive bowel sounds.  No rebound.  No guarding.  Central nervous system: Alert and oriented. No focal neurological deficits. Extremities: Symmetric 5 x 5 power. Skin: No rashes, lesions or ulcers Psychiatry: Judgement and insight appear normal. Mood & affect appropriate.     Data Reviewed: I have personally reviewed following labs and imaging studies  CBC: Recent Labs  Lab 10/19/23 0612 10/20/23 0326 10/20/23 1855 10/21/23 0626 10/22/23 0536 10/23/23 1137  WBC 8.1 8.2 9.8 7.7 9.3 11.5*  NEUTROABS 4.3 5.5  --  4.3 6.1 7.8*  HGB 9.1* 10.1* 10.4* 9.6* 9.7* 10.9*  HCT 27.8* 31.3* 31.7* 28.7* 29.9* 33.3*  MCV 89.4 89.7 88.3 87.5 89.3 89.5  PLT 326 400 417* 420* 439* 479*    Basic Metabolic Panel: Recent Labs  Lab 10/17/23 0316 10/18/23 0505 10/19/23 0612 10/20/23 0326 10/21/23 0626 10/22/23 0536  NA 137 139 138 136 137 135  K 3.5 3.5 3.6 4.0 4.0 4.1  CL 107 106 103 103 106 105  CO2 24 26 25 23 24 23   GLUCOSE 96 78 94 88 95 106*  BUN 6 8 8 8 11 13   CREATININE 0.55 0.66 0.56 0.58 0.62 0.79  CALCIUM 7.8* 8.3* 8.4* 8.6* 8.3* 8.3*   MG 1.7 2.0 1.9 1.9 2.0 2.1  PHOS 3.3 4.5 4.7* 4.8*  --   --     GFR: Estimated Creatinine Clearance: 113 mL/min (by C-G formula based on SCr of 0.79 mg/dL).  Liver Function Tests: Recent Labs  Lab 10/18/23 0505 10/19/23 0612 10/20/23 0326 10/21/23 0626 10/22/23 0536  AST 20 17 19 15  14*  ALT 15 12 11 10 10   ALKPHOS 83 68 67 61 66  BILITOT 0.2 <0.2 0.3 0.6 0.3  PROT 6.9 6.4* 6.6 6.1* 6.2*  ALBUMIN 2.6* 2.2* 2.2* 1.9* 1.9*    CBG: No results for input(s): "GLUCAP" in the last 168 hours.   Recent Results (from the past 240 hours)  Resp panel by RT-PCR (RSV, Flu A&B, Covid) Anterior Nasal Swab     Status: None  Collection Time: 10/14/23  8:56 PM   Specimen: Anterior Nasal Swab  Result Value Ref Range Status   SARS Coronavirus 2 by RT PCR NEGATIVE NEGATIVE Final    Comment: (NOTE) SARS-CoV-2 target nucleic acids are NOT DETECTED.  The SARS-CoV-2 RNA is generally detectable in upper respiratory specimens during the acute phase of infection. The lowest concentration of SARS-CoV-2 viral copies this assay can detect is 138 copies/mL. A negative result does not preclude SARS-Cov-2 infection and should not be used as the sole basis for treatment or other patient management decisions. A negative result may occur with  improper specimen collection/handling, submission of specimen other than nasopharyngeal swab, presence of viral mutation(s) within the areas targeted by this assay, and inadequate number of viral copies(<138 copies/mL). A negative result must be combined with clinical observations, patient history, and epidemiological information. The expected result is Negative.  Fact Sheet for Patients:  BloggerCourse.com  Fact Sheet for Healthcare Providers:  SeriousBroker.it  This test is no t yet approved or cleared by the [place] FDA and  has been authorized for detection and/or diagnosis of SARS-CoV-2 by FDA  under an Emergency Use Authorization (EUA). This EUA will remain  in effect (meaning this test can be used) for the duration of the COVID-19 declaration under Section 564(b)(1) of the Act, 21 U.S.C.section 360bbb-3(b)(1), unless the authorization is terminated  or revoked sooner.       Influenza A by PCR NEGATIVE NEGATIVE Final   Influenza B by PCR NEGATIVE NEGATIVE Final    Comment: (NOTE) The Xpert Xpress SARS-CoV-2/FLU/RSV plus assay is intended as an aid in the diagnosis of influenza from Nasopharyngeal swab specimens and should not be used as a sole basis for treatment. Nasal washings and aspirates are unacceptable for Xpert Xpress SARS-CoV-2/FLU/RSV testing.  Fact Sheet for Patients: BloggerCourse.com  Fact Sheet for Healthcare Providers: SeriousBroker.it  This test is not yet approved or cleared by the [place] FDA and has been authorized for detection and/or diagnosis of SARS-CoV-2 by FDA under an Emergency Use Authorization (EUA). This EUA will remain in effect (meaning this test can be used) for the duration of the COVID-19 declaration under Section 564(b)(1) of the Act, 21 U.S.C. section 360bbb-3(b)(1), unless the authorization is terminated or revoked.     Resp Syncytial Virus by PCR NEGATIVE NEGATIVE Final    Comment: (NOTE) Fact Sheet for Patients: BloggerCourse.com  Fact Sheet for Healthcare Providers: SeriousBroker.it  This test is not yet approved or cleared by the [place] FDA and has been authorized for detection and/or diagnosis of SARS-CoV-2 by FDA under an Emergency Use Authorization (EUA). This EUA will remain in effect (meaning this test can be used) for the duration of the COVID-19 declaration under Section 564(b)(1) of the Act, 21 U.S.C. section 360bbb-3(b)(1), unless the authorization is terminated or revoked.  Performed at [facility], [address] [place], [place] [address]   Blood culture (routine x 2)     Status: Abnormal   Collection Time: 10/14/23 11:03 PM   Specimen: BLOOD  Result Value Ref Range Status   Specimen Description   Final    BLOOD BLOOD RIGHT ARM Performed at [facility], [address] [place], [place] [address]    Special Requests   Final    BOTTLES DRAWN AEROBIC AND ANAEROBIC Blood Culture adequate volume Performed at [facility], [address], [place], [place] [address]    Culture  Setup Time   Final    [facility] POSITIVE COCCI  ANAEROBIC BOTTLE ONLY Gram Stain Report Called to,Read Back By and Verified With: Wilburt Finlay, RN AT 1123 10/15/23 BY A. SNYDER AEROBIC BOTTLE ONLY GRAM POSITIVE COCCI GRAM STAIN REVIEWED-AGREE WITH RESULT CRITICAL RESULT CALLED TO, READ BACK BY AND VERIFIED WITH: RN RENEE TEJEDA ON 10/15/23 @ 1733 BY DRT  Performed at [facility], [address], [place], [place] [address]    Culture STAPHYLOCOCCUS AUREUS (A)  Final   Report Status 10/17/2023 FINAL  Final   Organism ID, Bacteria STAPHYLOCOCCUS AUREUS  Final      Susceptibility   Staphylococcus aureus - MIC*    CIPROFLOXACIN >=8 RESISTANT Resistant     ERYTHROMYCIN >=8 RESISTANT Resistant     GENTAMICIN <=0.5 SENSITIVE Sensitive     OXACILLIN 0.5 SENSITIVE Sensitive     TETRACYCLINE <=1 SENSITIVE Sensitive     VANCOMYCIN <=0.5 SENSITIVE Sensitive     TRIMETH/SULFA >=320 RESISTANT Resistant     CLINDAMYCIN <=0.25 SENSITIVE Sensitive     RIFAMPIN <=0.5 SENSITIVE Sensitive     Inducible Clindamycin NEGATIVE Sensitive     LINEZOLID 2 SENSITIVE Sensitive     * STAPHYLOCOCCUS AUREUS  Blood Culture ID Panel (Reflexed)     Status: Abnormal   Collection Time: 10/14/23 11:03 PM  Result Value Ref Range Status   Enterococcus faecalis NOT DETECTED NOT DETECTED Final   Enterococcus Faecium NOT DETECTED NOT DETECTED Final   Listeria monocytogenes NOT DETECTED NOT DETECTED Final   Staphylococcus species DETECTED  (A) NOT DETECTED Final    Comment: CRITICAL RESULT CALLED TO, READ BACK BY AND VERIFIED WITH: RN RENEE TEJEDA ON 10/15/23 @ 1733 BY DRT     Staphylococcus aureus (BCID) DETECTED (A) NOT DETECTED Final    Comment: CRITICAL RESULT CALLED TO, READ BACK BY AND VERIFIED WITH: RN RENEE TEJEDA ON 10/15/23 @ 1733 BY DRT     Staphylococcus epidermidis NOT DETECTED NOT DETECTED Final   Staphylococcus lugdunensis NOT DETECTED NOT DETECTED Final   Streptococcus species NOT DETECTED NOT DETECTED Final   Streptococcus agalactiae NOT DETECTED NOT DETECTED Final   Streptococcus pneumoniae NOT DETECTED NOT DETECTED Final   Streptococcus pyogenes NOT DETECTED NOT DETECTED Final   A.calcoaceticus-baumannii NOT DETECTED NOT DETECTED Final   Bacteroides fragilis NOT DETECTED NOT DETECTED Final   Enterobacterales NOT DETECTED NOT DETECTED Final   Enterobacter cloacae complex NOT DETECTED NOT DETECTED Final   Escherichia coli NOT DETECTED NOT DETECTED Final   Klebsiella aerogenes NOT DETECTED NOT DETECTED Final   Klebsiella oxytoca NOT DETECTED NOT DETECTED Final   Klebsiella pneumoniae NOT DETECTED NOT DETECTED Final   Proteus species NOT DETECTED NOT DETECTED Final   Salmonella species NOT DETECTED NOT DETECTED Final   Serratia marcescens NOT DETECTED NOT DETECTED Final   Haemophilus influenzae NOT DETECTED NOT DETECTED Final   Neisseria meningitidis NOT DETECTED NOT DETECTED Final   Pseudomonas aeruginosa NOT DETECTED NOT DETECTED Final   Stenotrophomonas maltophilia NOT DETECTED NOT DETECTED Final   Candida albicans NOT DETECTED NOT DETECTED Final   Candida auris NOT DETECTED NOT DETECTED Final   Candida glabrata NOT DETECTED NOT DETECTED Final   Candida krusei NOT DETECTED NOT DETECTED Final   Candida parapsilosis NOT DETECTED NOT DETECTED Final   Candida tropicalis NOT DETECTED NOT DETECTED Final   Cryptococcus neoformans/gattii NOT DETECTED NOT DETECTED Final   Meth resistant mecA/C and MREJ NOT  DETECTED NOT DETECTED Final    Comment: Performed at [facility], [address], [place], [place]  [address]  Blood culture (routine x 2)     Status: Abnormal   Collection Time: 10/14/23 11:06 PM   Specimen: BLOOD  Result Value Ref Range Status   Specimen Description   Final    BLOOD BLOOD RIGHT ARM Performed at [facility], [address] [place], [place] [address]    Special Requests   Final    BOTTLES DRAWN AEROBIC AND ANAEROBIC Blood Culture adequate volume Performed at [facility], [address], [place], [place] [address]    Culture  Setup Time   Final    GRAM POSITIVE COCCI IN BOTH AEROBIC AND ANAEROBIC BOTTLES Gram Stain Report Called to,Read Back By and Verified With: Wilburt Finlay, RN AT 1123 10/15/23 BY A. SNYDER    Culture (A)  Final    STAPHYLOCOCCUS AUREUS SUSCEPTIBILITIES PERFORMED ON PREVIOUS CULTURE WITHIN THE LAST 5 DAYS. Performed at [facility], [address] [place], [place] [address]    Report Status 10/17/2023 FINAL  Final  Culture, blood (single) w Reflex to ID Panel     Status: None   Collection Time: 10/15/23  5:41 AM   Specimen: BLOOD  Result Value Ref Range Status   Specimen Description BLOOD BLOOD RIGHT HAND  Final   Special Requests   Final    BOTTLES DRAWN AEROBIC ONLY Blood Culture adequate volume   Culture   Final    NO GROWTH 5 DAYS Performed at [facility], [address] [place], [place] [address]    Report Status 10/20/2023 FINAL  Final  Culture, blood (Routine X 2) w Reflex to ID Panel     Status: None   Collection Time: 10/16/23  4:08 AM   Specimen: BLOOD  Result Value Ref Range Status   Specimen Description BLOOD BLOOD LEFT HAND  Final   Special Requests   Final    BOTTLES DRAWN AEROBIC AND ANAEROBIC Blood Culture adequate volume   Culture   Final    NO GROWTH 5 DAYS Performed at [facility], [address] [place], [place] [address]    Report Status 10/21/2023 FINAL  Final  Culture, blood (Routine X 2) w  Reflex to ID Panel     Status: None   Collection Time: 10/16/23  4:08 AM   Specimen: BLOOD  Result Value Ref Range Status   Specimen Description BLOOD BLOOD LEFT WRIST  Final   Special Requests   Final    BOTTLES DRAWN AEROBIC AND ANAEROBIC Blood Culture adequate volume   Culture   Final    NO GROWTH 5 DAYS Performed at [facility], [address], [place], [place] [address]    Report Status 10/21/2023 FINAL  Final  MRSA Next Gen by PCR, Nasal     Status: None   Collection Time: 10/19/23  4:03 PM   Specimen: Nasal Mucosa; Nasal Swab  Result Value Ref Range Status   MRSA by PCR Next Gen NOT DETECTED NOT DETECTED Final    Comment: (NOTE) The GeneXpert MRSA Assay (FDA approved for NASAL specimens only), is one component of a comprehensive MRSA colonization surveillance program. It is not intended to diagnose MRSA infection nor to guide or monitor treatment for MRSA infections. Test performance is not FDA approved in patients less than 50 years old. Performed at [facility], [address] [place], [place] [address]   Acid Fast Smear (AFB)     Status: None   Collection Time: 10/21/23 11:22 AM   Specimen: Lung, Right; Pleural Fluid  Result  Value Ref Range Status   AFB Specimen Processing Direct Inoculation  Final   Acid Fast Smear Negative  Final    Comment: (NOTE) Performed At: [facility] [address] [place], [place] [id number] Jolene Schimke MD QM:[id number]    Source (AFB) PLEURAL  Final    Comment: Performed at [facility], [address], [place], [place] [address]  Body fluid culture w Gram Stain     Status: None (Preliminary result)   Collection Time: 10/21/23 11:22 AM   Specimen: Lung, Right; Pleural Fluid  Result Value Ref Range Status   Specimen Description PLEURAL  Final   Special Requests NONE  Final   Gram Stain NO WBC SEEN NO ORGANISMS SEEN   Final   Culture   Final    RARE STAPHYLOCOCCUS AUREUS SUSCEPTIBILITIES TO FOLLOW CRITICAL RESULT  CALLED TO, READ BACK BY AND VERIFIED WITH: RN L.YANG AT 1305 ON 10/22/2023 BY T.SAAD. Performed at [facility], [address], [place], [place] [address]    Report Status PENDING  Incomplete         Radiology Studies: DG CHEST PORT 1 VIEW Result Date: 10/23/2023 CLINICAL DATA:  Pneumonia. EXAM: PORTABLE CHEST 1 VIEW COMPARISON:  October 21, 2023. FINDINGS: The heart size and mediastinal contours are within normal limits. Left lung is clear. Stable right pleural effusion with associated atelectasis. The visualized skeletal structures are unremarkable. IMPRESSION: Stable right pleural effusion with associated atelectasis. Electronically Signed   By: Lupita Raider M.D.   On: 10/23/2023 10:02        Scheduled Meds:  acetaminophen  1,000 mg Oral Q6H   enoxaparin (LOVENOX) injection  40 mg Subcutaneous Q24H   gabapentin  300 mg Oral TID   lidocaine  1 patch Transdermal Q24H   methadone  40 mg Oral Daily   nicotine  21 mg Transdermal Daily   pantoprazole  40 mg Oral Daily   sodium chloride flush  3 mL Intravenous Q12H   Continuous Infusions:  ampicillin-sulbactam (UNASYN) IV 3 g (10/23/23 1113)     LOS: 8 days    Time spent: 35 minutes    Ramiro Harvest, MD [facility]   To contact the attending provider between 7A-7P or the covering provider during after hours 7P-7A, please log into the web site [email or web address] and access using universal [place] password for that web site. If you do not have the password, please call the hospital operator.  10/23/2023, 1:02 PM

## 2023-10-23 NOTE — Plan of Care (Signed)
  Problem: Education: Goal: Knowledge of General Education information will improve Description: Including pain rating scale, medication(s)/side effects and non-pharmacologic comfort measures Outcome: Progressing   Problem: Nutrition: Goal: Adequate nutrition will be maintained Outcome: Progressing   Problem: Activity: Goal: Risk for activity intolerance will decrease Outcome: Progressing   Problem: Coping: Goal: Level of anxiety will decrease Outcome: Progressing

## 2023-10-23 NOTE — Progress Notes (Signed)
 NAME:  Pam Soto, MRN:  [id number], DOB:  1991-04-02, LOS: 8 ADMISSION DATE:  10/14/2023, CONSULTATION DATE:  3/11 REFERRING MD:  Rennis Chris, CHIEF COMPLAINT:  pleural effusion    History of Present Illness:  33 year old female patient who was initially admitted on 3/4 with chief complaint of right sided chest and back discomfort, with associated shortness of breath also Noting fever and chills.  Reportedly had fever the week prior..  Admitted to the hospital service on arrival febrile, room air sats 90s sodium 129 respiratory viral panel was negative chest x-ray concerning for multifocal pneumonia.  CT concerning for possible septic emboli culture sent started on broad-spectrum antibiotics.  Blood cultures positive for MSSA on 3/4, followed by infectious disease in addition to internal medicine service.  Initial echocardiogram negative for evidence of endocarditis.  TEE also completed this was negative for endocarditis as well.  Over the course of her mission antibiotics have been tailored by infectious disease, transitioned to cefazolin with plan to treat for 4 weeks followed by dose of oritavancin/dalbavancin on 4/2 to complete the 6-week course.  She has had fairly consistent and worsening right sided chest discomfort repeated noncontrasted CT on the ninth showed moderate right-sided effusion with possible element of loculation.  She was subsequently transferred to [facility] for pulmonary evaluation  Pertinent  Medical History  IV drug abuse, posttraumatic stress disorder Significant Hospital Events: Including procedures, antibiotic start and stop dates in addition to other pertinent events   3/4 admitted with right sided chest pain and fever, cultures positive for MSSA started on broad-spectrum antibiotics 3/5 seen by infectious disease for bacteremia 3/6 antibiotics changed to cefazolin per recommendation of infectious disease 3/7 TEE negative for endocarditis 3/9 increased pleuritic discomfort  CT chest small to moderate size right pleural effusion.  Scattered areas on the left side continue to raise concern for septic emboli 3/10 moved to [facility] for pulmonary evaluation 3/11 pulmonary consultation, Underwent thoracentesis by interventional radiology  Interim History / Subjective:   Continues to have right chest pain  Objective   Blood pressure 123/76, pulse 100, temperature 98.5 F (36.9 C), temperature source Oral, resp. rate 18, height 5\' 7"  (1.702 m), weight 81.6 kg, last menstrual period 10/21/2023, SpO2 96%, unknown if currently breastfeeding.        Intake/Output Summary (Last 24 hours) at 10/23/2023 1502 Last data filed at 10/23/2023 0200 Gross per 24 hour  Intake 1420 ml  Output --  Net 1420 ml    Filed Weights   10/14/23 2300 10/17/23 1151  Weight: 81.6 kg 81.6 kg    Examination: Blood pressure 123/76, pulse 100, temperature 98.5 F (36.9 C), temperature source Oral, resp. rate 18, height 5\' 7"  (1.702 m), weight 81.6 kg, last menstrual period 10/21/2023, SpO2 96%, unknown if currently breastfeeding. Gen:      No acute distress HEENT:  EOMI, sclera anicteric Neck:     No masses; no thyromegaly Lungs:    Clear to auscultation bilaterally; normal respiratory effort CV:         Regular rate and rhythm; no murmurs Abd:      + bowel sounds; soft, non-tender; no palpable masses, no distension Ext:    No edema; adequate peripheral perfusion Skin:      Warm and dry; no rash Neuro: alert and oriented x 3 Psych: normal mood and affect   Resolved Hospital Problem list     Assessment & Plan:  Bilateral pneumonia with cavitary changes Right effusion MSSA bacteremia in  the setting of IV drug use No evidence of endocarditis on echocardiogram Pleural fluid cultures are growing staph and continues to have persistent effusion on chest x-ray Will get CT chest to reevaluate.  Will likely end up needing a chest tube vs cardiothoracic eval for complete  drainage Continue antibiotics per ID.  Best Practice (right click and "Reselect all SmartList Selections" daily)   Per primary team  Signature:   [name] MD [facility] See Amion for pager  If no response to pager , please call [phone] until 7pm After 7:00 pm call Elink  [phone] 10/23/2023, 3:02 PM

## 2023-10-23 NOTE — Progress Notes (Signed)
Pt refused labs this am.

## 2023-10-24 ENCOUNTER — Inpatient Hospital Stay (HOSPITAL_COMMUNITY): Payer: MEDICAID

## 2023-10-24 DIAGNOSIS — J15211 Pneumonia due to Methicillin susceptible Staphylococcus aureus: Secondary | ICD-10-CM | POA: Diagnosis not present

## 2023-10-24 DIAGNOSIS — J189 Pneumonia, unspecified organism: Secondary | ICD-10-CM | POA: Diagnosis not present

## 2023-10-24 DIAGNOSIS — F1910 Other psychoactive substance abuse, uncomplicated: Secondary | ICD-10-CM | POA: Diagnosis not present

## 2023-10-24 DIAGNOSIS — J869 Pyothorax without fistula: Secondary | ICD-10-CM | POA: Diagnosis not present

## 2023-10-24 LAB — CBC WITH DIFFERENTIAL/PLATELET
Abs Immature Granulocytes: 0.05 10*3/uL (ref 0.00–0.07)
Basophils Absolute: 0 10*3/uL (ref 0.0–0.1)
Basophils Relative: 0 %
Eosinophils Absolute: 0.1 10*3/uL (ref 0.0–0.5)
Eosinophils Relative: 1 %
HCT: 30.3 % — ABNORMAL LOW (ref 36.0–46.0)
Hemoglobin: 10 g/dL — ABNORMAL LOW (ref 12.0–15.0)
Immature Granulocytes: 1 %
Lymphocytes Relative: 21 %
Lymphs Abs: 2 10*3/uL (ref 0.7–4.0)
MCH: 28.7 pg (ref 26.0–34.0)
MCHC: 33 g/dL (ref 30.0–36.0)
MCV: 87.1 fL (ref 80.0–100.0)
Monocytes Absolute: 0.5 10*3/uL (ref 0.1–1.0)
Monocytes Relative: 6 %
Neutro Abs: 6.9 10*3/uL (ref 1.7–7.7)
Neutrophils Relative %: 71 %
Platelets: 485 10*3/uL — ABNORMAL HIGH (ref 150–400)
RBC: 3.48 MIL/uL — ABNORMAL LOW (ref 3.87–5.11)
RDW: 12.7 % (ref 11.5–15.5)
WBC: 9.6 10*3/uL (ref 4.0–10.5)
nRBC: 0 % (ref 0.0–0.2)

## 2023-10-24 LAB — RENAL FUNCTION PANEL
Albumin: 2 g/dL — ABNORMAL LOW (ref 3.5–5.0)
Anion gap: 9 (ref 5–15)
BUN: 10 mg/dL (ref 6–20)
CO2: 22 mmol/L (ref 22–32)
Calcium: 8.5 mg/dL — ABNORMAL LOW (ref 8.9–10.3)
Chloride: 104 mmol/L (ref 98–111)
Creatinine, Ser: 0.63 mg/dL (ref 0.44–1.00)
GFR, Estimated: >60 mL/min (ref >60)
Glucose, Bld: 93 mg/dL (ref 70–99)
Phosphorus: 4.2 mg/dL (ref 2.5–4.6)
Potassium: 3.8 mmol/L (ref 3.5–5.1)
Sodium: 135 mmol/L (ref 135–145)

## 2023-10-24 LAB — IRON AND TIBC
Iron: 13 ug/dL — ABNORMAL LOW (ref 28–170)
Saturation Ratios: 6 % — ABNORMAL LOW (ref 10.4–31.8)
TIBC: 234 ug/dL — ABNORMAL LOW (ref 250–450)
UIBC: 221 ug/dL

## 2023-10-24 LAB — FOLATE: Folate: 7.5 ng/mL (ref >5.9)

## 2023-10-24 LAB — MAGNESIUM: Magnesium: 1.9 mg/dL (ref 1.7–2.4)

## 2023-10-24 LAB — FERRITIN: Ferritin: 96 ng/mL (ref 11–307)

## 2023-10-24 LAB — VITAMIN B12: Vitamin B-12: 266 pg/mL (ref 180–914)

## 2023-10-24 MED ORDER — CYANOCOBALAMIN 1000 MCG/ML IJ SOLN
1000.0000 ug | Freq: Every day | INTRAMUSCULAR | Status: DC
  Administered 2023-10-24 – 2023-10-30 (×7): 1000 ug via SUBCUTANEOUS
  Filled 2023-10-24 (×7): qty 1

## 2023-10-24 MED ORDER — LORAZEPAM 2 MG/ML IJ SOLN: 2.0000 mg | Freq: Once | INTRAMUSCULAR | Status: AC

## 2023-10-24 MED ORDER — LORAZEPAM 2 MG/ML IJ SOLN
INTRAMUSCULAR | Status: AC
  Administered 2023-10-24: 2 mg via INTRAVENOUS
  Filled 2023-10-24: qty 1

## 2023-10-24 MED ORDER — SODIUM CHLORIDE 0.9% FLUSH
10.0000 mL | Freq: Three times a day (TID) | INTRAVENOUS | Status: DC
  Administered 2023-10-24 – 2023-10-29 (×14): 10 mL via INTRAPLEURAL

## 2023-10-24 MED ORDER — GADOBUTROL 1 MMOL/ML IV SOLN
8.0000 mL | Freq: Once | INTRAVENOUS | Status: AC | PRN
  Administered 2023-10-24: 8 mL via INTRAVENOUS

## 2023-10-24 NOTE — Progress Notes (Addendum)
 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 Pt reported back pain past 3 days. Thoracentesis on 3/11 -MRI T and l spine today no dicsitis /osteo noted  -CT chest showed moderate loculated right hydropneumothorax. Necrotic PNA.  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 Would continue abx. SP chest tube placement today.  -Follow thoracentesis cx form 3/11+ staph aureus(follow sens) -Blood Cx today - Continue 4 weeks of IV unasyn from 3/5(negative blood Cx) EOT 4/1 followed by one dose of Oritavancin/dalbavancin on 4/2 to complete 6 weeks course. ID appt on 4/8 -Not a PICC line candidate  Please engage ID with any new concerns..ID will SO  Evaluation of this patient requires complex antimicrobial therapy evaluation and counseling + isolation needs for disease transmission risk assessment and mitigation     Microbiology:   Antibiotics: Ampicllin     SUBJECTIVE: Resting in bed. Reports back pain Interval: T max 101.8 overnight  Review of Systems: Review of Systems  All other systems reviewed and are negative.    Scheduled Meds:   acetaminophen  1,000 mg Oral Q6H   cyanocobalamin  1,000 mcg Subcutaneous Daily   enoxaparin (LOVENOX) injection  40 mg Subcutaneous Q24H   gabapentin  300 mg Oral TID   lidocaine  1 patch Transdermal Q24H   methadone  40 mg Oral Daily   nicotine  21 mg Transdermal Daily   pantoprazole  40 mg Oral Daily   sodium chloride flush  10 mL Intrapleural Q8H   sodium chloride flush  3 mL Intravenous Q12H   Continuous Infusions:  ampicillin-sulbactam (UNASYN) IV 3 g (10/24/23 2309)   PRN Meds:.guaiFENesin-dextromethorphan, ketorolac, oxyCODONE, senna-docusate, trimethobenzamide Allergies  Allergen Reactions   Tramadol Hives    OBJECTIVE: Vitals:   10/24/23 1615 10/24/23 1642 10/24/23 2003 10/24/23 2323  BP: (!) 128/92 (!) 137/90 124/83 127/80  Pulse: 93 94 85 (!) 104  Resp: 18  18 18   Temp:   98.4 F (36.9 C) 98.4 F (36.9 C)  TempSrc:      SpO2: 98% 97% 96% 95%  Weight:      Height:       Body mass index is 28.19 kg/m.  Physical Exam Constitutional:      Appearance: Normal appearance.  HENT:     Head: Normocephalic and atraumatic.     Right Ear: Tympanic membrane normal.     Left Ear: Tympanic membrane normal.     Nose: Nose normal.     Mouth/Throat:     Mouth: Mucous membranes are moist.  Eyes:  Extraocular Movements: Extraocular movements intact.     Conjunctiva/sclera: Conjunctivae normal.     Pupils: Pupils are equal, round, and reactive to light.  Cardiovascular:     Rate and Rhythm: Normal rate and regular rhythm.     Heart sounds: No murmur heard.    No friction rub. No gallop.  Pulmonary:     Effort: Pulmonary effort is normal.     Breath sounds: Normal breath sounds.  Abdominal:     General: Abdomen is flat.     Palpations: Abdomen is soft.  Skin:    General: Skin is warm and dry.  Neurological:     General: No focal deficit present.     Mental Status: She is alert and oriented to person, place, and time.  Psychiatric:        Mood and Affect:  Mood normal.       Lab Results Lab Results  Component Value Date   WBC 9.6 10/24/2023   HGB 10.0 (L) 10/24/2023   HCT 30.3 (L) 10/24/2023   MCV 87.1 10/24/2023   PLT 485 (H) 10/24/2023    Lab Results  Component Value Date   CREATININE 0.63 10/24/2023   BUN 10 10/24/2023   NA 135 10/24/2023   K 3.8 10/24/2023   CL 104 10/24/2023   CO2 22 10/24/2023    Lab Results  Component Value Date   ALT 13 10/23/2023   AST 17 10/23/2023   ALKPHOS 77 10/23/2023   BILITOT 0.3 10/23/2023        Danelle Earthly, MD [facility] 10/24/2023, 11:45 PM

## 2023-10-24 NOTE — Procedures (Signed)
 Insertion of Chest Tube Procedure Note  Pam Soto  [id number]  [birth year]  Date:10/24/23  Time:5:54 PM    Provider Performing: Cheri Fowler   Procedure: Pleural Catheter Insertion w/ Imaging Guidance (40981)  Indication(s) Effusion  Consent Risks of the procedure as well as the alternatives and risks of each were explained to the patient and/or caregiver.  Consent for the procedure was obtained and is signed in the bedside chart  Anesthesia Topical only with 1% lidocaine    Time Out Verified patient identification, verified procedure, site/side was marked, verified correct patient position, special equipment/implants available, medications/allergies/relevant history reviewed, required imaging and test results available.   Sterile Technique Maximal sterile technique including full sterile barrier drape, hand hygiene, sterile gown, sterile gloves, mask, hair covering, sterile ultrasound probe cover (if used).   Procedure Description Ultrasound used to identify appropriate pleural anatomy for placement and overlying skin marked. Area of placement cleaned and draped in sterile fashion.  A 14 French pigtail pleural catheter was placed into the right pleural space using Seldinger technique. Appropriate return of fluid was obtained.  The tube was connected to atrium and placed on -20 cm H2O wall suction.   Complications/Tolerance None; patient tolerated the procedure well. Chest X-ray is ordered to verify placement.   EBL Minimal  Specimen(s) none

## 2023-10-24 NOTE — Progress Notes (Signed)
 PROGRESS NOTE    Pam Soto  WJX:[id number] DOB: [birth year] DOA: 10/14/2023 PCP: Mechele Claude, MD    No chief complaint on file.   Brief Narrative:  Pam Soto is 33 y.o. female with history of IV drug abuse who presents with right-sided chest pain and shortness of breath.  Patient endorses muscle cramps over the past few days which she believes are secondary to depleted electrolytes.  She reports her pleuritic chest pain has become severe and thus presented to the hospital.  She reports she has been trying to abstain from illicit substances and has been taking methadone.  She was concerned that her musculoskeletal pain was a result of withdrawal and she injected fentanyl yesterday.  On arrival to the ED patient was febrile, saturating the mid 90s on room air, tachypneic, tachycardic.  Labs reveal hyponatremia to 129, hypokalemia to 2.9, hypomagnesemia 1.6, leukocytosis 12.4 K.  Respiratory viral panel was negative.  Chest x-ray concerning for multifocal pneumonia.  CTA concerning for multifocal infiltrates which may be septic emboli.  Blood cultures were collected, the patient was started on IV fluids.    Assessment & Plan:   Principal Problem:   Pneumonia Active Problems:   Hyponatremia   Hypokalemia   Hypomagnesemia   Prolonged QT interval   IV drug abuse (HCC)   Multifocal pneumonia   Gram-positive cocci bacteremia   Empyema lung (HCC)   Sepsis due to methicillin susceptible Staphylococcus aureus (HCC)   MSSA bacteremia   #1 loculated right-sided pleural effusion/empyema/multifocal pneumonia -Patient noted to have some shortness of breath, ongoing pleuritic chest pain, repeat CT chest done concerning for pleural effusion and worsening multilobar pneumonia. -Patient seen in consultation by PCCM underwent therapeutic and diagnostic thoracentesis 10/21/2023 per IR with 300 cc of amber-colored fluid extracted. -Preliminary cultures with rare Staphylococcus aureus. -By lights  criteria exudative process. -Patient with MSSA bacteremia in the setting of IV drug use. -Pulmonary following and recommending repeat chest x-ray today, 10/23/2023 with stable right pleural effusion with associated atelectasis.  -Patient with ongoing right pleuritic chest pain.  -Continue empiric IV antibiotics of Unasyn. -May need chest tube placement. -CT chest ordered per PCCM/pulmonary which showed moderate loculated right hydropneumothorax.  Pneumothorax component is small may be related to recent thoracentesis.  Complete opacification of right lower lobe, suspicious for pneumonia.  Bubbly air peripherally, unclear if this is within pleural fluid or lung parenchyma, raising concern for necrotic pneumonia-intraparenchymal.  Partial opacification of right middle lobe.  Small left pleural effusion is new.  Bandlike atelectasis in the left lower lobe.. -ID and pulmonary following and appreciate input and recommendations.  2.  MSSA bacteremia/multifocal pneumonia -Patient on admission noted to have multifocal infiltrates noted in right lower lobe on initial CT angiogram chest with concerns for septic emboli. -Repeat CT revealed worsening pleural effusion. -3/4 blood cultures positive for MSSA bacteremia. -Repeat cultures from 10/16/2023 negative to date. -2D echo and TEE negative for vegetation/endocarditis. 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 -Patient noted will need cefazolin for 4 weeks from 3/5 with EOT on/11/11/2023 followed by dose of oritavancin/dalbavancin on 11/12/2023 to complete a 6-week course of antibiotic treatment. -Due to current effusion/?  Empyema patient currently on IV Unasyn. -CT chest ordered per PCCM/pulmonary which showed moderate loculated right hydropneumothorax.  Pneumothorax component is small may be related to recent thoracentesis.  Complete  opacification of right lower lobe,  suspicious for pneumonia.  Bubbly air peripherally, unclear if this is within pleural fluid or lung parenchyma, raising concern for necrotic pneumonia-intraparenchymal.  Partial opacification of right middle lobe.  Small left pleural effusion is new.  Bandlike atelectasis in the left lower lobe.. -Due to patient's active IV drug use she is not a candidate for PICC therapy and will need to remain in the hospital in supervised setting for duration of treatment.  Patient noted to be amenable to this. -Continue incentive spirometry, flutter valve. -ID following and appreciate input and recommendations.  3.  Hep B nonimmune -Consider vaccination once acute illness has resolved.  4.  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 -Will need outpatient treatment. -Will defer to ID.  5.  Sepsis -Patient met criteria for sepsis on arrival with leukocytosis, tachycardia, tachypnea, likely secondary to pneumonia, bacteremia, empyema. -Continue current empiric IV antibiotics as noted above.  6.  Normocytic anemia/anemia of chronic disease/low vitamin B12 levels -Patient with no overt bleeding. -Hemoglobin stable at 10.0. -Anemia panel with iron of 13, TIBC of 234, ferritin of 96, folate 7.5, vitamin B12 266. -Due to low vitamin B12 levels will place on vitamin B12 1000 mcg subcu daily while in-house and can transition to oral vitamin B12 on discharge. -Follow H&H. -Transfusion threshold hemoglobin < 7.  7.  Polysubstance abuse/IV drug abuse -Patient noted to have almost daily use of heroin/fentanyl, cocaine. -Occasional use of benzodiazepines though not consistently.  Patient denies alcohol use. -Patient noted to be working towards abstinence and on methadone in outpatient setting. -Patient noted to use IV fentanyl the day prior to arrival. -Patient desires continued abstinence and would like medication assisted withdrawal while admitted admitted. -Continue home regimen  methadone. -TOC consulted for resource assistance.  8.  Right-sided pleuritis -Likely secondary to problem #1. -Pain better controlled today per patient. -Continue current regimen of methadone, gabapentin, Toradol, Tylenol, lidocaine patches, as needed oxycodone. -Continue treatment as improvement #1.  9.  Hypokalemia/hypomagnesemia -Repleted.   -Potassium at 3.8, magnesium at 1.9.   -Repeat labs in the AM.   10.  Prolonged QT -Optimize electrolytes, avoid QT prolongation medications. -Repeat EKG 3/5 showed resolution of QT prolongation.  11.  Hyponatremia -Resolved.  12.  AKI, POA -Resolved.      DVT prophylaxis: Lovenox Code Status: Full Family Communication: Updated patient.  No family at bedside. Disposition: Home once IV antibiotics are completed and cleared by pulmonary and ID.  Status is: Inpatient Remains inpatient appropriate because: Severity of illness, need for IV antibiotics will need until/08/2023, not a candidate for PICC therapy.  Will need to remain in-house until at least IV antibiotics are completed.   Consultants:  PCCM/pulmonary: Dr. Isaiah Serge 10/21/2023 ID: Dr. Elinor Parkinson 10/15/2023  Procedures:  CT angiogram chest 10/14/2023 CT chest with contrast 10/19/2023 Chest x-ray 10/14/2023, 10/21/2023, 10/23/2023 2D echo 10/15/2023 TEE 10/17/2023 Ultrasound-guided diagnostic and therapeutic right-sided thoracentesis yielding 300 cc of amber-colored fluid: Per IR: Anders Grant, NP 10/21/2023 CT chest 10/23/2023  Antimicrobials:  Anti-infectives (From admission, onward)    Start     Dose/Rate Route Frequency Ordered Stop   10/19/23 1700  Ampicillin-Sulbactam (UNASYN) 3 g in sodium chloride 0.9 % 100 mL IVPB        3 g 200 mL/hr over 30 Minutes Intravenous Every 6 hours 10/19/23 1609     10/16/23 0915  ceFAZolin (ANCEF) IVPB 2g/100 mL premix  Status:  Discontinued        2 g 200 mL/hr over 30 Minutes Intravenous Every 8  hours 10/16/23 0826 10/19/23 1610    10/15/23 1000  vancomycin (VANCOCIN) IVPB 1000 mg/200 mL premix  Status:  Discontinued        1,000 mg 200 mL/hr over 60 Minutes Intravenous Every 12 hours 10/15/23 0406 10/16/23 0826   10/15/23 0600  cefTRIAXone (ROCEPHIN) 2 g in sodium chloride 0.9 % 100 mL IVPB  Status:  Discontinued        2 g 200 mL/hr over 30 Minutes Intravenous Every 24 hours 10/15/23 0354 10/16/23 0826   10/15/23 0400  doxycycline (VIBRAMYCIN) 100 mg in sodium chloride 0.9 % 250 mL IVPB  Status:  Discontinued        100 mg 125 mL/hr over 120 Minutes Intravenous Every 12 hours 10/15/23 0354 10/16/23 0826   10/14/23 2315  ceFEPIme (MAXIPIME) 2 g in sodium chloride 0.9 % 100 mL IVPB        2 g 200 mL/hr over 30 Minutes Intravenous  Once 10/14/23 2303 10/15/23 0109   10/14/23 2315  Vancomycin (VANCOCIN) 1,500 mg in sodium chloride 0.9 % 500 mL IVPB        1,500 mg 250 mL/hr over 120 Minutes Intravenous  Once 10/14/23 2304 10/15/23 0228         Subjective: Sitting up in bed.  Feels pleuritic chest pain better controlled on current regimen.  Still with shortness of breath which is slowly improving.  No nausea or vomiting.  Stated was seen by pulmonary who was going to review CT scan findings to determine next steps and as to whether patient needs a chest tube.   Objective: Vitals:   10/23/23 2000 10/24/23 0306 10/24/23 0548 10/24/23 0746  BP: 125/73 (!) 146/94  114/69  Pulse: (!) 101 (!) 121  94  Resp: 18 20  18   Temp: 99.9 F (37.7 C) (!) 101.8 F (38.8 C) 99.3 F (37.4 C) 98.9 F (37.2 C)  TempSrc: Oral Oral  Oral  SpO2: 95% 94%  94%  Weight:      Height:        Intake/Output Summary (Last 24 hours) at 10/24/2023 1222 Last data filed at 10/24/2023 0400 Gross per 24 hour  Intake 360 ml  Output 460 ml  Net -100 ml   Filed Weights   10/14/23 2300 10/17/23 1151  Weight: 81.6 kg 81.6 kg    Examination:  General exam: NAD. Respiratory system: Decreased breath sounds in the right base.  Less tight.   Improved air movement.  No significant wheezing.  No significant rhonchi.  Speaking in full sentences.  No use of accessory muscles of respiration.   Cardiovascular system: Regular rate rhythm no murmurs rubs or gallops.  No JVD.  No pitting lower extremity edema.  Gastrointestinal system: Abdomen soft, nontender, nondistended, positive bowel sounds.  No rebound.  No guarding. Central nervous system: Alert and oriented.  Moving extremities spontaneously.  No focal neurological deficits. Extremities: Symmetric 5 x 5 power. Skin: No rashes, lesions or ulcers Psychiatry: Judgement and insight appear normal. Mood & affect appropriate.     Data Reviewed: I have personally reviewed following labs and imaging studies  CBC: Recent Labs  Lab 10/20/23 0326 10/20/23 1855 10/21/23 0626 10/22/23 0536 10/23/23 1137 10/24/23 0415  WBC 8.2 9.8 7.7 9.3 11.5* 9.6  NEUTROABS 5.5  --  4.3 6.1 7.8* 6.9  HGB 10.1* 10.4* 9.6* 9.7* 10.9* 10.0*  HCT 31.3* 31.7* 28.7* 29.9* 33.3* 30.3*  MCV 89.7 88.3 87.5 89.3 89.5 87.1  PLT 400 417* 420* 439* 479*  485*    Basic Metabolic Panel: Recent Labs  Lab 10/18/23 0505 10/19/23 0612 10/20/23 0326 10/21/23 0626 10/22/23 0536 10/23/23 1137 10/24/23 0415  NA 139 138 136 137 135 138 135  K 3.5 3.6 4.0 4.0 4.1 4.3 3.8  CL 106 103 103 106 105 106 104  CO2 26 25 23 24 23  19* 22  GLUCOSE 78 94 88 95 106* 78 93  BUN 8 8 8 11 13 10 10   CREATININE 0.66 0.56 0.58 0.62 0.79 0.62 0.63  CALCIUM 8.3* 8.4* 8.6* 8.3* 8.3* 8.7* 8.5*  MG 2.0 1.9 1.9 2.0 2.1 2.1 1.9  PHOS 4.5 4.7* 4.8*  --   --  4.7* 4.2    GFR: Estimated Creatinine Clearance: 113 mL/min (by C-G formula based on SCr of 0.63 mg/dL).  Liver Function Tests: Recent Labs  Lab 10/19/23 0612 10/20/23 0326 10/21/23 0626 10/22/23 0536 10/23/23 1137 10/24/23 0415  AST 17 19 15  14* 17  --   ALT 12 11 10 10 13   --   ALKPHOS 68 67 61 66 77  --   BILITOT <0.2 0.3 0.6 0.3 0.3  --   PROT 6.4* 6.6 6.1*  6.2* 7.5  --   ALBUMIN 2.2* 2.2* 1.9* 1.9* 2.2* 2.0*    CBG: No results for input(s): "GLUCAP" in the last 168 hours.   Recent Results (from the past 240 hours)  Resp panel by RT-PCR (RSV, Flu A&B, Covid) Anterior Nasal Swab     Status: None   Collection Time: 10/14/23  8:56 PM   Specimen: Anterior Nasal Swab  Result Value Ref Range Status   SARS Coronavirus 2 by RT PCR NEGATIVE NEGATIVE Final    Comment: (NOTE) SARS-CoV-2 target nucleic acids are NOT DETECTED.  The SARS-CoV-2 RNA is generally detectable in upper respiratory specimens during the acute phase of infection. The lowest concentration of SARS-CoV-2 viral copies this assay can detect is 138 copies/mL. A negative result does not preclude SARS-Cov-2 infection and should not be used as the sole basis for treatment or other patient management decisions. A negative result may occur with  improper specimen collection/handling, submission of specimen other than nasopharyngeal swab, presence of viral mutation(s) within the areas targeted by this assay, and inadequate number of viral copies(<138 copies/mL). A negative result must be combined with clinical observations, patient history, and epidemiological information. The expected result is Negative.  Fact Sheet for Patients:  BloggerCourse.com  Fact Sheet for Healthcare Providers:  SeriousBroker.it  This test is no t yet approved or cleared by the [place] FDA and  has been authorized for detection and/or diagnosis of SARS-CoV-2 by FDA under an Emergency Use Authorization (EUA). This EUA will remain  in effect (meaning this test can be used) for the duration of the COVID-19 declaration under Section 564(b)(1) of the Act, 21 U.S.C.section 360bbb-3(b)(1), unless the authorization is terminated  or revoked sooner.       Influenza A by PCR NEGATIVE NEGATIVE Final   Influenza B by PCR NEGATIVE NEGATIVE Final     Comment: (NOTE) The Xpert Xpress SARS-CoV-2/FLU/RSV plus assay is intended as an aid in the diagnosis of influenza from Nasopharyngeal swab specimens and should not be used as a sole basis for treatment. Nasal washings and aspirates are unacceptable for Xpert Xpress SARS-CoV-2/FLU/RSV testing.  Fact Sheet for Patients: BloggerCourse.com  Fact Sheet for Healthcare Providers: SeriousBroker.it  This test is not yet approved or cleared by the [place] FDA and has been authorized for detection and/or  diagnosis of SARS-CoV-2 by FDA under an Emergency Use Authorization (EUA). This EUA will remain in effect (meaning this test can be used) for the duration of the COVID-19 declaration under Section 564(b)(1) of the Act, 21 U.S.C. section 360bbb-3(b)(1), unless the authorization is terminated or revoked.     Resp Syncytial Virus by PCR NEGATIVE NEGATIVE Final    Comment: (NOTE) Fact Sheet for Patients: BloggerCourse.com  Fact Sheet for Healthcare Providers: SeriousBroker.it  This test is not yet approved or cleared by the [place] FDA and has been authorized for detection and/or diagnosis of SARS-CoV-2 by FDA under an Emergency Use Authorization (EUA). This EUA will remain in effect (meaning this test can be used) for the duration of the COVID-19 declaration under Section 564(b)(1) of the Act, 21 U.S.C. section 360bbb-3(b)(1), unless the authorization is terminated or revoked.  Performed at [facility], [address] [place], [place] [address]   Blood culture (routine x 2)     Status: Abnormal   Collection Time: 10/14/23 11:03 PM   Specimen: BLOOD  Result Value Ref Range Status   Specimen Description   Final    BLOOD BLOOD RIGHT ARM Performed at [facility], [address] [place], [place] [address]    Special Requests   Final    BOTTLES DRAWN AEROBIC AND ANAEROBIC  Blood Culture adequate volume Performed at [facility], [address] [place], [place] [address]    Culture  Setup Time   Final    GRAM POSITIVE COCCI ANAEROBIC BOTTLE ONLY Gram Stain Report Called to,Read Back By and Verified With: Wilburt Finlay, RN AT 1123 10/15/23 BY A. SNYDER AEROBIC BOTTLE ONLY GRAM POSITIVE COCCI GRAM STAIN REVIEWED-AGREE WITH RESULT CRITICAL RESULT CALLED TO, READ BACK BY AND VERIFIED WITH: RN RENEE TEJEDA ON 10/15/23 @ 1733 BY DRT  Performed at [facility], [address] [place], [place] [address]    Culture STAPHYLOCOCCUS AUREUS (A)  Final   Report Status 10/17/2023 FINAL  Final   Organism ID, Bacteria STAPHYLOCOCCUS AUREUS  Final      Susceptibility   Staphylococcus aureus - MIC*    CIPROFLOXACIN >=8 RESISTANT Resistant     ERYTHROMYCIN >=8 RESISTANT Resistant     GENTAMICIN <=0.5 SENSITIVE Sensitive     OXACILLIN 0.5 SENSITIVE Sensitive     TETRACYCLINE <=1 SENSITIVE Sensitive     VANCOMYCIN <=0.5 SENSITIVE Sensitive     TRIMETH/SULFA >=320 RESISTANT Resistant     CLINDAMYCIN <=0.25 SENSITIVE Sensitive     RIFAMPIN <=0.5 SENSITIVE Sensitive     Inducible Clindamycin NEGATIVE Sensitive     LINEZOLID 2 SENSITIVE Sensitive     * STAPHYLOCOCCUS AUREUS  Blood Culture ID Panel (Reflexed)     Status: Abnormal   Collection Time: 10/14/23 11:03 PM  Result Value Ref Range Status   Enterococcus faecalis NOT DETECTED NOT DETECTED Final   Enterococcus Faecium NOT DETECTED NOT DETECTED Final   Listeria monocytogenes NOT DETECTED NOT DETECTED Final   Staphylococcus species DETECTED (A) NOT DETECTED Final    Comment: CRITICAL RESULT CALLED TO, READ BACK BY AND VERIFIED WITH: RN RENEE TEJEDA ON 10/15/23 @ 1733 BY DRT     Staphylococcus aureus (BCID) DETECTED (A) NOT DETECTED Final    Comment: CRITICAL RESULT CALLED TO, READ BACK BY AND VERIFIED WITH: RN RENEE TEJEDA ON 10/15/23 @ 1733 BY DRT     Staphylococcus epidermidis NOT DETECTED NOT DETECTED Final    Staphylococcus lugdunensis NOT DETECTED NOT DETECTED Final   Streptococcus  species NOT DETECTED NOT DETECTED Final   Streptococcus agalactiae NOT DETECTED NOT DETECTED Final   Streptococcus pneumoniae NOT DETECTED NOT DETECTED Final   Streptococcus pyogenes NOT DETECTED NOT DETECTED Final   A.calcoaceticus-baumannii NOT DETECTED NOT DETECTED Final   Bacteroides fragilis NOT DETECTED NOT DETECTED Final   Enterobacterales NOT DETECTED NOT DETECTED Final   Enterobacter cloacae complex NOT DETECTED NOT DETECTED Final   Escherichia coli NOT DETECTED NOT DETECTED Final   Klebsiella aerogenes NOT DETECTED NOT DETECTED Final   Klebsiella oxytoca NOT DETECTED NOT DETECTED Final   Klebsiella pneumoniae NOT DETECTED NOT DETECTED Final   Proteus species NOT DETECTED NOT DETECTED Final   Salmonella species NOT DETECTED NOT DETECTED Final   Serratia marcescens NOT DETECTED NOT DETECTED Final   Haemophilus influenzae NOT DETECTED NOT DETECTED Final   Neisseria meningitidis NOT DETECTED NOT DETECTED Final   Pseudomonas aeruginosa NOT DETECTED NOT DETECTED Final   Stenotrophomonas maltophilia NOT DETECTED NOT DETECTED Final   Candida albicans NOT DETECTED NOT DETECTED Final   Candida auris NOT DETECTED NOT DETECTED Final   Candida glabrata NOT DETECTED NOT DETECTED Final   Candida krusei NOT DETECTED NOT DETECTED Final   Candida parapsilosis NOT DETECTED NOT DETECTED Final   Candida tropicalis NOT DETECTED NOT DETECTED Final   Cryptococcus neoformans/gattii NOT DETECTED NOT DETECTED Final   Meth resistant mecA/C and MREJ NOT DETECTED NOT DETECTED Final    Comment: Performed at [facility], [address], [place], [place] [address]  Blood culture (routine x 2)     Status: Abnormal   Collection Time: 10/14/23 11:06 PM   Specimen: BLOOD  Result Value Ref Range Status   Specimen Description   Final    BLOOD BLOOD RIGHT ARM Performed at [facility], [address] [place], [place] [address]     Special Requests   Final    BOTTLES DRAWN AEROBIC AND ANAEROBIC Blood Culture adequate volume Performed at [facility], [address] [place], [place] [address]    Culture  Setup Time   Final    GRAM POSITIVE COCCI IN BOTH AEROBIC AND ANAEROBIC BOTTLES Gram Stain Report Called to,Read Back By and Verified With: Wilburt Finlay, RN AT 1123 10/15/23 BY A. SNYDER    Culture (A)  Final    STAPHYLOCOCCUS AUREUS SUSCEPTIBILITIES PERFORMED ON PREVIOUS CULTURE WITHIN THE LAST 5 DAYS. Performed at [facility], [address], [place], [place] [address]    Report Status 10/17/2023 FINAL  Final  Culture, blood (single) w Reflex to ID Panel     Status: None   Collection Time: 10/15/23  5:41 AM   Specimen: BLOOD  Result Value Ref Range Status   Specimen Description BLOOD BLOOD RIGHT HAND  Final   Special Requests   Final    BOTTLES DRAWN AEROBIC ONLY Blood Culture adequate volume   Culture   Final    NO GROWTH 5 DAYS Performed at [facility], [address], [place], [place] [address]    Report Status 10/20/2023 FINAL  Final  Culture, blood (Routine X 2) w Reflex to ID Panel     Status: None   Collection Time: 10/16/23  4:08 AM   Specimen: BLOOD  Result Value Ref Range Status   Specimen Description BLOOD BLOOD LEFT HAND  Final   Special Requests   Final    BOTTLES DRAWN AEROBIC AND ANAEROBIC Blood Culture adequate volume   Culture   Final    NO GROWTH 5  DAYS Performed at [facility], [address] [place], [place] [address]    Report Status 10/21/2023 FINAL  Final  Culture, blood (Routine X 2) w Reflex to ID Panel     Status: None   Collection Time: 10/16/23  4:08 AM   Specimen: BLOOD  Result Value Ref Range Status   Specimen Description BLOOD BLOOD LEFT WRIST  Final   Special Requests   Final    BOTTLES DRAWN AEROBIC AND ANAEROBIC Blood Culture adequate volume   Culture   Final    NO GROWTH 5 DAYS Performed at [facility], [address], [place], [place] [address]     Report Status 10/21/2023 FINAL  Final  MRSA Next Gen by PCR, Nasal     Status: None   Collection Time: 10/19/23  4:03 PM   Specimen: Nasal Mucosa; Nasal Swab  Result Value Ref Range Status   MRSA by PCR Next Gen NOT DETECTED NOT DETECTED Final    Comment: (NOTE) The GeneXpert MRSA Assay (FDA approved for NASAL specimens only), is one component of a comprehensive MRSA colonization surveillance program. It is not intended to diagnose MRSA infection nor to guide or monitor treatment for MRSA infections. Test performance is not FDA approved in patients less than 45 years old. Performed at [facility], [address] [place], [place] [address]   Acid Fast Smear (AFB)     Status: None   Collection Time: 10/21/23 11:22 AM   Specimen: Lung, Right; Pleural Fluid  Result Value Ref Range Status   AFB Specimen Processing Direct Inoculation  Final   Acid Fast Smear Negative  Final    Comment: (NOTE) Performed At: [facility] [address] [place], [place] [id number] Jolene Schimke MD OZ:[id number]    Source (AFB) PLEURAL  Final    Comment: Performed at [facility], [address] [place], [place] [address]  Body fluid culture w Gram Stain     Status: None (Preliminary result)   Collection Time: 10/21/23 11:22 AM   Specimen: Lung, Right; Pleural Fluid  Result Value Ref Range Status   Specimen Description PLEURAL  Final   Special Requests NONE  Final   Gram Stain NO WBC SEEN NO ORGANISMS SEEN   Final   Culture   Final    RARE STAPHYLOCOCCUS AUREUS SUSCEPTIBILITIES TO FOLLOW CRITICAL RESULT CALLED TO, READ BACK BY AND VERIFIED WITH: RN L.YANG AT 1305 ON 10/22/2023 BY T.SAAD. CONFIRMATION OF SUSCEPTIBILITIES IN PROGRESS Performed at [facility], [address] [place], [place] [address]    Report Status PENDING  Incomplete         Radiology Studies: CT CHEST WO CONTRAST Result Date: 10/23/2023 CLINICAL DATA:  Pneumonia, complication suspected, xray done  EXAM: CT CHEST WITHOUT CONTRAST TECHNIQUE: Multidetector CT imaging of the chest was performed following the standard protocol without IV contrast. RADIATION DOSE REDUCTION: This exam was performed according to the departmental dose-optimization program which includes automated exposure control, adjustment of the mA and/or kV according to patient size and/or use of iterative reconstruction technique. COMPARISON:  Chest CT 10/19/2023 FINDINGS: Cardiovascular: The heart is normal in size. No pericardial effusion. The thoracic aorta is normal in caliber. Mediastinum/Nodes: Limited assessment for adenopathy in the absence of IV contrast. Small mediastinal and epicardial lymph nodes. Small axillary lymph nodes. Decompressed esophagus. Lungs/Pleura: Moderate loculated right hydropneumothorax. Pneumothorax component is small and visualized at the apex and anterior lung base. Complete opacification of the right lower lobe. Bubbly air  peripherally, unclear if this is within pleural fluid or lung parenchyma. There is partial opacification in the right middle lobe. Small left pleural effusion is new. Bandlike atelectasis in the left lower lobe. Upper Abdomen: No acute findings. Musculoskeletal: There are no acute or suspicious osseous abnormalities. IMPRESSION: 1. Moderate loculated right hydropneumothorax. Pneumothorax component is small, may be related to recent thoracentesis. 2. Complete opacification of the right lower lobe, suspicious for pneumonia. Bubbly air peripherally, unclear if this is within pleural fluid or lung parenchyma, raising concern for necrotic pneumonia if intraparenchymal. 3. Partial opacification of the right middle lobe. 4. Small left pleural effusion is new. Bandlike atelectasis in the left lower lobe. Electronically Signed   By: Narda Rutherford M.D.   On: 10/23/2023 21:05   DG CHEST PORT 1 VIEW Result Date: 10/23/2023 CLINICAL DATA:  Pneumonia. EXAM: PORTABLE CHEST 1 VIEW COMPARISON:  October 21, 2023. FINDINGS: The heart size and mediastinal contours are within normal limits. Left lung is clear. Stable right pleural effusion with associated atelectasis. The visualized skeletal structures are unremarkable. IMPRESSION: Stable right pleural effusion with associated atelectasis. Electronically Signed   By: Lupita Raider M.D.   On: 10/23/2023 10:02        Scheduled Meds:  acetaminophen  1,000 mg Oral Q6H   cyanocobalamin  1,000 mcg Subcutaneous Daily   enoxaparin (LOVENOX) injection  40 mg Subcutaneous Q24H   gabapentin  300 mg Oral TID   lidocaine  1 patch Transdermal Q24H   methadone  40 mg Oral Daily   nicotine  21 mg Transdermal Daily   pantoprazole  40 mg Oral Daily   sodium chloride flush  3 mL Intravenous Q12H   Continuous Infusions:  ampicillin-sulbactam (UNASYN) IV 3 g (10/24/23 1120)     LOS: 9 days    Time spent: 35 minutes    Ramiro Harvest, MD [facility]   To contact the attending provider between 7A-7P or the covering provider during after hours 7P-7A, please log into the web site [email or web address] and access using universal [place] password for that web site. If you do not have the password, please call the hospital operator.  10/24/2023, 12:22 PM

## 2023-10-25 ENCOUNTER — Inpatient Hospital Stay (HOSPITAL_COMMUNITY): Payer: MEDICAID

## 2023-10-25 DIAGNOSIS — E871 Hypo-osmolality and hyponatremia: Secondary | ICD-10-CM | POA: Diagnosis not present

## 2023-10-25 DIAGNOSIS — J850 Gangrene and necrosis of lung: Secondary | ICD-10-CM

## 2023-10-25 DIAGNOSIS — J869 Pyothorax without fistula: Secondary | ICD-10-CM | POA: Diagnosis not present

## 2023-10-25 DIAGNOSIS — R7881 Bacteremia: Secondary | ICD-10-CM | POA: Diagnosis not present

## 2023-10-25 DIAGNOSIS — J15211 Pneumonia due to Methicillin susceptible Staphylococcus aureus: Secondary | ICD-10-CM | POA: Diagnosis not present

## 2023-10-25 LAB — BASIC METABOLIC PANEL
Anion gap: 13 (ref 5–15)
BUN: 11 mg/dL (ref 6–20)
CO2: 20 mmol/L — ABNORMAL LOW (ref 22–32)
Calcium: 8.3 mg/dL — ABNORMAL LOW (ref 8.9–10.3)
Chloride: 103 mmol/L (ref 98–111)
Creatinine, Ser: 0.56 mg/dL (ref 0.44–1.00)
GFR, Estimated: >60 mL/min (ref >60)
Glucose, Bld: 94 mg/dL (ref 70–99)
Potassium: 3.8 mmol/L (ref 3.5–5.1)
Sodium: 136 mmol/L (ref 135–145)

## 2023-10-25 LAB — CBC WITH DIFFERENTIAL/PLATELET
Abs Immature Granulocytes: 0.06 10*3/uL (ref 0.00–0.07)
Basophils Absolute: 0 10*3/uL (ref 0.0–0.1)
Basophils Relative: 0 %
Eosinophils Absolute: 0.2 10*3/uL (ref 0.0–0.5)
Eosinophils Relative: 2 %
HCT: 28.6 % — ABNORMAL LOW (ref 36.0–46.0)
Hemoglobin: 9.3 g/dL — ABNORMAL LOW (ref 12.0–15.0)
Immature Granulocytes: 1 %
Lymphocytes Relative: 28 %
Lymphs Abs: 2.6 10*3/uL (ref 0.7–4.0)
MCH: 29.2 pg (ref 26.0–34.0)
MCHC: 32.5 g/dL (ref 30.0–36.0)
MCV: 89.7 fL (ref 80.0–100.0)
Monocytes Absolute: 0.5 10*3/uL (ref 0.1–1.0)
Monocytes Relative: 6 %
Neutro Abs: 5.9 10*3/uL (ref 1.7–7.7)
Neutrophils Relative %: 63 %
Platelets: 480 10*3/uL — ABNORMAL HIGH (ref 150–400)
RBC: 3.19 MIL/uL — ABNORMAL LOW (ref 3.87–5.11)
RDW: 12.3 % (ref 11.5–15.5)
WBC: 9.2 10*3/uL (ref 4.0–10.5)
nRBC: 0 % (ref 0.0–0.2)

## 2023-10-25 LAB — BODY FLUID CULTURE W GRAM STAIN: Gram Stain: NONE SEEN

## 2023-10-25 LAB — MAGNESIUM: Magnesium: 1.8 mg/dL (ref 1.7–2.4)

## 2023-10-25 MED ORDER — SODIUM CHLORIDE (PF) 0.9 % IJ SOLN
10.0000 mg | Freq: Once | INTRAMUSCULAR | Status: AC
  Administered 2023-10-25: 10 mg via INTRAPLEURAL
  Filled 2023-10-25: qty 10

## 2023-10-25 MED ORDER — MORPHINE SULFATE (PF) 2 MG/ML IV SOLN
2.0000 mg | Freq: Once | INTRAVENOUS | Status: AC
  Administered 2023-10-25: 2 mg via INTRAVENOUS
  Filled 2023-10-25: qty 1

## 2023-10-25 MED ORDER — HYDROXYZINE HCL 25 MG PO TABS
25.0000 mg | ORAL_TABLET | Freq: Three times a day (TID) | ORAL | Status: DC | PRN
  Administered 2023-10-25 – 2023-11-10 (×17): 25 mg via ORAL
  Filled 2023-10-25 (×18): qty 1

## 2023-10-25 MED ORDER — SODIUM CHLORIDE 0.9% FLUSH
10.0000 mL | Freq: Three times a day (TID) | INTRAVENOUS | Status: DC
Start: 2023-10-25 — End: 2023-10-30
  Administered 2023-10-25 – 2023-10-29 (×10): 10 mL via INTRAPLEURAL

## 2023-10-25 MED ORDER — SENNOSIDES-DOCUSATE SODIUM 8.6-50 MG PO TABS
1.0000 | ORAL_TABLET | Freq: Two times a day (BID) | ORAL | Status: DC
  Administered 2023-10-25 – 2023-11-11 (×26): 1 via ORAL
  Filled 2023-10-25 (×31): qty 1

## 2023-10-25 MED ORDER — POLYETHYLENE GLYCOL 3350 17 G PO PACK
17.0000 g | PACK | Freq: Two times a day (BID) | ORAL | Status: DC
  Administered 2023-10-25 – 2023-10-29 (×3): 17 g via ORAL
  Filled 2023-10-25 (×25): qty 1

## 2023-10-25 MED ORDER — STERILE WATER FOR INJECTION IJ SOLN
5.0000 mg | Freq: Once | RESPIRATORY_TRACT | Status: AC
  Administered 2023-10-25: 5 mg via INTRAPLEURAL
  Filled 2023-10-25: qty 5

## 2023-10-25 NOTE — Progress Notes (Signed)
 PROGRESS NOTE    Pam Soto  QIO:[id number] DOB: 11-25-1990 DOA: 10/14/2023 PCP: Mechele Claude, MD    No chief complaint on file.   Brief Narrative:  Pam Soto is 33 y.o. female with history of IV drug abuse who presents with right-sided chest pain and shortness of breath.  Patient endorses muscle cramps over the past few days which she believes are secondary to depleted electrolytes.  She reports her pleuritic chest pain has become severe and thus presented to the hospital.  She reports she has been trying to abstain from illicit substances and has been taking methadone.  She was concerned that her musculoskeletal pain was a result of withdrawal and she injected fentanyl yesterday.  On arrival to the ED patient was febrile, saturating the mid 90s on room air, tachypneic, tachycardic.  Labs reveal hyponatremia to 129, hypokalemia to 2.9, hypomagnesemia 1.6, leukocytosis 12.4 K.  Respiratory viral panel was negative.  Chest x-ray concerning for multifocal pneumonia.  CTA concerning for multifocal infiltrates which may be septic emboli.  Blood cultures were collected, the patient was started on IV fluids.    Assessment & Plan:   Principal Problem:   Pneumonia Active Problems:   Hyponatremia   Hypokalemia   Hypomagnesemia   Prolonged QT interval   IV drug abuse (HCC)   Multifocal pneumonia   Gram-positive cocci bacteremia   Empyema lung (HCC)   Sepsis due to methicillin susceptible Staphylococcus aureus (HCC)   MSSA bacteremia   Necrotic pneumonia (HCC)   #1 loculated right-sided pleural effusion/empyema/multifocal pneumonia -Patient noted to have some shortness of breath, ongoing pleuritic chest pain, repeat CT chest done concerning for pleural effusion and worsening multilobar pneumonia. -Patient seen in consultation by PCCM underwent therapeutic and diagnostic thoracentesis 10/21/2023 per IR with 300 cc of amber-colored fluid extracted. -Preliminary cultures with rare  Staphylococcus aureus. -By lights criteria exudative process. -Patient with MSSA bacteremia in the setting of IV drug use. -Pulmonary following and recommending repeat chest x-ray today, 10/23/2023 with stable right pleural effusion with associated atelectasis.  -Patient with ongoing right pleuritic chest pain.  -Continue empiric IV antibiotics of Unasyn. -CT chest ordered per PCCM/pulmonary which showed moderate loculated right hydropneumothorax.  Pneumothorax component is small may be related to recent thoracentesis.  Complete opacification of right lower lobe, suspicious for pneumonia.  Bubbly air peripherally, unclear if this is within pleural fluid or lung parenchyma, raising concern for necrotic pneumonia-intraparenchymal.  Partial opacification of right middle lobe.  Small left pleural effusion is new.  Bandlike atelectasis in the left lower lobe. -Chest tube placed by pulmonary/PCCM on 10/24/2023 and patient underwent fibrinolytic treatment to date 10/25/2023. -Per pulmonary.  2.  MSSA bacteremia/multifocal pneumonia/necrotic pneumonia -Patient on admission noted to have multifocal infiltrates noted in right lower lobe on initial CT angiogram chest with concerns for septic emboli. -Repeat CT revealed worsening pleural effusion. -3/4 blood cultures positive for MSSA bacteremia. -Repeat cultures from 10/16/2023 negative to date. -2D echo and TEE negative for vegetation/endocarditis. 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 -Patient noted will need cefazolin for 4 weeks from 3/5 with EOT on/11/11/2023 followed by dose of oritavancin/dalbavancin on 11/12/2023 to complete a 6-week course of antibiotic treatment. -Due to current effusion/?  Empyema patient currently on IV Unasyn. -CT chest ordered per PCCM/pulmonary which showed moderate loculated right hydropneumothorax.   Pneumothorax component is small may be related to  recent thoracentesis.  Complete opacification of right lower lobe, suspicious for pneumonia.  Bubbly air peripherally, unclear if this is within pleural fluid or lung parenchyma, raising concern for necrotic pneumonia-intraparenchymal.  Partial opacification of right middle lobe.  Small left pleural effusion is new.  Bandlike atelectasis in the left lower lobe.. -Due to patient's active IV drug use she is not a candidate for PICC therapy and will need to remain in the hospital in supervised setting for duration of treatment.  Patient noted to be amenable to this. -Continue incentive spirometry, flutter valve. -Patient reassessed by ID on 10/24/2023, MRI of the T and L-spine was obtained with no discitis or osteomyelitis noted. -ID recommended continuation of plan for at least 4 weeks of IV Unasyn from 3/5 with EOT/4/1 followed by 1 dose of oritavancin/dalbavancin on 4/2 to complete a 6-week course of antibiotic treatment. -ID was following but have signed off again as of 10/24/2023.   3.  Hep B nonimmune -Consider vaccination once acute illness has resolved.  4.  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 -Will need outpatient treatment. -Will defer to ID.  5.  Sepsis -Patient met criteria for sepsis on arrival with leukocytosis, tachycardia, tachypnea, likely secondary to pneumonia, bacteremia, empyema. -Continue current empiric IV antibiotics as noted above.  6.  Normocytic anemia/anemia of chronic disease/low vitamin B12 levels -Patient with no overt bleeding. -Hemoglobin stable at 10.0. -Anemia panel with iron of 13, TIBC of 234, ferritin of 96, folate 7.5, vitamin B12 266. -Due to low vitamin B12 levels patient started on vitamin B12 1000 mcg subcutaneously daily while in the hospital and will transition to oral vitamin B12 on discharge.  -Follow H&H. -Transfusion threshold hemoglobin < 7.  7.  Polysubstance abuse/IV drug abuse -Patient noted  to have almost daily use of heroin/fentanyl, cocaine. -Occasional use of benzodiazepines though not consistently.  Patient denies alcohol use. -Patient noted to be working towards abstinence and on methadone in outpatient setting. -Patient noted to use IV fentanyl the day prior to arrival. -Patient desires continued abstinence and would like medication assisted withdrawal while admitted admitted. -Continue home regimen methadone. -TOC consulted for resource assistance.  8.  Right-sided pleuritis -Likely secondary to problem #1 and now with new chest tube placement. -Pain better controlled per patient. -Continue current regimen of methadone, gabapentin, Toradol, Tylenol, lidocaine patches, as needed oxycodone. -Continue treatment as improvement #1.  9.  Hypokalemia/hypomagnesemia -Repleted.   -Potassium at 3.8, magnesium at 1.9.   -Repeat labs in the AM.   10.  Prolonged QT -Optimize electrolytes, avoid QT prolongation medications. -Repeat EKG 3/5 showed resolution of QT prolongation.  11.  Hyponatremia -Resolved.  12.  AKI, POA -Resolved.      DVT prophylaxis: Lovenox Code Status: Full Family Communication: Updated patient.  No family at bedside. Disposition: Home once IV antibiotics are completed and cleared by pulmonary and ID.  Status is: Inpatient Remains inpatient appropriate because: Severity of illness, need for IV antibiotics will need until/08/2023, not a candidate for PICC therapy.  Will need to remain in-house until at least IV antibiotics are completed.   Consultants:  PCCM/pulmonary: Dr. Isaiah Serge 10/21/2023 ID: Dr. Elinor Parkinson 10/15/2023, Dr. Thedore Mins 10/24/2023  Procedures:  CT angiogram chest 10/14/2023 CT chest with contrast 10/19/2023 Chest x-ray 10/14/2023, 10/21/2023, 10/23/2023 2D echo 10/15/2023 TEE 10/17/2023 Ultrasound-guided diagnostic and therapeutic right-sided thoracentesis yielding 300 cc of amber-colored fluid: Per IR: Anders Grant, NP 10/21/2023 CT  chest 10/23/2023 Chest tube placement by Dr. Merrily Pew 10/24/2023 MRI T and L-spine  10/24/2023  Antimicrobials:  Anti-infectives (From admission, onward)    Start     Dose/Rate Route Frequency Ordered Stop   10/19/23 1700  Ampicillin-Sulbactam (UNASYN) 3 g in sodium chloride 0.9 % 100 mL IVPB        3 g 200 mL/hr over 30 Minutes Intravenous Every 6 hours 10/19/23 1609     10/16/23 0915  ceFAZolin (ANCEF) IVPB 2g/100 mL premix  Status:  Discontinued        2 g 200 mL/hr over 30 Minutes Intravenous Every 8 hours 10/16/23 0826 10/19/23 1610   10/15/23 1000  vancomycin (VANCOCIN) IVPB 1000 mg/200 mL premix  Status:  Discontinued        1,000 mg 200 mL/hr over 60 Minutes Intravenous Every 12 hours 10/15/23 0406 10/16/23 0826   10/15/23 0600  cefTRIAXone (ROCEPHIN) 2 g in sodium chloride 0.9 % 100 mL IVPB  Status:  Discontinued        2 g 200 mL/hr over 30 Minutes Intravenous Every 24 hours 10/15/23 0354 10/16/23 0826   10/15/23 0400  doxycycline (VIBRAMYCIN) 100 mg in sodium chloride 0.9 % 250 mL IVPB  Status:  Discontinued        100 mg 125 mL/hr over 120 Minutes Intravenous Every 12 hours 10/15/23 0354 10/16/23 0826   10/14/23 2315  ceFEPIme (MAXIPIME) 2 g in sodium chloride 0.9 % 100 mL IVPB        2 g 200 mL/hr over 30 Minutes Intravenous  Once 10/14/23 2303 10/15/23 0109   10/14/23 2315  Vancomycin (VANCOCIN) 1,500 mg in sodium chloride 0.9 % 500 mL IVPB        1,500 mg 250 mL/hr over 120 Minutes Intravenous  Once 10/14/23 2304 10/15/23 0228         Subjective: Patient laying in bed sleeping.  Stated unable to sleep last night due to being uncomfortable after chest tube placement.  Still with some right-sided pleuritic pain and pain around chest tube.  Still with shortness of breath somewhat shallow.  No chest pain.  No nausea or vomiting.  States her chest tube difficult to get up and go to the bathroom.   Objective: Vitals:   10/24/23 1642 10/24/23 2003 10/24/23 2323 10/25/23 0933   BP: (!) 137/90 124/83 127/80 132/89  Pulse: 94 85 (!) 104 77  Resp:  18 18 16   Temp:  98.4 F (36.9 C) 98.4 F (36.9 C) 98.5 F (36.9 C)  TempSrc:    Oral  SpO2: 97% 96% 95% 93%  Weight:      Height:        Intake/Output Summary (Last 24 hours) at 10/25/2023 1739 Last data filed at 10/25/2023 1344 Gross per 24 hour  Intake 250 ml  Output 832 ml  Net -582 ml   Filed Weights   10/14/23 2300 10/17/23 1151  Weight: 81.6 kg 81.6 kg    Examination:  General exam: NAD. Respiratory system: Decreased breath sounds in the right base.  Less tight.  No significant wheezing.  No use of accessory muscles of respiration.  Chest tube in place on the right.  Cardiovascular system: RRR no murmurs rubs or gallops.  No JVD.  No pitting lower extremity edema. Gastrointestinal system: Abdomen is soft, nontender, nondistended, positive bowel sounds.  No rebound.  No guarding. Central nervous system: Alert and oriented.  Moving extremities spontaneously.  No focal neurological deficits. Extremities: Symmetric 5 x 5 power. Skin: No rashes, lesions or ulcers Psychiatry: Judgement and insight appear normal. Mood &  affect appropriate.     Data Reviewed: I have personally reviewed following labs and imaging studies  CBC: Recent Labs  Lab 10/21/23 0626 10/22/23 0536 10/23/23 1137 10/24/23 0415 10/25/23 0907  WBC 7.7 9.3 11.5* 9.6 9.2  NEUTROABS 4.3 6.1 7.8* 6.9 5.9  HGB 9.6* 9.7* 10.9* 10.0* 9.3*  HCT 28.7* 29.9* 33.3* 30.3* 28.6*  MCV 87.5 89.3 89.5 87.1 89.7  PLT 420* 439* 479* 485* 480*    Basic Metabolic Panel: Recent Labs  Lab 10/19/23 0612 10/20/23 0326 10/21/23 0626 10/22/23 0536 10/23/23 1137 10/24/23 0415 10/25/23 0907  NA 138 136 137 135 138 135 136  K 3.6 4.0 4.0 4.1 4.3 3.8 3.8  CL 103 103 106 105 106 104 103  CO2 25 23 24 23  19* 22 20*  GLUCOSE 94 88 95 106* 78 93 94  BUN 8 8 11 13 10 10 11   CREATININE 0.56 0.58 0.62 0.79 0.62 0.63 0.56  CALCIUM 8.4* 8.6* 8.3*  8.3* 8.7* 8.5* 8.3*  MG 1.9 1.9 2.0 2.1 2.1 1.9 1.8  PHOS 4.7* 4.8*  --   --  4.7* 4.2  --     GFR: Estimated Creatinine Clearance: 113 mL/min (by C-G formula based on SCr of 0.56 mg/dL).  Liver Function Tests: Recent Labs  Lab 10/19/23 0612 10/20/23 0326 10/21/23 0626 10/22/23 0536 10/23/23 1137 10/24/23 0415  AST 17 19 15  14* 17  --   ALT 12 11 10 10 13   --   ALKPHOS 68 67 61 66 77  --   BILITOT <0.2 0.3 0.6 0.3 0.3  --   PROT 6.4* 6.6 6.1* 6.2* 7.5  --   ALBUMIN 2.2* 2.2* 1.9* 1.9* 2.2* 2.0*    CBG: No results for input(s): "GLUCAP" in the last 168 hours.   Recent Results (from the past 240 hours)  Culture, blood (Routine X 2) w Reflex to ID Panel     Status: None   Collection Time: 10/16/23  4:08 AM   Specimen: BLOOD  Result Value Ref Range Status   Specimen Description BLOOD BLOOD LEFT HAND  Final   Special Requests   Final    BOTTLES DRAWN AEROBIC AND ANAEROBIC Blood Culture adequate volume   Culture   Final    NO GROWTH 5 DAYS Performed at [facility], [address] [place], [place] [address]    Report Status 10/21/2023 FINAL  Final  Culture, blood (Routine X 2) w Reflex to ID Panel     Status: None   Collection Time: 10/16/23  4:08 AM   Specimen: BLOOD  Result Value Ref Range Status   Specimen Description BLOOD BLOOD LEFT WRIST  Final   Special Requests   Final    BOTTLES DRAWN AEROBIC AND ANAEROBIC Blood Culture adequate volume   Culture   Final    NO GROWTH 5 DAYS Performed at [facility], [address], [place], [place] [address]    Report Status 10/21/2023 FINAL  Final  MRSA Next Gen by PCR, Nasal     Status: None   Collection Time: 10/19/23  4:03 PM   Specimen: Nasal Mucosa; Nasal Swab  Result Value Ref Range Status   MRSA by PCR Next Gen NOT DETECTED NOT DETECTED Final    Comment: (NOTE) The GeneXpert MRSA Assay (FDA approved for NASAL specimens only), is one component of a comprehensive MRSA colonization surveillance program. It is not  intended to diagnose MRSA infection nor to guide or monitor treatment for MRSA infections. Test performance  is not FDA approved in patients less than 50 years old. Performed at [facility], [address], [place], [place] [address]   Acid Fast Smear (AFB)     Status: None   Collection Time: 10/21/23 11:22 AM   Specimen: Lung, Right; Pleural Fluid  Result Value Ref Range Status   AFB Specimen Processing Direct Inoculation  Final   Acid Fast Smear Negative  Final    Comment: (NOTE) Performed At: [facility] [address] [place], [place] [id number] Jolene Schimke MD OV:[id number]    Source (AFB) PLEURAL  Final    Comment: Performed at [facility], [address], [place], [place] [address]  Body fluid culture w Gram Stain     Status: None   Collection Time: 10/21/23 11:22 AM   Specimen: Lung, Right; Pleural Fluid  Result Value Ref Range Status   Specimen Description PLEURAL  Final   Special Requests NONE  Final   Gram Stain NO WBC SEEN NO ORGANISMS SEEN   Final   Culture   Final    RARE STAPHYLOCOCCUS AUREUS CRITICAL RESULT CALLED TO, READ BACK BY AND VERIFIED WITH: RN L.YANG AT 1305 ON 10/22/2023 BY T.SAAD. Performed at [facility], [address] [place], [place] [address]    Report Status 10/25/2023 FINAL  Final   Organism ID, Bacteria STAPHYLOCOCCUS AUREUS  Final      Susceptibility   Staphylococcus aureus - MIC*    CIPROFLOXACIN >=8 RESISTANT Resistant     ERYTHROMYCIN >=8 RESISTANT Resistant     GENTAMICIN <=0.5 SENSITIVE Sensitive     OXACILLIN 0.5 SENSITIVE Sensitive     TETRACYCLINE <=1 SENSITIVE Sensitive     VANCOMYCIN 1 SENSITIVE Sensitive     TRIMETH/SULFA >=320 RESISTANT Resistant     CLINDAMYCIN <=0.25 SENSITIVE Sensitive     RIFAMPIN <=0.5 SENSITIVE Sensitive     Inducible Clindamycin NEGATIVE Sensitive     LINEZOLID 2 SENSITIVE Sensitive     * RARE STAPHYLOCOCCUS AUREUS         Radiology Studies: DG CHEST PORT 1  VIEW Result Date: 10/25/2023 CLINICAL DATA:  Follow-up loculated right pleural effusion. EXAM: PORTABLE CHEST 1 VIEW COMPARISON:  10/24/2023 and 10/23/2023. FINDINGS: Stable right inferior hemithorax chest tube. Hazy opacity persists along the right mid lung with more confluent opacity at the right lung base. Milder linear opacities noted at the left lung base. Remainder of the left lung is clear. No pneumothorax. IMPRESSION: 1. No significant change from the previous day's study. 2. Stable right chest tube. No pneumothorax. 3. Persistent right mid and lower lung opacity consistent with a combination of pleural fluid and atelectasis/pneumonia. Electronically Signed   By: Amie Portland M.D.   On: 10/25/2023 11:47   DG CHEST PORT 1 VIEW Result Date: 10/24/2023 CLINICAL DATA:  Chest tube placement EXAM: PORTABLE CHEST 1 VIEW COMPARISON:  10/23/2023 FINDINGS: Interval placement of right basilar chest tube. No pneumothorax. Moderate right pleural effusion, slightly decreased since prior study. Diffuse right lung airspace disease, worsening since prior study. No confluent opacity on the left. IMPRESSION: Interval placement of right chest tube, no pneumothorax. Continued moderate right pleural effusion. Diffuse right lung airspace disease, worsening since prior study. Electronically Signed   By: Charlett Nose M.D.   On: 10/24/2023 22:32   MR Lumbar Spine W Wo Contrast Result Date: 10/24/2023 CLINICAL DATA:  Infection EXAM: MRI LUMBAR SPINE WITHOUT AND WITH CONTRAST TECHNIQUE: Multiplanar and multiecho pulse sequences of the lumbar spine were  obtained without and with intravenous contrast. CONTRAST:  8mL GADAVIST GADOBUTROL 1 MMOL/ML IV SOLN COMPARISON:  None Available. FINDINGS: Segmentation:  Standard. Alignment:  Physiologic. Vertebrae: Vertebral body heights are maintained. No fracture. No evidence of discitis. No evidence of facet joint septic arthritis. Small benign intraosseous hemangiomas in the L3 and L4  vertebral bodies. No suspicious marrow replacing bone lesion. Conus medullaris and cauda equina: Conus extends to the T12 level. Conus and cauda equina appear normal. No abnormal epidural space collection. Paraspinal and other soft tissues: Negative. Disc levels: Negative. Intervertebral disc heights are preserved without disc desiccation or focal disc protrusion. Normal facet joints. No foraminal or canal stenosis at any level. IMPRESSION: Normal pre- and post-contrast MRI of the lumbar spine. No evidence of discitis or facet joint septic arthritis. Electronically Signed   By: Duanne Guess D.O.   On: 10/24/2023 19:43   MR THORACIC SPINE W WO CONTRAST Result Date: 10/24/2023 CLINICAL DATA:  Infection EXAM: MRI THORACIC WITHOUT AND WITH CONTRAST TECHNIQUE: Multiplanar and multiecho pulse sequences of the thoracic spine were obtained without and with intravenous contrast. CONTRAST:  8mL GADAVIST GADOBUTROL 1 MMOL/ML IV SOLN COMPARISON:  Chest CT 10/23/2023 FINDINGS: Alignment:  Physiologic. Vertebrae: No acute fracture. No evidence of discitis. No evidence of facet joint septic arthritis. No marrow replacing bone lesion. Cord: Normal signal and morphology. No abnormal collection in the epidural space. Paraspinal and other soft tissues: Large complex, multiloculated fluid and air collection in the right pleural space with consolidation of the right lower lobe. No posterior paraspinal abnormality. Disc levels: Shallow right paracentral disc protrusion at T2-T3. Remaining discs demonstrate no significant protrusions. Facet joints within normal limits. Canal and foramina are widely patent at all levels. IMPRESSION: 1. No evidence of discitis-osteomyelitis or septic facet arthritis of the thoracic spine. 2. Large complex, multiloculated fluid and air collection in the right pleural space with consolidation of the right lower lobe. See recent dedicated CT chest. 3. Shallow right paracentral disc protrusion at T2-T3.  No canal or foraminal stenosis at any level. Electronically Signed   By: Duanne Guess D.O.   On: 10/24/2023 19:35        Scheduled Meds:  acetaminophen  1,000 mg Oral Q6H   cyanocobalamin  1,000 mcg Subcutaneous Daily   enoxaparin (LOVENOX) injection  40 mg Subcutaneous Q24H   gabapentin  300 mg Oral TID   lidocaine  1 patch Transdermal Q24H   methadone  40 mg Oral Daily   nicotine  21 mg Transdermal Daily   pantoprazole  40 mg Oral Daily   polyethylene glycol  17 g Oral BID   senna-docusate  1 tablet Oral BID   sodium chloride flush  10 mL Intrapleural Q8H   sodium chloride flush  10 mL Intrapleural Q8H   sodium chloride flush  3 mL Intravenous Q12H   Continuous Infusions:  ampicillin-sulbactam (UNASYN) IV 3 g (10/25/23 1720)     LOS: 10 days    Time spent: 40 minutes    Ramiro Harvest, MD [facility]   To contact the attending provider between 7A-7P or the covering provider during after hours 7P-7A, please log into the web site [email or web address] and access using universal [place] password for that web site. If you do not have the password, please call the hospital operator.  10/25/2023, 5:39 PM

## 2023-10-25 NOTE — Procedures (Signed)
 Pleural Fibrinolytic Administration Procedure Note  EPSIE WALTHALL  [id number]  [birth year]  Date:10/25/23  Time:1:38 PM   Provider Performing:Kentrel Clevenger   Procedure: Pleural Fibrinolysis Initial day (40981)  Indication(s) Fibrinolysis of complicated pleural effusion  Consent Risks of the procedure as well as the alternatives and risks of each were explained to the patient and/or caregiver.  Consent for the procedure was obtained.   Anesthesia None   Time Out Verified patient identification, verified procedure, site/side was marked, verified correct patient position, special equipment/implants available, medications/allergies/relevant history reviewed, required imaging and test results available.   Sterile Technique Hand hygiene, gloves   Procedure Description Existing pleural catheter was cleaned and accessed in sterile manner.  10mg  of tPA in 30cc of saline and 5mg  of dornase in 30cc of sterile water were injected into pleural space using existing pleural catheter.  Catheter will be clamped for 1 hour and then placed back to suction.   Complications/Tolerance None; patient tolerated the procedure well.   EBL None   Specimen(s) None  [name] MD [facility] See Amion for pager  If no response to pager , please call [phone] until 7pm After 7:00 pm call Elink  [phone] 10/25/2023, 1:38 PM

## 2023-10-25 NOTE — Plan of Care (Signed)

## 2023-10-25 NOTE — Progress Notes (Signed)
 NAME:  Pam Soto, MRN:  [id number], DOB:  Jan 26, 1991, LOS: 10 ADMISSION DATE:  10/14/2023, CONSULTATION DATE:  3/11 REFERRING MD:  Rennis Chris, CHIEF COMPLAINT:  pleural effusion    History of Present Illness:  33 year old female patient who was initially admitted on 3/4 with chief complaint of right sided chest and back discomfort, with associated shortness of breath also Noting fever and chills.  Reportedly had fever the week prior..  Admitted to the hospital service on arrival febrile, room air sats 90s sodium 129 respiratory viral panel was negative chest x-ray concerning for multifocal pneumonia.  CT concerning for possible septic emboli culture sent started on broad-spectrum antibiotics.  Blood cultures positive for MSSA on 3/4, followed by infectious disease in addition to internal medicine service.  Initial echocardiogram negative for evidence of endocarditis.  TEE also completed this was negative for endocarditis as well.  Over the course of her mission antibiotics have been tailored by infectious disease, transitioned to cefazolin with plan to treat for 4 weeks followed by dose of oritavancin/dalbavancin on 4/2 to complete the 6-week course.  She has had fairly consistent and worsening right sided chest discomfort repeated noncontrasted CT on the ninth showed moderate right-sided effusion with possible element of loculation.  She was subsequently transferred to [facility] for pulmonary evaluation  Pertinent  Medical History  IV drug abuse, posttraumatic stress disorder Significant Hospital Events: Including procedures, antibiotic start and stop dates in addition to other pertinent events   3/4 admitted with right sided chest pain and fever, cultures positive for MSSA started on broad-spectrum antibiotics 3/5 seen by infectious disease for bacteremia 3/6 antibiotics changed to cefazolin per recommendation of infectious disease 3/7 TEE negative for endocarditis 3/9 increased pleuritic discomfort  CT chest small to moderate size right pleural effusion.  Scattered areas on the left side continue to raise concern for septic emboli 3/10 moved to [facility] for pulmonary evaluation 3/11 pulmonary consultation, Underwent thoracentesis by interventional radiology 3/14 chest tube placement  Interim History / Subjective:   Has right chest pain which continues.  Chest tube placed with about 500 cc output  Objective   Blood pressure 127/80, pulse (!) 104, temperature 98.4 F (36.9 C), resp. rate 18, height 5\' 7"  (1.702 m), weight 81.6 kg, last menstrual period 10/21/2023, SpO2 95%, unknown if currently breastfeeding.        Intake/Output Summary (Last 24 hours) at 10/25/2023 0914 Last data filed at 10/25/2023 0630 Gross per 24 hour  Intake --  Output 750 ml  Net -750 ml    Filed Weights   10/14/23 2300 10/17/23 1151  Weight: 81.6 kg 81.6 kg    Examination:Blood pressure 127/80, pulse (!) 104, temperature 98.4 F (36.9 C), resp. rate 18, height 5\' 7"  (1.702 m), weight 81.6 kg, last menstrual period 10/21/2023, SpO2 95%, unknown if currently breastfeeding. Gen:      No acute distress HEENT:  EOMI, sclera anicteric Neck:     No masses; no thyromegaly Lungs:    Clear to auscultation bilaterally; normal respiratory effort CV:         Regular rate and rhythm; no murmurs Abd:      + bowel sounds; soft, non-tender; no palpable masses, no distension Ext:    No edema; adequate peripheral perfusion Skin:      Warm and dry; no rash Neuro: alert and oriented x 3 Psych: normal mood and affect   Chest x-ray with persistent right effusion, consolidation  Resolved Hospital Problem list  Assessment & Plan:  Bilateral pneumonia with cavitary changes Right empyema MSSA bacteremia in the setting of IV drug use No evidence of endocarditis on echocardiogram Continue antibiotics per ID Monitor chest tube output.  Start pleural lytic therapy for complete drainage.  Best Practice (right  click and "Reselect all SmartList Selections" daily)   Per primary team  Signature:   [name] MD [facility] See Amion for pager  If no response to pager , please call [phone] until 7pm After 7:00 pm call Elink  [phone] 10/25/2023, 9:14 AM

## 2023-10-25 NOTE — Plan of Care (Signed)
  Problem: Education: Goal: Knowledge of General Education information will improve Description: Including pain rating scale, medication(s)/side effects and non-pharmacologic comfort measures 10/25/2023 0808 by Phillis Knack, RN Outcome: Progressing 10/25/2023 0808 by Phillis Knack, RN Outcome: Progressing   Problem: Health Behavior/Discharge Planning: Goal: Ability to manage health-related needs will improve 10/25/2023 0808 by Phillis Knack, RN Outcome: Progressing 10/25/2023 0808 by Phillis Knack, RN Outcome: Progressing   Problem: Clinical Measurements: Goal: Ability to maintain clinical measurements within normal limits will improve 10/25/2023 0808 by Phillis Knack, RN Outcome: Progressing 10/25/2023 0808 by Phillis Knack, RN Outcome: Progressing Goal: Will remain free from infection Outcome: Progressing Goal: Diagnostic test results will improve Outcome: Progressing Goal: Cardiovascular complication will be avoided Outcome: Progressing   Problem: Activity: Goal: Risk for activity intolerance will decrease Outcome: Progressing

## 2023-10-26 ENCOUNTER — Inpatient Hospital Stay (HOSPITAL_COMMUNITY): Payer: MEDICAID

## 2023-10-26 DIAGNOSIS — J15211 Pneumonia due to Methicillin susceptible Staphylococcus aureus: Secondary | ICD-10-CM | POA: Diagnosis not present

## 2023-10-26 DIAGNOSIS — R9431 Abnormal electrocardiogram [ECG] [EKG]: Secondary | ICD-10-CM | POA: Diagnosis not present

## 2023-10-26 DIAGNOSIS — J869 Pyothorax without fistula: Secondary | ICD-10-CM | POA: Diagnosis not present

## 2023-10-26 DIAGNOSIS — E871 Hypo-osmolality and hyponatremia: Secondary | ICD-10-CM | POA: Diagnosis not present

## 2023-10-26 LAB — CBC
HCT: 27.9 % — ABNORMAL LOW (ref 36.0–46.0)
Hemoglobin: 9.1 g/dL — ABNORMAL LOW (ref 12.0–15.0)
MCH: 28.8 pg (ref 26.0–34.0)
MCHC: 32.6 g/dL (ref 30.0–36.0)
MCV: 88.3 fL (ref 80.0–100.0)
Platelets: 473 10*3/uL — ABNORMAL HIGH (ref 150–400)
RBC: 3.16 MIL/uL — ABNORMAL LOW (ref 3.87–5.11)
RDW: 12.2 % (ref 11.5–15.5)
WBC: 6.7 10*3/uL (ref 4.0–10.5)
nRBC: 0 % (ref 0.0–0.2)

## 2023-10-26 LAB — BASIC METABOLIC PANEL
Anion gap: 12 (ref 5–15)
BUN: 13 mg/dL (ref 6–20)
CO2: 24 mmol/L (ref 22–32)
Calcium: 8.6 mg/dL — ABNORMAL LOW (ref 8.9–10.3)
Chloride: 103 mmol/L (ref 98–111)
Creatinine, Ser: 0.49 mg/dL (ref 0.44–1.00)
GFR, Estimated: >60 mL/min (ref >60)
Glucose, Bld: 89 mg/dL (ref 70–99)
Potassium: 3.8 mmol/L (ref 3.5–5.1)
Sodium: 139 mmol/L (ref 135–145)

## 2023-10-26 LAB — MAGNESIUM: Magnesium: 2.1 mg/dL (ref 1.7–2.4)

## 2023-10-26 MED ORDER — MORPHINE SULFATE (PF) 2 MG/ML IV SOLN
2.0000 mg | Freq: Once | INTRAVENOUS | Status: AC
  Administered 2023-10-26: 2 mg via INTRAVENOUS
  Filled 2023-10-26: qty 1

## 2023-10-26 MED ORDER — SODIUM CHLORIDE (PF) 0.9 % IJ SOLN
10.0000 mg | Freq: Once | INTRAMUSCULAR | Status: AC
  Administered 2023-10-26: 10 mg via INTRAPLEURAL
  Filled 2023-10-26: qty 10

## 2023-10-26 MED ORDER — STERILE WATER FOR INJECTION IJ SOLN
5.0000 mg | Freq: Once | RESPIRATORY_TRACT | Status: AC
  Administered 2023-10-26: 5 mg via INTRAPLEURAL
  Filled 2023-10-26: qty 5

## 2023-10-26 NOTE — Progress Notes (Signed)
 NAME:  Pam Soto, MRN:  [id number], DOB:  10-27-1990, LOS: 11 ADMISSION DATE:  10/14/2023, CONSULTATION DATE:  3/11 REFERRING MD:  Rennis Chris, CHIEF COMPLAINT:  pleural effusion    History of Present Illness:  33 year old female patient who was initially admitted on 3/4 with chief complaint of right sided chest and back discomfort, with associated shortness of breath also Noting fever and chills.  Reportedly had fever the week prior..  Admitted to the hospital service on arrival febrile, room air sats 90s sodium 129 respiratory viral panel was negative chest x-ray concerning for multifocal pneumonia.  CT concerning for possible septic emboli culture sent started on broad-spectrum antibiotics.  Blood cultures positive for MSSA on 3/4, followed by infectious disease in addition to internal medicine service.  Initial echocardiogram negative for evidence of endocarditis.  TEE also completed this was negative for endocarditis as well.  Over the course of her mission antibiotics have been tailored by infectious disease, transitioned to cefazolin with plan to treat for 4 weeks followed by dose of oritavancin/dalbavancin on 4/2 to complete the 6-week course.  She has had fairly consistent and worsening right sided chest discomfort repeated noncontrasted CT on the ninth showed moderate right-sided effusion with possible element of loculation.  She was subsequently transferred to [facility] for pulmonary evaluation  Pertinent  Medical History  IV drug abuse, posttraumatic stress disorder Significant Hospital Events: Including procedures, antibiotic start and stop dates in addition to other pertinent events   3/4 admitted with right sided chest pain and fever, cultures positive for MSSA started on broad-spectrum antibiotics 3/5 seen by infectious disease for bacteremia 3/6 antibiotics changed to cefazolin per recommendation of infectious disease 3/7 TEE negative for endocarditis 3/9 increased pleuritic discomfort  CT chest small to moderate size right pleural effusion.  Scattered areas on the left side continue to raise concern for septic emboli 3/10 moved to [facility] for pulmonary evaluation 3/11 pulmonary consultation, Underwent thoracentesis by interventional radiology 3/14 chest tube placement 3/15 intrapleural lytics  Interim History / Subjective:   522 cc output from chest tube after first dose of intrapleural lytics  Objective   Blood pressure 128/83, pulse 72, temperature 98.1 F (36.7 C), resp. rate 17, height 5\' 7"  (1.702 m), weight 81.6 kg, last menstrual period 10/21/2023, SpO2 96%, unknown if currently breastfeeding.        Intake/Output Summary (Last 24 hours) at 10/26/2023 1308 Last data filed at 10/26/2023 1100 Gross per 24 hour  Intake --  Output 1672 ml  Net -1672 ml    Filed Weights   10/14/23 2300 10/17/23 1151  Weight: 81.6 kg 81.6 kg    Examination: Gen:      No acute distress HEENT:  EOMI, sclera anicteric Neck:     No masses; no thyromegaly Lungs:    Clear to auscultation bilaterally; normal respiratory effort CV:         Regular rate and rhythm; no murmurs Abd:      + bowel sounds; soft, non-tender; no palpable masses, no distension Ext:    No edema; adequate peripheral perfusion Skin:      Warm and dry; no rash Neuro: alert and oriented x 3 Psych: normal mood and affect   Chest x-ray with improving right effusion, consolidation Resolved Hospital Problem list     Assessment & Plan:  Bilateral pneumonia with cavitary changes Right empyema MSSA bacteremia in the setting of IV drug use No evidence of endocarditis on echocardiogram Continue antibiotics per ID Monitor  chest tube output.  Continue pleural lytic therapy for complete drainage Follow chest x-ray  Best Practice (right click and "Reselect all SmartList Selections" daily)   Per primary team  Signature:   [name] MD  Pulmonary & Critical care See Amion for pager  If no  response to pager , please call [phone] until 7pm After 7:00 pm call Elink  [phone] 10/26/2023, 1:08 PM

## 2023-10-26 NOTE — Procedures (Signed)
 Pleural Fibrinolytic Administration Procedure Note  Pam Soto  [id number]  28-Dec-1990  Date:10/26/23  Time:1:20 PM   Provider Performing:Tou Hayner   Procedure: Pleural Fibrinolysis Subsequent day (82956)  Indication(s) Fibrinolysis of complicated pleural effusion  Consent Risks of the procedure as well as the alternatives and risks of each were explained to the patient and/or caregiver.  Consent for the procedure was obtained.   Anesthesia None   Time Out Verified patient identification, verified procedure, site/side was marked, verified correct patient position, special equipment/implants available, medications/allergies/relevant history reviewed, required imaging and test results available.   Sterile Technique Hand hygiene, gloves   Procedure Description Existing pleural catheter was cleaned and accessed in sterile manner.  10mg  of tPA in 30cc of saline and 5mg  of dornase in 30cc of sterile water were injected into pleural space using existing pleural catheter.  Catheter will be clamped for 1 hour and then placed back to suction.   Complications/Tolerance None; patient tolerated the procedure well.   EBL None   Specimen(s) None  [name] MD [facility] See Amion for pager  If no response to pager , please call [phone] until 7pm After 7:00 pm call Elink  [phone] 10/26/2023, 1:20 PM

## 2023-10-26 NOTE — Progress Notes (Signed)
 PROGRESS NOTE    Pam Soto  QQV:[id number] DOB: 1990/11/07 DOA: 10/14/2023 PCP: Mechele Claude, MD    No chief complaint on file.   Brief Narrative:  Pam Soto is 33 y.o. female with history of IV drug abuse who presents with right-sided chest pain and shortness of breath.  Patient endorses muscle cramps over the past few days which she believes are secondary to depleted electrolytes.  She reports her pleuritic chest pain has become severe and thus presented to the hospital.  She reports she has been trying to abstain from illicit substances and has been taking methadone.  She was concerned that her musculoskeletal pain was a result of withdrawal and she injected fentanyl yesterday.  On arrival to the ED patient was febrile, saturating the mid 90s on room air, tachypneic, tachycardic.  Labs reveal hyponatremia to 129, hypokalemia to 2.9, hypomagnesemia 1.6, leukocytosis 12.4 K.  Respiratory viral panel was negative.  Chest x-ray concerning for multifocal pneumonia.  CTA concerning for multifocal infiltrates which may be septic emboli.  Blood cultures were collected, the patient was started on IV fluids.    Assessment & Plan:   Principal Problem:   Pneumonia Active Problems:   Hyponatremia   Hypokalemia   Hypomagnesemia   Prolonged QT interval   IV drug abuse (HCC)   Multifocal pneumonia   Gram-positive cocci bacteremia   Empyema lung (HCC)   Sepsis due to methicillin susceptible Staphylococcus aureus (HCC)   MSSA bacteremia   Necrotic pneumonia (HCC)   #1 loculated right-sided pleural effusion/empyema/multifocal pneumonia -Patient noted to have some shortness of breath, ongoing pleuritic chest pain, repeat CT chest done concerning for pleural effusion and worsening multilobar pneumonia. -Patient seen in consultation by PCCM underwent therapeutic and diagnostic thoracentesis 10/21/2023 per IR with 300 cc of amber-colored fluid extracted. -Cultures from thoracentesis  consistent with MSSA.   -By lights criteria exudative process. -Patient with MSSA bacteremia in the setting of IV drug use. -Pulmonary following and patient underwent repeat chest x-ray, 10/23/2023 with stable right pleural effusion with associated atelectasis.  -Patient with ongoing right pleuritic chest pain.  -Continue empiric IV antibiotics of Unasyn. -CT chest ordered per PCCM/pulmonary which showed moderate loculated right hydropneumothorax.  Pneumothorax component is small may be related to recent thoracentesis.  Complete opacification of right lower lobe, suspicious for pneumonia.  Bubbly air peripherally, unclear if this is within pleural fluid or lung parenchyma, raising concern for necrotic pneumonia-intraparenchymal.  Partial opacification of right middle lobe.  Small left pleural effusion is new.  Bandlike atelectasis in the left lower lobe. -Chest tube placed by pulmonary/PCCM on 10/24/2023 and patient underwent fibrinolytic treatment to date 10/25/2023. -Patient to receive pleural fibrinolytic treatment again today 10/26/2023. -Per pulmonary.  2.  MSSA bacteremia/multifocal pneumonia/necrotic pneumonia -Patient on admission noted to have multifocal infiltrates noted in right lower lobe on initial CT angiogram chest with concerns for septic emboli. -Repeat CT revealed worsening pleural effusion. -3/4 blood cultures positive for MSSA bacteremia. -Repeat cultures from 10/16/2023 negative to date. -2D echo and TEE negative for vegetation/endocarditis. 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 -Patient noted will need cefazolin for 4 weeks from 3/5 with EOT on/11/11/2023 followed by dose of oritavancin/dalbavancin on 11/12/2023 to complete a 6-week course of antibiotic treatment. -Due to current effusion/?  Empyema patient currently on IV Unasyn. -CT chest ordered  per PCCM/pulmonary which showed moderate loculated  right hydropneumothorax.  Pneumothorax component is small may be related to recent thoracentesis.  Complete opacification of right lower lobe, suspicious for pneumonia.  Bubbly air peripherally, unclear if this is within pleural fluid or lung parenchyma, raising concern for necrotic pneumonia-intraparenchymal.  Partial opacification of right middle lobe.  Small left pleural effusion is new.  Bandlike atelectasis in the left lower lobe.. -Due to patient's active IV drug use she is not a candidate for PICC therapy and will need to remain in the hospital in supervised setting for duration of treatment.  Patient noted to be amenable to this. -Continue incentive spirometry, flutter valve. -Patient reassessed by ID on 10/24/2023, MRI of the T and L-spine was obtained with no discitis or osteomyelitis noted. -ID recommended continuation of plan for at least 4 weeks of IV Unasyn from 3/5 with EOT/4/1 followed by 1 dose of oritavancin/dalbavancin on 4/2 to complete a 6-week course of antibiotic treatment. -ID was following but have signed off again as of 10/24/2023.   3.  Hep B nonimmune -Consider vaccination once acute illness has resolved.  4.  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 -Will need outpatient treatment. -Will defer to ID.  5.  Sepsis secondary to MSSA bacteremia, MSSA empyema and pneumonia -Patient met criteria for sepsis on arrival with leukocytosis, tachycardia, tachypnea, likely secondary to pneumonia, bacteremia, empyema. -Continue current empiric IV antibiotics as noted above. -See problem #1 and 2.  6.  Normocytic anemia/anemia of chronic disease/low vitamin B12 levels -Patient with no overt bleeding. -Hemoglobin stable at 9.1. -Anemia panel with iron of 13, TIBC of 234, ferritin of 96, folate 7.5, vitamin B12 266. -Due to low vitamin B12 levels patient started on vitamin B12 1000 mcg subcutaneously daily while in the hospital and will  transition to oral vitamin B12 on discharge.  -Follow H&H. -Transfusion threshold hemoglobin < 7.  7.  Polysubstance abuse/IV drug abuse -Patient noted to have almost daily use of heroin/fentanyl, cocaine. -Occasional use of benzodiazepines though not consistently.  Patient denies alcohol use. -Patient noted to be working towards abstinence and on methadone in outpatient setting. -Patient noted to use IV fentanyl the day prior to arrival. -Patient desires continued abstinence and would like medication assisted withdrawal while admitted admitted. -Polysubstance cessation stressed to patient. -Continue home regimen methadone. -TOC consulted for resource assistance.  8.  Right-sided pleuritis -Likely secondary to problem #1 and now with new chest tube placement. -Pain better controlled per patient. -Continue current regimen of methadone, gabapentin, Toradol, Tylenol, lidocaine patches, as needed oxycodone. -Continue treatment as improvement #1.  9.  Hypokalemia/hypomagnesemia -Repleted.   -Potassium at 3.8, magnesium at 2.1.   -Repeat labs in AM.  10.  Prolonged QT -Optimize electrolytes, avoid QT prolongation medications. -Repeat EKG 3/5 showed resolution of QT prolongation.  11.  Hyponatremia -Resolved.    12.  AKI, POA -Resolved.      DVT prophylaxis: Lovenox Code Status: Full Family Communication: Updated patient.  No family at bedside. Disposition: Home once IV antibiotics are completed and cleared by pulmonary and ID.  Status is: Inpatient Remains inpatient appropriate because: Severity of illness, need for IV antibiotics will need until/08/2023, not a candidate for PICC therapy.  Will need to remain in-house until at least IV antibiotics are completed.   Consultants:  PCCM/pulmonary: Dr. Isaiah Serge 10/21/2023 ID: Dr. Elinor Parkinson 10/15/2023, Dr. Thedore Mins 10/24/2023  Procedures:  CT angiogram chest 10/14/2023 CT chest with contrast 10/19/2023 Chest x-ray 10/14/2023, 10/21/2023,  10/23/2023 2D echo 10/15/2023 TEE 10/17/2023 Ultrasound-guided diagnostic and  therapeutic right-sided thoracentesis yielding 300 cc of amber-colored fluid: Per IR: Anders Grant, NP 10/21/2023 CT chest 10/23/2023 Chest tube placement by Dr. Merrily Pew 10/24/2023 MRI T and L-spine 10/24/2023  Antimicrobials:  Anti-infectives (From admission, onward)    Start     Dose/Rate Route Frequency Ordered Stop   10/19/23 1700  Ampicillin-Sulbactam (UNASYN) 3 g in sodium chloride 0.9 % 100 mL IVPB        3 g 200 mL/hr over 30 Minutes Intravenous Every 6 hours 10/19/23 1609     10/16/23 0915  ceFAZolin (ANCEF) IVPB 2g/100 mL premix  Status:  Discontinued        2 g 200 mL/hr over 30 Minutes Intravenous Every 8 hours 10/16/23 0826 10/19/23 1610   10/15/23 1000  vancomycin (VANCOCIN) IVPB 1000 mg/200 mL premix  Status:  Discontinued        1,000 mg 200 mL/hr over 60 Minutes Intravenous Every 12 hours 10/15/23 0406 10/16/23 0826   10/15/23 0600  cefTRIAXone (ROCEPHIN) 2 g in sodium chloride 0.9 % 100 mL IVPB  Status:  Discontinued        2 g 200 mL/hr over 30 Minutes Intravenous Every 24 hours 10/15/23 0354 10/16/23 0826   10/15/23 0400  doxycycline (VIBRAMYCIN) 100 mg in sodium chloride 0.9 % 250 mL IVPB  Status:  Discontinued        100 mg 125 mL/hr over 120 Minutes Intravenous Every 12 hours 10/15/23 0354 10/16/23 0826   10/14/23 2315  ceFEPIme (MAXIPIME) 2 g in sodium chloride 0.9 % 100 mL IVPB        2 g 200 mL/hr over 30 Minutes Intravenous  Once 10/14/23 2303 10/15/23 0109   10/14/23 2315  Vancomycin (VANCOCIN) 1,500 mg in sodium chloride 0.9 % 500 mL IVPB        1,500 mg 250 mL/hr over 120 Minutes Intravenous  Once 10/14/23 2304 10/15/23 0228         Subjective: Lying in bed watching television.  Still with complaints of right-sided chest pain.  Some shallow breaths.  Denies any abdominal pain.  Tolerating current diet.  States she saw pulmonologist this morning who was going to give a second  round of lytic therapy.  Objective: Vitals:   10/25/23 1757 10/25/23 2026 10/26/23 0730 10/26/23 1359  BP: 138/86 127/89 128/83 136/86  Pulse: 100 84 72 82  Resp:  16 17 17   Temp:  98.8 F (37.1 C) 98.1 F (36.7 C) 98.4 F (36.9 C)  TempSrc:  Oral  Oral  SpO2: 95% 95% 96% 95%  Weight:      Height:        Intake/Output Summary (Last 24 hours) at 10/26/2023 1627 Last data filed at 10/26/2023 1300 Gross per 24 hour  Intake 120 ml  Output 1590 ml  Net -1470 ml   Filed Weights   10/14/23 2300 10/17/23 1151  Weight: 81.6 kg 81.6 kg    Examination:  General exam: NAD. Respiratory system: Decreased breath sounds in the right base.  Coarse/rhonchorous breath sounds on the right.  No significant wheezing.  Fair air movement.  Speaking in full sentences.  No use of accessory muscles of respiration.  Chest tube in place on the right.   Cardiovascular system: RRR no murmurs rubs or gallops.  No JVD.  No lower extremity edema.  Gastrointestinal system: Abdomen is soft, nontender, nondistended, positive bowel sounds.  No rebound.  No guarding. Central nervous system: Alert and oriented.  Moving extremities spontaneously.  No focal  neurological deficits. Extremities: Symmetric 5 x 5 power. Skin: No rashes, lesions or ulcers Psychiatry: Judgement and insight appear normal. Mood & affect appropriate.     Data Reviewed: I have personally reviewed following labs and imaging studies  CBC: Recent Labs  Lab 10/21/23 0626 10/22/23 0536 10/23/23 1137 10/24/23 0415 10/25/23 0907 10/26/23 0852  WBC 7.7 9.3 11.5* 9.6 9.2 6.7  NEUTROABS 4.3 6.1 7.8* 6.9 5.9  --   HGB 9.6* 9.7* 10.9* 10.0* 9.3* 9.1*  HCT 28.7* 29.9* 33.3* 30.3* 28.6* 27.9*  MCV 87.5 89.3 89.5 87.1 89.7 88.3  PLT 420* 439* 479* 485* 480* 473*    Basic Metabolic Panel: Recent Labs  Lab 10/20/23 0326 10/21/23 0626 10/22/23 0536 10/23/23 1137 10/24/23 0415 10/25/23 0907 10/26/23 0852  NA 136   < > 135 138 135 136  139  K 4.0   < > 4.1 4.3 3.8 3.8 3.8  CL 103   < > 105 106 104 103 103  CO2 23   < > 23 19* 22 20* 24  GLUCOSE 88   < > 106* 78 93 94 89  BUN 8   < > 13 10 10 11 13   CREATININE 0.58   < > 0.79 0.62 0.63 0.56 0.49  CALCIUM 8.6*   < > 8.3* 8.7* 8.5* 8.3* 8.6*  MG 1.9   < > 2.1 2.1 1.9 1.8 2.1  PHOS 4.8*  --   --  4.7* 4.2  --   --    < > = values in this interval not displayed.    GFR: Estimated Creatinine Clearance: 113 mL/min (by C-G formula based on SCr of 0.49 mg/dL).  Liver Function Tests: Recent Labs  Lab 10/20/23 0326 10/21/23 0626 10/22/23 0536 10/23/23 1137 10/24/23 0415  AST 19 15 14* 17  --   ALT 11 10 10 13   --   ALKPHOS 67 61 66 77  --   BILITOT 0.3 0.6 0.3 0.3  --   PROT 6.6 6.1* 6.2* 7.5  --   ALBUMIN 2.2* 1.9* 1.9* 2.2* 2.0*    CBG: No results for input(s): "GLUCAP" in the last 168 hours.   Recent Results (from the past 240 hours)  MRSA Next Gen by PCR, Nasal     Status: None   Collection Time: 10/19/23  4:03 PM   Specimen: Nasal Mucosa; Nasal Swab  Result Value Ref Range Status   MRSA by PCR Next Gen NOT DETECTED NOT DETECTED Final    Comment: (NOTE) The GeneXpert MRSA Assay (FDA approved for NASAL specimens only), is one component of a comprehensive MRSA colonization surveillance program. It is not intended to diagnose MRSA infection nor to guide or monitor treatment for MRSA infections. Test performance is not FDA approved in patients less than 32 years old. Performed at [facility], [address], [place], [place] [address]   Fungus Culture With Stain     Status: None (Preliminary result)   Collection Time: 10/21/23 11:22 AM   Specimen: Lung, Right; Pleural Fluid  Result Value Ref Range Status   Fungus Stain Final report  Final    Comment: (NOTE) Performed At: [facility] [address] [place], [place] [id number] Jolene Schimke MD QQ:[id number]    Fungus (Mycology) Culture PENDING  Incomplete   Fungal Source PLEURAL  Final     Comment: Performed at [facility], [address] [place], [place] [address]  Acid Fast Smear (AFB)     Status: None  Collection Time: 10/21/23 11:22 AM   Specimen: Lung, Right; Pleural Fluid  Result Value Ref Range Status   AFB Specimen Processing Direct Inoculation  Final   Acid Fast Smear Negative  Final    Comment: (NOTE) Performed At: [facility] [address] [place], [place] [id number] Jolene Schimke MD JO:[id number]    Source (AFB) PLEURAL  Final    Comment: Performed at [facility], [address] [place], [place] [address]  Body fluid culture w Gram Stain     Status: None   Collection Time: 10/21/23 11:22 AM   Specimen: Lung, Right; Pleural Fluid  Result Value Ref Range Status   Specimen Description PLEURAL  Final   Special Requests NONE  Final   Gram Stain NO WBC SEEN NO ORGANISMS SEEN   Final   Culture   Final    RARE STAPHYLOCOCCUS AUREUS CRITICAL RESULT CALLED TO, READ BACK BY AND VERIFIED WITH: RN L.YANG AT 1305 ON 10/22/2023 BY T.SAAD. Performed at [facility], [address], [place], [place] [address]    Report Status 10/25/2023 FINAL  Final   Organism ID, Bacteria STAPHYLOCOCCUS AUREUS  Final      Susceptibility   Staphylococcus aureus - MIC*    CIPROFLOXACIN >=8 RESISTANT Resistant     ERYTHROMYCIN >=8 RESISTANT Resistant     GENTAMICIN <=0.5 SENSITIVE Sensitive     OXACILLIN 0.5 SENSITIVE Sensitive     TETRACYCLINE <=1 SENSITIVE Sensitive     VANCOMYCIN 1 SENSITIVE Sensitive     TRIMETH/SULFA >=320 RESISTANT Resistant     CLINDAMYCIN <=0.25 SENSITIVE Sensitive     RIFAMPIN <=0.5 SENSITIVE Sensitive     Inducible Clindamycin NEGATIVE Sensitive     LINEZOLID 2 SENSITIVE Sensitive     * RARE STAPHYLOCOCCUS AUREUS  Fungus Culture Result     Status: None   Collection Time: 10/21/23 11:22 AM  Result Value Ref Range Status   Result 1 Comment  Final    Comment: (NOTE) KOH/Calcofluor preparation:  no fungus  observed. Performed At: [facility] [address] [place], [place] [id number] Jolene Schimke MD HC:[id number]   Culture, blood (Routine X 2) w Reflex to ID Panel     Status: None (Preliminary result)   Collection Time: 10/25/23  9:05 AM   Specimen: BLOOD LEFT HAND  Result Value Ref Range Status   Specimen Description BLOOD LEFT HAND  Final   Special Requests   Final    BOTTLES DRAWN AEROBIC AND ANAEROBIC Blood Culture results may not be optimal due to an inadequate volume of blood received in culture bottles   Culture   Final    NO GROWTH < 24 HOURS Performed at [facility], [address] [place], [place] [address]    Report Status PENDING  Incomplete  Culture, blood (Routine X 2) w Reflex to ID Panel     Status: None (Preliminary result)   Collection Time: 10/25/23  9:06 AM   Specimen: BLOOD LEFT ARM  Result Value Ref Range Status   Specimen Description BLOOD LEFT ARM  Final   Special Requests   Final    BOTTLES DRAWN AEROBIC AND ANAEROBIC Blood Culture results may not be optimal due to an inadequate volume of blood received in culture bottles   Culture   Final    NO GROWTH < 24 HOURS Performed at [facility], [address], [place], [place] [address]    Report Status PENDING  Incomplete  Radiology Studies: DG CHEST PORT 1 VIEW Result Date: 10/26/2023 CLINICAL DATA:  Loculated pleural effusion EXAM: PORTABLE CHEST 1 VIEW COMPARISON:  Chest radiographs 10/25/2023, 10/24/2023, 10/23/2023; CT chest 10/23/2023 FINDINGS: Redemonstration of inferior right pigtail drainage catheter. There is improved aeration of the right lower hemithorax, likely a combination of continued decrease in right pleural fluid and improved lung aeration. Note is made of a markedly loculated posterior right pleural effusion on prior 10/23/2023 CT. Minimal left basilar linear subsegmental atelectasis is unchanged. The cardiac silhouette and mediastinal contours are within  normal limits for AP technique. Note is made of a small amount of right pleural air on prior 10/23/2023 CT. This is again not well visualized on radiograph. No acute skeletal abnormality. IMPRESSION: Redemonstration of inferior right pigtail drainage catheter. There is improved aeration of the right lower hemithorax compared to 10/25/2023 and 10/24/2023, likely a combination of continued decrease in right pleural fluid and improved lung aeration. Note is made of a markedly loculated posterior right pleural effusion on prior 10/23/2023 CT. Electronically Signed   By: Neita Garnet M.D.   On: 10/26/2023 09:20   DG CHEST PORT 1 VIEW Result Date: 10/25/2023 CLINICAL DATA:  Follow-up loculated right pleural effusion. EXAM: PORTABLE CHEST 1 VIEW COMPARISON:  10/24/2023 and 10/23/2023. FINDINGS: Stable right inferior hemithorax chest tube. Hazy opacity persists along the right mid lung with more confluent opacity at the right lung base. Milder linear opacities noted at the left lung base. Remainder of the left lung is clear. No pneumothorax. IMPRESSION: 1. No significant change from the previous day's study. 2. Stable right chest tube. No pneumothorax. 3. Persistent right mid and lower lung opacity consistent with a combination of pleural fluid and atelectasis/pneumonia. Electronically Signed   By: Amie Portland M.D.   On: 10/25/2023 11:47   DG CHEST PORT 1 VIEW Result Date: 10/24/2023 CLINICAL DATA:  Chest tube placement EXAM: PORTABLE CHEST 1 VIEW COMPARISON:  10/23/2023 FINDINGS: Interval placement of right basilar chest tube. No pneumothorax. Moderate right pleural effusion, slightly decreased since prior study. Diffuse right lung airspace disease, worsening since prior study. No confluent opacity on the left. IMPRESSION: Interval placement of right chest tube, no pneumothorax. Continued moderate right pleural effusion. Diffuse right lung airspace disease, worsening since prior study. Electronically Signed   By:  Charlett Nose M.D.   On: 10/24/2023 22:32        Scheduled Meds:  acetaminophen  1,000 mg Oral Q6H   cyanocobalamin  1,000 mcg Subcutaneous Daily   enoxaparin (LOVENOX) injection  40 mg Subcutaneous Q24H   gabapentin  300 mg Oral TID   lidocaine  1 patch Transdermal Q24H   methadone  40 mg Oral Daily   nicotine  21 mg Transdermal Daily   pantoprazole  40 mg Oral Daily   polyethylene glycol  17 g Oral BID   senna-docusate  1 tablet Oral BID   sodium chloride flush  10 mL Intrapleural Q8H   sodium chloride flush  10 mL Intrapleural Q8H   sodium chloride flush  3 mL Intravenous Q12H   Continuous Infusions:  ampicillin-sulbactam (UNASYN) IV 3 g (10/26/23 1107)     LOS: 11 days    Time spent: 35 minutes    Ramiro Harvest, MD [facility]   To contact the attending provider between 7A-7P or the covering provider during after hours 7P-7A, please log into the web site [email or web address] and access using universal [place] password for that web site. If you do not have  the password, please call the hospital operator.  10/26/2023, 4:27 PM

## 2023-10-26 NOTE — Plan of Care (Signed)
  Problem: Health Behavior/Discharge Planning: Goal: Ability to manage health-related needs will improve Outcome: Progressing   Problem: Clinical Measurements: Goal: Ability to maintain clinical measurements within normal limits will improve Outcome: Progressing   Problem: Coping: Goal: Level of anxiety will decrease Outcome: Progressing   

## 2023-10-27 ENCOUNTER — Inpatient Hospital Stay (HOSPITAL_COMMUNITY): Payer: MEDICAID

## 2023-10-27 DIAGNOSIS — J869 Pyothorax without fistula: Secondary | ICD-10-CM | POA: Diagnosis not present

## 2023-10-27 DIAGNOSIS — F1910 Other psychoactive substance abuse, uncomplicated: Secondary | ICD-10-CM | POA: Diagnosis not present

## 2023-10-27 DIAGNOSIS — J15211 Pneumonia due to Methicillin susceptible Staphylococcus aureus: Secondary | ICD-10-CM | POA: Diagnosis not present

## 2023-10-27 DIAGNOSIS — R7881 Bacteremia: Secondary | ICD-10-CM | POA: Diagnosis not present

## 2023-10-27 LAB — RENAL FUNCTION PANEL
Albumin: 2.1 g/dL — ABNORMAL LOW (ref 3.5–5.0)
Anion gap: 11 (ref 5–15)
BUN: 8 mg/dL (ref 6–20)
CO2: 23 mmol/L (ref 22–32)
Calcium: 8.7 mg/dL — ABNORMAL LOW (ref 8.9–10.3)
Chloride: 105 mmol/L (ref 98–111)
Creatinine, Ser: 0.58 mg/dL (ref 0.44–1.00)
GFR, Estimated: >60 mL/min (ref >60)
Glucose, Bld: 80 mg/dL (ref 70–99)
Phosphorus: 4.6 mg/dL (ref 2.5–4.6)
Potassium: 3.8 mmol/L (ref 3.5–5.1)
Sodium: 139 mmol/L (ref 135–145)

## 2023-10-27 LAB — MAGNESIUM: Magnesium: 2 mg/dL (ref 1.7–2.4)

## 2023-10-27 LAB — CBC WITH DIFFERENTIAL/PLATELET
Abs Immature Granulocytes: 0.02 10*3/uL (ref 0.00–0.07)
Basophils Absolute: 0 10*3/uL (ref 0.0–0.1)
Basophils Relative: 1 %
Eosinophils Absolute: 0.2 10*3/uL (ref 0.0–0.5)
Eosinophils Relative: 3 %
HCT: 30.9 % — ABNORMAL LOW (ref 36.0–46.0)
Hemoglobin: 9.9 g/dL — ABNORMAL LOW (ref 12.0–15.0)
Immature Granulocytes: 0 %
Lymphocytes Relative: 43 %
Lymphs Abs: 2.5 10*3/uL (ref 0.7–4.0)
MCH: 28.6 pg (ref 26.0–34.0)
MCHC: 32 g/dL (ref 30.0–36.0)
MCV: 89.3 fL (ref 80.0–100.0)
Monocytes Absolute: 0.4 10*3/uL (ref 0.1–1.0)
Monocytes Relative: 7 %
Neutro Abs: 2.7 10*3/uL (ref 1.7–7.7)
Neutrophils Relative %: 46 %
Platelets: 564 10*3/uL — ABNORMAL HIGH (ref 150–400)
RBC: 3.46 MIL/uL — ABNORMAL LOW (ref 3.87–5.11)
RDW: 12.3 % (ref 11.5–15.5)
WBC: 5.7 10*3/uL (ref 4.0–10.5)
nRBC: 0 % (ref 0.0–0.2)

## 2023-10-27 MED ORDER — STERILE WATER FOR INJECTION IJ SOLN
5.0000 mg | Freq: Once | RESPIRATORY_TRACT | Status: AC
  Administered 2023-10-27: 5 mg via INTRAPLEURAL
  Filled 2023-10-27: qty 5

## 2023-10-27 MED ORDER — KETOROLAC TROMETHAMINE 30 MG/ML IJ SOLN
30.0000 mg | Freq: Four times a day (QID) | INTRAMUSCULAR | Status: AC
  Administered 2023-10-27 – 2023-11-01 (×20): 30 mg via INTRAVENOUS
  Filled 2023-10-27 (×20): qty 1

## 2023-10-27 MED ORDER — SODIUM CHLORIDE (PF) 0.9 % IJ SOLN
10.0000 mg | Freq: Once | INTRAMUSCULAR | Status: AC
  Administered 2023-10-27: 10 mg via INTRAPLEURAL
  Filled 2023-10-27: qty 10

## 2023-10-27 NOTE — TOC Progression Note (Signed)
 Transition of Care [facility]) - Progression Note    Patient Details  Name: Pam Soto MRN: [id number] Date of Birth: 1991/03/13  Transition of [facility] CM/SW Contact  Ronny Bacon, RN Phone Number: 10/27/2023, 3:21 PM  Clinical Narrative:   Progression rounds, patient will need IV abx for total of 6 weeks ending on 4/2. Patient has history of IVDU and is not a candidate for IV home abx infusion.     Barriers to Discharge: Continued Medical Work up  Expected Discharge Plan and Services                                               Social Determinants of Health (SDOH) Interventions SDOH Screenings   Food Insecurity: No Food Insecurity (10/15/2023)  Housing: High Risk (10/15/2023)  Transportation Needs: Unmet Transportation Needs (10/15/2023)  Utilities: At Risk (10/15/2023)  Financial Resource Strain: Medium Risk (02/18/2023)   Received from [facility]  Social Connections: Moderately Integrated (10/15/2023)  Tobacco Use: High Risk (10/17/2023)  Health Literacy: Low Risk  (02/15/2021)   Received from [facility]    Readmission Risk Interventions     No data to display

## 2023-10-27 NOTE — Plan of Care (Signed)
  Problem: Clinical Measurements: Goal: Diagnostic test results will improve Outcome: Progressing Goal: Respiratory complications will improve Outcome: Progressing   Problem: Activity: Goal: Risk for activity intolerance will decrease Outcome: Progressing   

## 2023-10-27 NOTE — Plan of Care (Signed)

## 2023-10-27 NOTE — Procedures (Signed)
 Pleural Fibrinolytic Administration Procedure Note  Pam Soto  [id number]  1990/11/03  Date:10/27/23  Time:2:28 PM   Provider Performing:Amram Maya   Procedure: Pleural Fibrinolysis Subsequent day (44010)  Indication(s) Fibrinolysis of complicated pleural effusion  Consent Risks of the procedure as well as the alternatives and risks of each were explained to the patient and/or caregiver.  Consent for the procedure was obtained.   Anesthesia None   Time Out Verified patient identification, verified procedure, site/side was marked, verified correct patient position, special equipment/implants available, medications/allergies/relevant history reviewed, required imaging and test results available.   Sterile Technique Hand hygiene, gloves   Procedure Description Existing pleural catheter was cleaned and accessed in sterile manner.  10mg  of tPA in 30cc of saline and 5mg  of dornase in 30cc of sterile water were injected into pleural space using existing pleural catheter.  Catheter will be clamped for 1 hour and then placed back to suction.   Complications/Tolerance None; patient tolerated the procedure well.   EBL None   Specimen(s) None  [name] MD [facility] See Amion for pager  If no response to pager , please call [phone] until 7pm After 7:00 pm call Elink  [phone] 10/27/2023, 2:28 PM

## 2023-10-27 NOTE — Progress Notes (Signed)
 PROGRESS NOTE    Pam Soto  ONG:[id number] DOB: November 23, 1990 DOA: 10/14/2023 PCP: Mechele Claude, MD    No chief complaint on file.   Brief Narrative:  Pam Soto is 33 y.o. female with history of IV drug abuse who presents with right-sided chest pain and shortness of breath.  Patient endorses muscle cramps over the past few days which she believes are secondary to depleted electrolytes.  She reports her pleuritic chest pain has become severe and thus presented to the hospital.  She reports she has been trying to abstain from illicit substances and has been taking methadone.  She was concerned that her musculoskeletal pain was a result of withdrawal and she injected fentanyl yesterday.  On arrival to the ED patient was febrile, saturating the mid 90s on room air, tachypneic, tachycardic.  Labs reveal hyponatremia to 129, hypokalemia to 2.9, hypomagnesemia 1.6, leukocytosis 12.4 K.  Respiratory viral panel was negative.  Chest x-ray concerning for multifocal pneumonia.  CTA concerning for multifocal infiltrates which may be septic emboli.  Blood cultures were collected, the patient was started on IV fluids.    Assessment & Plan:   Principal Problem:   Pneumonia Active Problems:   Hyponatremia   Hypokalemia   Hypomagnesemia   Prolonged QT interval   IV drug abuse (HCC)   Multifocal pneumonia   Gram-positive cocci bacteremia   Empyema lung (HCC)   Sepsis due to methicillin susceptible Staphylococcus aureus (HCC)   MSSA bacteremia   Necrotic pneumonia (HCC)   #1 loculated right-sided pleural effusion/empyema/multifocal pneumonia -Patient noted to have some shortness of breath, ongoing pleuritic chest pain, repeat CT chest done concerning for pleural effusion and worsening multilobar pneumonia. -Patient seen in consultation by PCCM underwent therapeutic and diagnostic thoracentesis 10/21/2023 per IR with 300 cc of amber-colored fluid extracted. -Cultures from thoracentesis  consistent with MSSA.   -By lights criteria exudative process. -Patient with MSSA bacteremia in the setting of IV drug use. -Pulmonary following and patient underwent repeat chest x-ray, 10/23/2023 with stable right pleural effusion with associated atelectasis.  -Patient with ongoing right pleuritic chest pain.  -Continue empiric IV antibiotics of Unasyn. -CT chest ordered per PCCM/pulmonary which showed moderate loculated right hydropneumothorax.  Pneumothorax component is small may be related to recent thoracentesis.  Complete opacification of right lower lobe, suspicious for pneumonia.  Bubbly air peripherally, unclear if this is within pleural fluid or lung parenchyma, raising concern for necrotic pneumonia-intraparenchymal.  Partial opacification of right middle lobe.  Small left pleural effusion is new.  Bandlike atelectasis in the left lower lobe. -Chest tube placed by pulmonary/PCCM on 10/24/2023 and patient underwent fibrinolytic treatment on 10/25/2023, 10/26/2023. -Patient to undergo a pleural lytic therapy again today (10/27/2023) per pulmonary. -Per pulmonary.  2.  MSSA bacteremia/multifocal pneumonia/necrotic pneumonia -Patient on admission noted to have multifocal infiltrates noted in right lower lobe on initial CT angiogram chest with concerns for septic emboli. -Repeat CT revealed worsening pleural effusion. -3/4 blood cultures positive for MSSA bacteremia. -Repeat cultures from 10/16/2023 negative to date. -2D echo and TEE negative for vegetation/endocarditis. 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 -Patient noted will need cefazolin for 4 weeks from 3/5 with EOT on/11/11/2023 followed by dose of oritavancin/dalbavancin on 11/12/2023 to complete a 6-week course of antibiotic treatment. -Due to current effusion/?  Empyema patient currently on IV Unasyn. -CT  chest ordered per PCCM/pulmonary which  showed moderate loculated right hydropneumothorax.  Pneumothorax component is small may be related to recent thoracentesis.  Complete opacification of right lower lobe, suspicious for pneumonia.  Bubbly air peripherally, unclear if this is within pleural fluid or lung parenchyma, raising concern for necrotic pneumonia-intraparenchymal.  Partial opacification of right middle lobe.  Small left pleural effusion is new.  Bandlike atelectasis in the left lower lobe.. -Due to patient's active IV drug use she is not a candidate for PICC therapy and will need to remain in the hospital in supervised setting for duration of treatment.  Patient noted to be amenable to this. -Continue incentive spirometry, flutter valve. -Patient reassessed by ID on 10/24/2023, MRI of the T and L-spine was obtained with no discitis or osteomyelitis noted. -ID recommended continuation of plan for at least 4 weeks of IV Unasyn from 3/5 with EOT/4/1 followed by 1 dose of oritavancin/dalbavancin on 4/2 to complete a 6-week course of antibiotic treatment. -ID was following but have signed off again as of 10/24/2023.   3.  Hep B nonimmune -Consider vaccination once acute illness has resolved.  4.  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 -Will need outpatient treatment. -Will defer to ID.  5.  Sepsis secondary to MSSA bacteremia, MSSA empyema and pneumonia -Patient met criteria for sepsis on arrival with leukocytosis, tachycardia, tachypnea, likely secondary to pneumonia, bacteremia, empyema. -Continue current empiric IV antibiotics as noted above. -See problem #1 and 2.  6.  Normocytic anemia/anemia of chronic disease/low vitamin B12 levels -Patient with no overt bleeding. -Hemoglobin stable at 9.9. -Anemia panel with iron of 13, TIBC of 234, ferritin of 96, folate 7.5, vitamin B12 266. -Due to low vitamin B12 levels patient started on vitamin B12 1000 mcg subcutaneously daily while in the  hospital and will transition to oral vitamin B12 on discharge.  -Follow H&H. -Transfusion threshold hemoglobin < 7.  7.  Polysubstance abuse/IV drug abuse -Patient noted to have almost daily use of heroin/fentanyl, cocaine. -Occasional use of benzodiazepines though not consistently.  Patient denies alcohol use. -Patient noted to be working towards abstinence and on methadone in outpatient setting. -Patient noted to use IV fentanyl the day prior to arrival. -Patient desires continued abstinence and would like medication assisted withdrawal while admitted admitted. -Polysubstance cessation stressed to patient. -Continue home regimen methadone. -TOC consulted for resource assistance.  8.  Right-sided pleuritis -Likely secondary to problem #1 and now with new chest tube placement. -Pain better controlled per patient. -Continue current regimen of methadone, gabapentin, Tylenol, lidocaine patches, as needed oxycodone. -Will change Toradol to 30 mg every 6 hours scheduled for the next 5 days. -Continue treatment as improvement #1.  9.  Hypokalemia/hypomagnesemia -Repleted.   -Potassium at 3.8, magnesium at 2.0.   -Phosphorus at 4.6. -Repeat labs in AM.  10.  Prolonged QT -Optimize electrolytes, avoid QT prolongation medications. -Repeat EKG 3/5 showed resolution of QT prolongation.  11.  Hyponatremia -Resolved.    12.  AKI, POA -Resolved.      DVT prophylaxis: Lovenox Code Status: Full Family Communication: Updated patient and boyfriend at bedside.   Disposition: Home once IV antibiotics are completed and cleared by pulmonary and ID.  Status is: Inpatient Remains inpatient appropriate because: Severity of illness, need for IV antibiotics will need until/08/2023, not a candidate for PICC therapy.  Will need to remain in-house until at least IV antibiotics are completed.   Consultants:  PCCM/pulmonary: Dr. Isaiah Serge 10/21/2023 ID: Dr. Elinor Parkinson 10/15/2023, Dr. Thedore Mins  10/24/2023  Procedures:  CT  angiogram chest 10/14/2023 CT chest with contrast 10/19/2023 Chest x-ray 10/14/2023, 10/21/2023, 10/23/2023 2D echo 10/15/2023 TEE 10/17/2023 Ultrasound-guided diagnostic and therapeutic right-sided thoracentesis yielding 300 cc of amber-colored fluid: Per IR: Anders Grant, NP 10/21/2023 CT chest 10/23/2023 Chest tube placement by Dr. Merrily Pew 10/24/2023 MRI T and L-spine 10/24/2023  Antimicrobials:  Anti-infectives (From admission, onward)    Start     Dose/Rate Route Frequency Ordered Stop   10/19/23 1700  Ampicillin-Sulbactam (UNASYN) 3 g in sodium chloride 0.9 % 100 mL IVPB        3 g 200 mL/hr over 30 Minutes Intravenous Every 6 hours 10/19/23 1609     10/16/23 0915  ceFAZolin (ANCEF) IVPB 2g/100 mL premix  Status:  Discontinued        2 g 200 mL/hr over 30 Minutes Intravenous Every 8 hours 10/16/23 0826 10/19/23 1610   10/15/23 1000  vancomycin (VANCOCIN) IVPB 1000 mg/200 mL premix  Status:  Discontinued        1,000 mg 200 mL/hr over 60 Minutes Intravenous Every 12 hours 10/15/23 0406 10/16/23 0826   10/15/23 0600  cefTRIAXone (ROCEPHIN) 2 g in sodium chloride 0.9 % 100 mL IVPB  Status:  Discontinued        2 g 200 mL/hr over 30 Minutes Intravenous Every 24 hours 10/15/23 0354 10/16/23 0826   10/15/23 0400  doxycycline (VIBRAMYCIN) 100 mg in sodium chloride 0.9 % 250 mL IVPB  Status:  Discontinued        100 mg 125 mL/hr over 120 Minutes Intravenous Every 12 hours 10/15/23 0354 10/16/23 0826   10/14/23 2315  ceFEPIme (MAXIPIME) 2 g in sodium chloride 0.9 % 100 mL IVPB        2 g 200 mL/hr over 30 Minutes Intravenous  Once 10/14/23 2303 10/15/23 0109   10/14/23 2315  Vancomycin (VANCOCIN) 1,500 mg in sodium chloride 0.9 % 500 mL IVPB        1,500 mg 250 mL/hr over 120 Minutes Intravenous  Once 10/14/23 2304 10/15/23 0228         Subjective: Few shortness of breath is improving.  Still with right-sided pleuritic pain and states pain is worse with lytic  treatments.  Denies any substernal chest pain.  Tolerating current diet.  Stated having bowel movements.   Objective: Vitals:   10/26/23 1359 10/26/23 2019 10/27/23 0321 10/27/23 1528  BP: 136/86 (!) 123/90 94/61 112/70  Pulse: 82 94 72 93  Resp: 17 16 16    Temp: 98.4 F (36.9 C) 98.8 F (37.1 C) 98.8 F (37.1 C) 97.8 F (36.6 C)  TempSrc: Oral   Oral  SpO2: 95% 95% 97% 97%  Weight:      Height:        Intake/Output Summary (Last 24 hours) at 10/27/2023 1651 Last data filed at 10/27/2023 1149 Gross per 24 hour  Intake 140 ml  Output 800 ml  Net -660 ml   Filed Weights   10/14/23 2300 10/17/23 1151  Weight: 81.6 kg 81.6 kg    Examination:  General exam: NAD. Respiratory system: Decreased breath sounds in the right base.  Coarse/rhonchorous breath sounds improving.  No wheezing.  Fair air movement.  Speaking in full sentences.  No use of accessory muscles of respiration.  Chest tube in place on the right. Cardiovascular system: Regular rate rhythm no murmurs rubs or gallops.  No JVD.  No lower extremity edema.  Gastrointestinal system: Abdomen is soft, nontender, nondistended, positive bowel sounds.  No rebound.  No  guarding. Central nervous system: Alert and oriented.  Moving extremities spontaneously.  No focal neurological deficits. Extremities: Symmetric 5 x 5 power. Skin: No rashes, lesions or ulcers Psychiatry: Judgement and insight appear normal. Mood & affect appropriate.     Data Reviewed: I have personally reviewed following labs and imaging studies  CBC: Recent Labs  Lab 10/22/23 0536 10/23/23 1137 10/24/23 0415 10/25/23 0907 10/26/23 0852 10/27/23 0849  WBC 9.3 11.5* 9.6 9.2 6.7 5.7  NEUTROABS 6.1 7.8* 6.9 5.9  --  2.7  HGB 9.7* 10.9* 10.0* 9.3* 9.1* 9.9*  HCT 29.9* 33.3* 30.3* 28.6* 27.9* 30.9*  MCV 89.3 89.5 87.1 89.7 88.3 89.3  PLT 439* 479* 485* 480* 473* 564*    Basic Metabolic Panel: Recent Labs  Lab 10/23/23 1137 10/24/23 0415  10/25/23 0907 10/26/23 0852 10/27/23 0849  NA 138 135 136 139 139  K 4.3 3.8 3.8 3.8 3.8  CL 106 104 103 103 105  CO2 19* 22 20* 24 23  GLUCOSE 78 93 94 89 80  BUN 10 10 11 13 8   CREATININE 0.62 0.63 0.56 0.49 0.58  CALCIUM 8.7* 8.5* 8.3* 8.6* 8.7*  MG 2.1 1.9 1.8 2.1 2.0  PHOS 4.7* 4.2  --   --  4.6    GFR: Estimated Creatinine Clearance: 113 mL/min (by C-G formula based on SCr of 0.58 mg/dL).  Liver Function Tests: Recent Labs  Lab 10/21/23 0626 10/22/23 0536 10/23/23 1137 10/24/23 0415 10/27/23 0849  AST 15 14* 17  --   --   ALT 10 10 13   --   --   ALKPHOS 61 66 77  --   --   BILITOT 0.6 0.3 0.3  --   --   PROT 6.1* 6.2* 7.5  --   --   ALBUMIN 1.9* 1.9* 2.2* 2.0* 2.1*    CBG: No results for input(s): "GLUCAP" in the last 168 hours.   Recent Results (from the past 240 hours)  MRSA Next Gen by PCR, Nasal     Status: None   Collection Time: 10/19/23  4:03 PM   Specimen: Nasal Mucosa; Nasal Swab  Result Value Ref Range Status   MRSA by PCR Next Gen NOT DETECTED NOT DETECTED Final    Comment: (NOTE) The GeneXpert MRSA Assay (FDA approved for NASAL specimens only), is one component of a comprehensive MRSA colonization surveillance program. It is not intended to diagnose MRSA infection nor to guide or monitor treatment for MRSA infections. Test performance is not FDA approved in patients less than 85 years old. Performed at [facility], [address], [place], [place] [address]   Fungus Culture With Stain     Status: None (Preliminary result)   Collection Time: 10/21/23 11:22 AM   Specimen: Lung, Right; Pleural Fluid  Result Value Ref Range Status   Fungus Stain Final report  Final    Comment: (NOTE) Performed At: [facility] [address] [place], [place] [id number] Jolene Schimke MD [id number]    Fungus (Mycology) Culture PENDING  Incomplete   Fungal Source PLEURAL  Final    Comment: Performed at [facility], [address],  [place], [place] [address]  Acid Fast Smear (AFB)     Status: None   Collection Time: 10/21/23 11:22 AM   Specimen: Lung, Right; Pleural Fluid  Result Value Ref Range Status   AFB Specimen Processing Direct Inoculation  Final   Acid Fast Smear Negative  Final    Comment: (NOTE) Performed At:  [facility] [address] [place], [place] [id number] Jolene Schimke MD WU:[id number]    Source (AFB) PLEURAL  Final    Comment: Performed at [facility], [address], [place], [place] [address]  Body fluid culture w Gram Stain     Status: None   Collection Time: 10/21/23 11:22 AM   Specimen: Lung, Right; Pleural Fluid  Result Value Ref Range Status   Specimen Description PLEURAL  Final   Special Requests NONE  Final   Gram Stain NO WBC SEEN NO ORGANISMS SEEN   Final   Culture   Final    RARE STAPHYLOCOCCUS AUREUS CRITICAL RESULT CALLED TO, READ BACK BY AND VERIFIED WITH: RN L.YANG AT 1305 ON 10/22/2023 BY T.SAAD. Performed at [facility], [address] [place], [place] [address]    Report Status 10/25/2023 FINAL  Final   Organism ID, Bacteria STAPHYLOCOCCUS AUREUS  Final      Susceptibility   Staphylococcus aureus - MIC*    CIPROFLOXACIN >=8 RESISTANT Resistant     ERYTHROMYCIN >=8 RESISTANT Resistant     GENTAMICIN <=0.5 SENSITIVE Sensitive     OXACILLIN 0.5 SENSITIVE Sensitive     TETRACYCLINE <=1 SENSITIVE Sensitive     VANCOMYCIN 1 SENSITIVE Sensitive     TRIMETH/SULFA >=320 RESISTANT Resistant     CLINDAMYCIN <=0.25 SENSITIVE Sensitive     RIFAMPIN <=0.5 SENSITIVE Sensitive     Inducible Clindamycin NEGATIVE Sensitive     LINEZOLID 2 SENSITIVE Sensitive     * RARE STAPHYLOCOCCUS AUREUS  Fungus Culture Result     Status: None   Collection Time: 10/21/23 11:22 AM  Result Value Ref Range Status   Result 1 Comment  Final    Comment: (NOTE) KOH/Calcofluor preparation:  no fungus observed. Performed At: [facility] [address] [place], [place]  [id number] Jolene Schimke MD MW:[id number]   Culture, blood (Routine X 2) w Reflex to ID Panel     Status: None (Preliminary result)   Collection Time: 10/25/23  9:05 AM   Specimen: BLOOD LEFT HAND  Result Value Ref Range Status   Specimen Description BLOOD LEFT HAND  Final   Special Requests   Final    BOTTLES DRAWN AEROBIC AND ANAEROBIC Blood Culture results may not be optimal due to an inadequate volume of blood received in culture bottles   Culture   Final    NO GROWTH 2 DAYS Performed at [facility], [address] [place], [place] [address]    Report Status PENDING  Incomplete  Culture, blood (Routine X 2) w Reflex to ID Panel     Status: None (Preliminary result)   Collection Time: 10/25/23  9:06 AM   Specimen: BLOOD LEFT ARM  Result Value Ref Range Status   Specimen Description BLOOD LEFT ARM  Final   Special Requests   Final    BOTTLES DRAWN AEROBIC AND ANAEROBIC Blood Culture results may not be optimal due to an inadequate volume of blood received in culture bottles   Culture   Final    NO GROWTH 2 DAYS Performed at [facility], [address] [place], [place] [address]    Report Status PENDING  Incomplete         Radiology Studies: DG CHEST PORT 1 VIEW Result Date: 10/27/2023 CLINICAL DATA:  Pleural effusion.  Right chest tube. EXAM: PORTABLE CHEST 1 VIEW COMPARISON:  Radiographs 10/26/2023 and 10/25/2023. Chest CT 10/23/2023. FINDINGS: 0533 hours. Small caliber  right pleural pigtail drainage catheter is unchanged in position. Probable minimal residual pneumothorax laterally at the right lung base. Small right pleural effusion has nearly completely resolved. Patchy bibasilar pulmonary opacities are unchanged. The heart size and mediastinal contours are stable. No acute osseous findings are seen. IMPRESSION: Near complete resolution of right pleural effusion with probable minimal residual pneumothorax laterally at the right lung base. Stable patchy  bibasilar pulmonary opacities. Electronically Signed   By: Carey Bullocks M.D.   On: 10/27/2023 09:32   DG CHEST PORT 1 VIEW Result Date: 10/26/2023 CLINICAL DATA:  Loculated pleural effusion EXAM: PORTABLE CHEST 1 VIEW COMPARISON:  Chest radiographs 10/25/2023, 10/24/2023, 10/23/2023; CT chest 10/23/2023 FINDINGS: Redemonstration of inferior right pigtail drainage catheter. There is improved aeration of the right lower hemithorax, likely a combination of continued decrease in right pleural fluid and improved lung aeration. Note is made of a markedly loculated posterior right pleural effusion on prior 10/23/2023 CT. Minimal left basilar linear subsegmental atelectasis is unchanged. The cardiac silhouette and mediastinal contours are within normal limits for AP technique. Note is made of a small amount of right pleural air on prior 10/23/2023 CT. This is again not well visualized on radiograph. No acute skeletal abnormality. IMPRESSION: Redemonstration of inferior right pigtail drainage catheter. There is improved aeration of the right lower hemithorax compared to 10/25/2023 and 10/24/2023, likely a combination of continued decrease in right pleural fluid and improved lung aeration. Note is made of a markedly loculated posterior right pleural effusion on prior 10/23/2023 CT. Electronically Signed   By: Neita Garnet M.D.   On: 10/26/2023 09:20        Scheduled Meds:  acetaminophen  1,000 mg Oral Q6H   alteplase (CATHFLO ACTIVASE) 10 mg in sodium chloride (PF) 0.9 % 30 mL  10 mg Intrapleural Once   And   dornase alfa (PULMOZYME) 5 mg in sterile water (preservative free) 30 mL  5 mg Intrapleural Once   cyanocobalamin  1,000 mcg Subcutaneous Daily   enoxaparin (LOVENOX) injection  40 mg Subcutaneous Q24H   gabapentin  300 mg Oral TID   ketorolac  30 mg Intravenous Q6H   lidocaine  1 patch Transdermal Q24H   methadone  40 mg Oral Daily   nicotine  21 mg Transdermal Daily   pantoprazole  40 mg Oral  Daily   polyethylene glycol  17 g Oral BID   senna-docusate  1 tablet Oral BID   sodium chloride flush  10 mL Intrapleural Q8H   sodium chloride flush  10 mL Intrapleural Q8H   sodium chloride flush  3 mL Intravenous Q12H   Continuous Infusions:  ampicillin-sulbactam (UNASYN) IV 3 g (10/27/23 1149)     LOS: 12 days    Time spent: 35 minutes    Ramiro Harvest, MD [facility]   To contact the attending provider between 7A-7P or the covering provider during after hours 7P-7A, please log into the web site [email or web address] and access using universal [place] password for that web site. If you do not have the password, please call the hospital operator.  10/27/2023, 4:51 PM

## 2023-10-27 NOTE — Progress Notes (Signed)
 NAME:  Pam Soto, MRN:  [id number], DOB:  01-31-1991, LOS: 12 ADMISSION DATE:  10/14/2023, CONSULTATION DATE:  3/11 REFERRING MD:  Rennis Chris, CHIEF COMPLAINT:  pleural effusion    History of Present Illness:  33 year old female patient who was initially admitted on 3/4 with chief complaint of right sided chest and back discomfort, with associated shortness of breath also Noting fever and chills.  Reportedly had fever the week prior..  Admitted to the hospital service on arrival febrile, room air sats 90s sodium 129 respiratory viral panel was negative chest x-ray concerning for multifocal pneumonia.  CT concerning for possible septic emboli culture sent started on broad-spectrum antibiotics.  Blood cultures positive for MSSA on 3/4, followed by infectious disease in addition to internal medicine service.  Initial echocardiogram negative for evidence of endocarditis.  TEE also completed this was negative for endocarditis as well.  Over the course of her mission antibiotics have been tailored by infectious disease, transitioned to cefazolin with plan to treat for 4 weeks followed by dose of oritavancin/dalbavancin on 4/2 to complete the 6-week course.  She has had fairly consistent and worsening right sided chest discomfort repeated noncontrasted CT on the ninth showed moderate right-sided effusion with possible element of loculation.  She was subsequently transferred to [facility] for pulmonary evaluation  Pertinent  Medical History  IV drug abuse, posttraumatic stress disorder Significant Hospital Events: Including procedures, antibiotic start and stop dates in addition to other pertinent events   3/4 admitted with right sided chest pain and fever, cultures positive for MSSA started on broad-spectrum antibiotics 3/5 seen by infectious disease for bacteremia 3/6 antibiotics changed to cefazolin per recommendation of infectious disease 3/7 TEE negative for endocarditis 3/9 increased pleuritic discomfort  CT chest small to moderate size right pleural effusion.  Scattered areas on the left side continue to raise concern for septic emboli 3/10 moved to [facility] for pulmonary evaluation 3/11 pulmonary consultation, Underwent thoracentesis by interventional radiology 3/14 chest tube placement 3/15 intrapleural lytics 3/16 intrapleural lytics  Interim History / Subjective:   300cc output from chest tube.   Objective   Blood pressure 94/61, pulse 72, temperature 98.8 F (37.1 C), resp. rate 16, height 5\' 7"  (1.702 m), weight 81.6 kg, last menstrual period 10/21/2023, SpO2 97%, unknown if currently breastfeeding.        Intake/Output Summary (Last 24 hours) at 10/27/2023 1410 Last data filed at 10/27/2023 1149 Gross per 24 hour  Intake 140 ml  Output 800 ml  Net -660 ml    Filed Weights   10/14/23 2300 10/17/23 1151  Weight: 81.6 kg 81.6 kg    Examination: Blood pressure 94/61, pulse 72, temperature 98.8 F (37.1 C), resp. rate 16, height 5\' 7"  (1.702 m), weight 81.6 kg, last menstrual period 10/21/2023, SpO2 97%, unknown if currently breastfeeding. Gen:      No acute distress HEENT:  EOMI, sclera anicteric Neck:     No masses; no thyromegaly Lungs:    Clear to auscultation bilaterally; normal respiratory effort CV:         Regular rate and rhythm; no murmurs Abd:      + bowel sounds; soft, non-tender; no palpable masses, no distension Ext:    No edema; adequate peripheral perfusion Skin:      Warm and dry; no rash Neuro: alert and oriented x 3 Psych: normal mood and affect   Chest x-ray with improving right effusion, consolidation Resolved Hospital Problem list     Assessment &  Plan:  Bilateral pneumonia with cavitary changes Right empyema MSSA bacteremia in the setting of IV drug use No evidence of endocarditis on echocardiogram Continue antibiotics per ID Monitor chest tube output.  Continue pleural lytic therapy for complete drainage Follow chest x-ray  Best  Practice (right click and "Reselect all SmartList Selections" daily)   Per primary team  Signature:   [name] MD [facility] See Amion for pager  If no response to pager , please call [phone] until 7pm After 7:00 pm call Elink  [phone] 10/27/2023, 2:10 PM

## 2023-10-28 ENCOUNTER — Inpatient Hospital Stay (HOSPITAL_COMMUNITY): Payer: MEDICAID

## 2023-10-28 DIAGNOSIS — A4101 Sepsis due to Methicillin susceptible Staphylococcus aureus: Secondary | ICD-10-CM | POA: Diagnosis not present

## 2023-10-28 DIAGNOSIS — F1910 Other psychoactive substance abuse, uncomplicated: Secondary | ICD-10-CM | POA: Diagnosis not present

## 2023-10-28 DIAGNOSIS — J869 Pyothorax without fistula: Secondary | ICD-10-CM | POA: Diagnosis not present

## 2023-10-28 DIAGNOSIS — J15211 Pneumonia due to Methicillin susceptible Staphylococcus aureus: Secondary | ICD-10-CM | POA: Diagnosis not present

## 2023-10-28 LAB — BASIC METABOLIC PANEL
Anion gap: 9 (ref 5–15)
BUN: 12 mg/dL (ref 6–20)
CO2: 24 mmol/L (ref 22–32)
Calcium: 8.1 mg/dL — ABNORMAL LOW (ref 8.9–10.3)
Chloride: 106 mmol/L (ref 98–111)
Creatinine, Ser: 0.62 mg/dL (ref 0.44–1.00)
GFR, Estimated: >60 mL/min (ref >60)
Glucose, Bld: 80 mg/dL (ref 70–99)
Potassium: 3.3 mmol/L — ABNORMAL LOW (ref 3.5–5.1)
Sodium: 139 mmol/L (ref 135–145)

## 2023-10-28 LAB — CBC
HCT: 26.3 % — ABNORMAL LOW (ref 36.0–46.0)
Hemoglobin: 8.4 g/dL — ABNORMAL LOW (ref 12.0–15.0)
MCH: 28.5 pg (ref 26.0–34.0)
MCHC: 31.9 g/dL (ref 30.0–36.0)
MCV: 89.2 fL (ref 80.0–100.0)
Platelets: 545 10*3/uL — ABNORMAL HIGH (ref 150–400)
RBC: 2.95 MIL/uL — ABNORMAL LOW (ref 3.87–5.11)
RDW: 12.2 % (ref 11.5–15.5)
WBC: 5.9 10*3/uL (ref 4.0–10.5)
nRBC: 0 % (ref 0.0–0.2)

## 2023-10-28 MED ORDER — POTASSIUM CHLORIDE CRYS ER 10 MEQ PO TBCR
40.0000 meq | EXTENDED_RELEASE_TABLET | Freq: Once | ORAL | Status: AC
  Administered 2023-10-28: 40 meq via ORAL
  Filled 2023-10-28: qty 4

## 2023-10-28 NOTE — Plan of Care (Signed)

## 2023-10-28 NOTE — Progress Notes (Signed)
 NAME:  Pam Soto, MRN:  [id number], DOB:  17-Oct-1990, LOS: 13 ADMISSION DATE:  10/14/2023, CONSULTATION DATE:  3/11 REFERRING MD:  Rennis Chris, CHIEF COMPLAINT:  pleural effusion    History of Present Illness:  33 year old female patient who was initially admitted on 3/4 with chief complaint of right sided chest and back discomfort, with associated shortness of breath also Noting fever and chills.  Reportedly had fever the week prior..  Admitted to the hospital service on arrival febrile, room air sats 90s sodium 129 respiratory viral panel was negative chest x-ray concerning for multifocal pneumonia.  CT concerning for possible septic emboli culture sent started on broad-spectrum antibiotics.  Blood cultures positive for MSSA on 3/4, followed by infectious disease in addition to internal medicine service.  Initial echocardiogram negative for evidence of endocarditis.  TEE also completed this was negative for endocarditis as well.  Over the course of her mission antibiotics have been tailored by infectious disease, transitioned to cefazolin with plan to treat for 4 weeks followed by dose of oritavancin/dalbavancin on 4/2 to complete the 6-week course.  She has had fairly consistent and worsening right sided chest discomfort repeated noncontrasted CT on the ninth showed moderate right-sided effusion with possible element of loculation.  She was subsequently transferred to [facility] for pulmonary evaluation  Pertinent  Medical History  IV drug abuse, posttraumatic stress disorder Significant Hospital Events: Including procedures, antibiotic start and stop dates in addition to other pertinent events   3/4 admitted with right sided chest pain and fever, cultures positive for MSSA started on broad-spectrum antibiotics 3/5 seen by infectious disease for bacteremia 3/6 antibiotics changed to cefazolin per recommendation of infectious disease 3/7 TEE negative for endocarditis 3/9 increased pleuritic discomfort  CT chest small to moderate size right pleural effusion.  Scattered areas on the left side continue to raise concern for septic emboli 3/10 moved to [facility] for pulmonary evaluation 3/11 pulmonary consultation, Underwent thoracentesis by interventional radiology 3/14 chest tube placement 3/15 intrapleural lytics 3/16 intrapleural lytics  Interim History / Subjective:   Third dose of intrapleural lytics given.  300 cc output from chest tube noted  Objective   Blood pressure 129/76, pulse 69, temperature 98.1 F (36.7 C), temperature source Oral, resp. rate 17, height 5\' 7"  (1.702 m), weight 81.6 kg, last menstrual period 10/21/2023, SpO2 98%, unknown if currently breastfeeding.        Intake/Output Summary (Last 24 hours) at 10/28/2023 1334 Last data filed at 10/27/2023 2340 Gross per 24 hour  Intake 340 ml  Output 300 ml  Net 40 ml    Filed Weights   10/14/23 2300 10/17/23 1151  Weight: 81.6 kg 81.6 kg    Examination: Gen:      No acute distress HEENT:  EOMI, sclera anicteric Neck:     No masses; no thyromegaly Lungs:    Clear to auscultation bilaterally; normal respiratory effort CV:         Regular rate and rhythm; no murmurs Abd:      + bowel sounds; soft, non-tender; no palpable masses, no distension Ext:    No edema; adequate peripheral perfusion Skin:      Warm and dry; no rash Neuro: alert and oriented x 3 Psych: normal mood and affect   Chest x-ray with lower lung opacities, small effusions  Resolved Hospital Problem list     Assessment & Plan:  Bilateral pneumonia with cavitary changes Right empyema MSSA bacteremia in the setting of IV drug use  No evidence of endocarditis on echocardiogram Continue antibiotics per ID Has completed 3 days of lytic therapy with marked improvement in effusion. Continue to monitor and remove chest tube when output is less than 100 cc Follow chest x-ray  Best Practice (right click and "Reselect all SmartList Selections"  daily)   Per primary team  Signature:   [name] MD [facility] See Amion for pager  If no response to pager , please call [phone] until 7pm After 7:00 pm call Elink  [phone] 10/28/2023, 1:34 PM

## 2023-10-28 NOTE — Progress Notes (Signed)
 PROGRESS NOTE    Pam Soto  WUJ:[id number] DOB: 05/21/1991 DOA: 10/14/2023 PCP: Mechele Claude, MD    No chief complaint on file.   Brief Narrative:  Pam Soto is 33 y.o. female with history of IV drug abuse who presents with right-sided chest pain and shortness of breath.  Patient endorses muscle cramps over the past few days which she believes are secondary to depleted electrolytes.  She reports her pleuritic chest pain has become severe and thus presented to the hospital.  She reports she has been trying to abstain from illicit substances and has been taking methadone.  She was concerned that her musculoskeletal pain was a result of withdrawal and she injected fentanyl yesterday.  On arrival to the ED patient was febrile, saturating the mid 90s on room air, tachypneic, tachycardic.  Labs reveal hyponatremia to 129, hypokalemia to 2.9, hypomagnesemia 1.6, leukocytosis 12.4 K.  Respiratory viral panel was negative.  Chest x-ray concerning for multifocal pneumonia.  CTA concerning for multifocal infiltrates which may be septic emboli.  Blood cultures were collected, the patient was started on IV antibiotics.  Repeat CT chest done concerning for pleural effusion, patient underwent thoracentesis, seen by PCCM noted to have an empyema with cultures positive for MSSA.  Chest tube placed and being managed by PCCM/pulmonary..    Assessment & Plan:   Principal Problem:   Pneumonia Active Problems:   Hyponatremia   Hypokalemia   Hypomagnesemia   Prolonged QT interval   IV drug abuse (HCC)   Multifocal pneumonia   Gram-positive cocci bacteremia   Empyema lung (HCC)   Sepsis due to methicillin susceptible Staphylococcus aureus (HCC)   MSSA bacteremia   Necrotic pneumonia (HCC)   #1 loculated right-sided pleural effusion/empyema/multifocal pneumonia -Patient noted to have some shortness of breath, ongoing pleuritic chest pain, repeat CT chest done concerning for pleural effusion and  worsening multilobar pneumonia. -Patient seen in consultation by PCCM underwent therapeutic and diagnostic thoracentesis 10/21/2023 per IR with 300 cc of amber-colored fluid extracted. -Cultures from thoracentesis consistent with MSSA.   -By lights criteria exudative process. -Patient with MSSA bacteremia in the setting of IV drug use. -Pulmonary following and patient underwent repeat chest x-ray, 10/23/2023 with stable right pleural effusion with associated atelectasis.  -Patient with ongoing right pleuritic chest pain.  -Continue empiric IV antibiotics of Unasyn. -CT chest ordered per PCCM/pulmonary which showed moderate loculated right hydropneumothorax.  Pneumothorax component is small may be related to recent thoracentesis.  Complete opacification of right lower lobe, suspicious for pneumonia.  Bubbly air peripherally, unclear if this is within pleural fluid or lung parenchyma, raising concern for necrotic pneumonia-intraparenchymal.  Partial opacification of right middle lobe.  Small left pleural effusion is new.  Bandlike atelectasis in the left lower lobe. -Chest tube placed by pulmonary/PCCM on 10/24/2023 and patient status post 3 days of fibrinolytic treatment with improvement in the effusion. -Per pulmonary continue chest tube until output is less than 100 cc. -Repeat chest x-ray in the morning. -Per pulmonary.  2.  MSSA bacteremia/multifocal pneumonia/necrotic pneumonia -Patient on admission noted to have multifocal infiltrates noted in right lower lobe on initial CT angiogram chest with concerns for septic emboli. -Repeat CT revealed worsening pleural effusion. -3/4 blood cultures positive for MSSA bacteremia. -Repeat cultures from 10/16/2023 negative to date. -2D echo and TEE negative for vegetation/endocarditis. -ID consulted and following and given patient's IV drug use and MSSA bacteremia with concern for septic emboli, ID recommending empiric treatment for native TV  endocarditis. -Patient noted will need cefazolin for 4 weeks from 3/5 with EOT on/11/11/2023 followed by dose of oritavancin/dalbavancin on 11/12/2023 to complete a 6-week course of antibiotic treatment. -Due to current effusion/?  Empyema patient was placed on IV Unasyn. -CT chest ordered per PCCM/pulmonary which showed moderate loculated right hydropneumothorax.  Pneumothorax component is small may be related to recent thoracentesis.  Complete opacification of right lower lobe, suspicious for pneumonia.  Bubbly air peripherally, unclear if this is within pleural fluid or lung parenchyma, raising concern for necrotic pneumonia-intraparenchymal.  Partial opacification of right middle lobe.  Small left pleural effusion is new.  Bandlike atelectasis in the left lower lobe.. -Due to patient's active IV drug use she is not a candidate for PICC therapy and will need to remain in the hospital in supervised setting for duration of treatment.  Patient noted to be amenable to this. -Continue incentive spirometry, flutter valve. -Patient reassessed by ID on 10/24/2023, MRI of the T and L-spine was obtained with no discitis or osteomyelitis noted. -ID recommended continuation of plan for at least 4 weeks of IV Unasyn from 3/5 with EOT/4/1 followed by 1 dose of oritavancin/dalbavancin on 4/2 to complete a 6-week course of antibiotic treatment. -ID was following but have signed off again as of 10/24/2023.   3.  Hep B nonimmune -Consider vaccination once acute illness has resolved.  4.  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 -Will need outpatient treatment. -Will defer to ID.  5.  Sepsis secondary to MSSA bacteremia, MSSA empyema and pneumonia -Patient met criteria for sepsis on arrival with leukocytosis, tachycardia, tachypnea, likely secondary to pneumonia, bacteremia, empyema. -Continue current empiric IV antibiotics as noted above. -See problem #1 and 2.  6.  Normocytic anemia/anemia of chronic disease/low  vitamin B12 levels -Patient with no overt bleeding. -Hemoglobin stable at 8.4. -Some sanguinous drainage noted in chest tube. -Anemia panel with iron of 13, TIBC of 234, ferritin of 96, folate 7.5, vitamin B12 266. -Due to low vitamin B12 levels patient started on vitamin B12 1000 mcg subcutaneously daily while in the hospital and will transition to oral vitamin B12 on discharge.  -Follow H&H. -Transfusion threshold hemoglobin < 7.  7.  Polysubstance abuse/IV drug abuse -Patient noted to have almost daily use of heroin/fentanyl, cocaine. -Occasional use of benzodiazepines though not consistently.  Patient denies alcohol use. -Patient noted to be working towards abstinence and on methadone in outpatient setting. -Patient noted to use IV fentanyl the day prior to arrival. -Patient desires continued abstinence and would like medication assisted withdrawal while admitted admitted. -Polysubstance cessation stressed to patient daily. -Continue home regimen methadone. -TOC consulted for resource assistance.  8.  Right-sided pleuritis -Likely secondary to problem #1 and now with new chest tube placement. -Pain better controlled per patient on scheduled Toradol. -Continue current regimen of methadone, gabapentin, Tylenol, lidocaine patches, as needed oxycodone. -Continue scheduled Toradol. -Continue treatment as in problem #1.  9.  Hypokalemia/hypomagnesemia -Potassium at 3.3 this morning, magnesium at 2.0.   -K-Dur 40 mEq p.o. x 1.   -Repeat labs in the AM.  10.  Prolonged QT -Optimize electrolytes, avoid QT prolongation medications. -Repeat EKG 3/5 showed resolution of QT prolongation.  11.  Hyponatremia -Resolved.    12.  AKI, POA -Resolved.      DVT prophylaxis: Lovenox Code Status: Full Family Communication: Updated patient and boyfriend at bedside.   Disposition: Home once IV antibiotics are completed and cleared by pulmonary and ID.  Status is: Inpatient Remains  inpatient appropriate because: Severity of illness, need for IV antibiotics will need until/08/2023, not a candidate for PICC therapy.  Will need to remain in-house until at least IV antibiotics are completed.   Consultants:  PCCM/pulmonary: Dr. Isaiah Serge 10/21/2023 ID: Dr. Elinor Parkinson 10/15/2023, Dr. Thedore Mins 10/24/2023  Procedures:  CT angiogram chest 10/14/2023 CT chest with contrast 10/19/2023 Chest x-ray 10/14/2023, 10/21/2023, 10/23/2023 2D echo 10/15/2023 TEE 10/17/2023 Ultrasound-guided diagnostic and therapeutic right-sided thoracentesis yielding 300 cc of amber-colored fluid: Per IR: Anders Grant, NP 10/21/2023 CT chest 10/23/2023 Chest tube placement by Dr. Merrily Pew 10/24/2023 MRI T and L-spine 10/24/2023  Antimicrobials:  Anti-infectives (From admission, onward)    Start     Dose/Rate Route Frequency Ordered Stop   10/19/23 1700  Ampicillin-Sulbactam (UNASYN) 3 g in sodium chloride 0.9 % 100 mL IVPB        3 g 200 mL/hr over 30 Minutes Intravenous Every 6 hours 10/19/23 1609     10/16/23 0915  ceFAZolin (ANCEF) IVPB 2g/100 mL premix  Status:  Discontinued        2 g 200 mL/hr over 30 Minutes Intravenous Every 8 hours 10/16/23 0826 10/19/23 1610   10/15/23 1000  vancomycin (VANCOCIN) IVPB 1000 mg/200 mL premix  Status:  Discontinued        1,000 mg 200 mL/hr over 60 Minutes Intravenous Every 12 hours 10/15/23 0406 10/16/23 0826   10/15/23 0600  cefTRIAXone (ROCEPHIN) 2 g in sodium chloride 0.9 % 100 mL IVPB  Status:  Discontinued        2 g 200 mL/hr over 30 Minutes Intravenous Every 24 hours 10/15/23 0354 10/16/23 0826   10/15/23 0400  doxycycline (VIBRAMYCIN) 100 mg in sodium chloride 0.9 % 250 mL IVPB  Status:  Discontinued        100 mg 125 mL/hr over 120 Minutes Intravenous Every 12 hours 10/15/23 0354 10/16/23 0826   10/14/23 2315  ceFEPIme (MAXIPIME) 2 g in sodium chloride 0.9 % 100 mL IVPB        2 g 200 mL/hr over 30 Minutes Intravenous  Once 10/14/23 2303 10/15/23 0109   10/14/23  2315  Vancomycin (VANCOCIN) 1,500 mg in sodium chloride 0.9 % 500 mL IVPB        1,500 mg 250 mL/hr over 120 Minutes Intravenous  Once 10/14/23 2304 10/15/23 0228         Subjective: Sitting up in chair.  Shortness of breath improving.  Chest pain improved with scheduled Toradol per patient.  Having bowel movements.  Tolerating current diet.  Boyfriend at bedside.   Objective: Vitals:   10/27/23 0321 10/27/23 1528 10/27/23 1900 10/28/23 0900  BP: 94/61 112/70 113/71 129/76  Pulse: 72 93 81 69  Resp: 16  17   Temp: 98.8 F (37.1 C) 97.8 F (36.6 C) 98.1 F (36.7 C)   TempSrc:  Oral Oral   SpO2: 97% 97% 97% 98%  Weight:      Height:        Intake/Output Summary (Last 24 hours) at 10/28/2023 1434 Last data filed at 10/27/2023 2340 Gross per 24 hour  Intake 340 ml  Output 300 ml  Net 40 ml   Filed Weights   10/14/23 2300 10/17/23 1151  Weight: 81.6 kg 81.6 kg    Examination:  General exam: NAD. Respiratory system: Decreased breath sound in the right base.  Coarse breath sounds on the right side.  No wheezing.  Fair air movement.  Speaking in full sentences.  No use of accessory  muscles of respiration.  Chest tube in place on the right.   Cardiovascular system: RRR no murmurs rubs or gallops.  No JVD.  No lower extremity edema.   Gastrointestinal system: Abdomen is soft, nontender, nondistended, positive bowel sounds.  No rebound.  No guarding. Central nervous system: Alert and oriented.  Moving extremities spontaneously.  No focal neurological deficits. Extremities: Symmetric 5 x 5 power. Skin: No rashes, lesions or ulcers Psychiatry: Judgement and insight appear normal. Mood & affect appropriate.     Data Reviewed: I have personally reviewed following labs and imaging studies  CBC: Recent Labs  Lab 10/22/23 0536 10/23/23 1137 10/24/23 0415 10/25/23 0907 10/26/23 0852 10/27/23 0849 10/28/23 0634  WBC 9.3 11.5* 9.6 9.2 6.7 5.7 5.9  NEUTROABS 6.1 7.8* 6.9 5.9   --  2.7  --   HGB 9.7* 10.9* 10.0* 9.3* 9.1* 9.9* 8.4*  HCT 29.9* 33.3* 30.3* 28.6* 27.9* 30.9* 26.3*  MCV 89.3 89.5 87.1 89.7 88.3 89.3 89.2  PLT 439* 479* 485* 480* 473* 564* 545*    Basic Metabolic Panel: Recent Labs  Lab 10/23/23 1137 10/24/23 0415 10/25/23 0907 10/26/23 0852 10/27/23 0849 10/28/23 0634  NA 138 135 136 139 139 139  K 4.3 3.8 3.8 3.8 3.8 3.3*  CL 106 104 103 103 105 106  CO2 19* 22 20* 24 23 24   GLUCOSE 78 93 94 89 80 80  BUN 10 10 11 13 8 12   CREATININE 0.62 0.63 0.56 0.49 0.58 0.62  CALCIUM 8.7* 8.5* 8.3* 8.6* 8.7* 8.1*  MG 2.1 1.9 1.8 2.1 2.0  --   PHOS 4.7* 4.2  --   --  4.6  --     GFR: Estimated Creatinine Clearance: 113 mL/min (by C-G formula based on SCr of 0.62 mg/dL).  Liver Function Tests: Recent Labs  Lab 10/22/23 0536 10/23/23 1137 10/24/23 0415 10/27/23 0849  AST 14* 17  --   --   ALT 10 13  --   --   ALKPHOS 66 77  --   --   BILITOT 0.3 0.3  --   --   PROT 6.2* 7.5  --   --   ALBUMIN 1.9* 2.2* 2.0* 2.1*    CBG: No results for input(s): "GLUCAP" in the last 168 hours.   Recent Results (from the past 240 hours)  MRSA Next Gen by PCR, Nasal     Status: None   Collection Time: 10/19/23  4:03 PM   Specimen: Nasal Mucosa; Nasal Swab  Result Value Ref Range Status   MRSA by PCR Next Gen NOT DETECTED NOT DETECTED Final    Comment: (NOTE) The GeneXpert MRSA Assay (FDA approved for NASAL specimens only), is one component of a comprehensive MRSA colonization surveillance program. It is not intended to diagnose MRSA infection nor to guide or monitor treatment for MRSA infections. Test performance is not FDA approved in patients less than 68 years old. Performed at [facility], [address] [place], [place] [address]   Fungus Culture With Stain     Status: None (Preliminary result)   Collection Time: 10/21/23 11:22 AM   Specimen: Lung, Right; Pleural Fluid  Result Value Ref Range Status   Fungus Stain Final report  Final     Comment: (NOTE) Performed At: [facility] [address] [place], [place] [id number] Jolene Schimke MD YQ:[id number]    Fungus (Mycology) Culture PENDING  Incomplete   Fungal Source PLEURAL  Final    Comment: Performed at [facility], [address], [place], [place] [address]  Acid Fast Smear (AFB)     Status: None   Collection Time: 10/21/23 11:22 AM   Specimen: Lung, Right; Pleural Fluid  Result Value Ref Range Status   AFB Specimen Processing Direct Inoculation  Final   Acid Fast Smear Negative  Final    Comment: (NOTE) Performed At: [facility] [address] [place], [place] [id number] Jolene Schimke MD UX:[id number]    Source (AFB) PLEURAL  Final    Comment: Performed at [facility], [address] [place], [place] [address]  Body fluid culture w Gram Stain     Status: None   Collection Time: 10/21/23 11:22 AM   Specimen: Lung, Right; Pleural Fluid  Result Value Ref Range Status   Specimen Description PLEURAL  Final   Special Requests NONE  Final   Gram Stain NO WBC SEEN NO ORGANISMS SEEN   Final   Culture   Final    RARE STAPHYLOCOCCUS AUREUS CRITICAL RESULT CALLED TO, READ BACK BY AND VERIFIED WITH: RN L.YANG AT 1305 ON 10/22/2023 BY T.SAAD. Performed at [facility], [address], [place], [place] [address]    Report Status 10/25/2023 FINAL  Final   Organism ID, Bacteria STAPHYLOCOCCUS AUREUS  Final      Susceptibility   Staphylococcus aureus - MIC*    CIPROFLOXACIN >=8 RESISTANT Resistant     ERYTHROMYCIN >=8 RESISTANT Resistant     GENTAMICIN <=0.5 SENSITIVE Sensitive     OXACILLIN 0.5 SENSITIVE Sensitive     TETRACYCLINE <=1 SENSITIVE Sensitive     VANCOMYCIN 1 SENSITIVE Sensitive     TRIMETH/SULFA >=320 RESISTANT Resistant     CLINDAMYCIN <=0.25 SENSITIVE Sensitive     RIFAMPIN <=0.5 SENSITIVE Sensitive     Inducible Clindamycin NEGATIVE Sensitive     LINEZOLID 2 SENSITIVE Sensitive     * RARE STAPHYLOCOCCUS  AUREUS  Fungus Culture Result     Status: None   Collection Time: 10/21/23 11:22 AM  Result Value Ref Range Status   Result 1 Comment  Final    Comment: (NOTE) KOH/Calcofluor preparation:  no fungus observed. Performed At: [facility] [address] [place], [place] [id number] Jolene Schimke MD FI:[id number]   Culture, blood (Routine X 2) w Reflex to ID Panel     Status: None (Preliminary result)   Collection Time: 10/25/23  9:05 AM   Specimen: BLOOD LEFT HAND  Result Value Ref Range Status   Specimen Description BLOOD LEFT HAND  Final   Special Requests   Final    BOTTLES DRAWN AEROBIC AND ANAEROBIC Blood Culture results may not be optimal due to an inadequate volume of blood received in culture bottles   Culture   Final    NO GROWTH 3 DAYS Performed at [facility], [address] [place], [place] [address]    Report Status PENDING  Incomplete  Culture, blood (Routine X 2) w Reflex to ID Panel     Status: None (Preliminary result)   Collection Time: 10/25/23  9:06 AM   Specimen: BLOOD LEFT ARM  Result Value Ref Range Status   Specimen Description BLOOD LEFT ARM  Final   Special Requests   Final    BOTTLES DRAWN AEROBIC AND ANAEROBIC Blood Culture results may not be optimal due to an inadequate volume of blood received in culture bottles   Culture   Final    NO GROWTH 3 DAYS Performed at [facility], [address], [place], [place] [address]    Report Status PENDING  Incomplete         Radiology Studies: DG CHEST PORT 1 VIEW Result Date: 10/28/2023 CLINICAL DATA:  Loculated pleural effusion. EXAM: PORTABLE CHEST 1 VIEW COMPARISON:  Chest radiograph dated 10/27/2023. FINDINGS: Chest tube in similar position. There is shallow inspiration. Bilateral mid to lower lung field densities as well as small bilateral pleural effusions similar to prior radiograph. No pneumothorax. Stable cardiac silhouette no acute osseous pathology. IMPRESSION: No significant  interval change. Electronically Signed   By: Elgie Collard M.D.   On: 10/28/2023 13:31   DG CHEST PORT 1 VIEW Result Date: 10/27/2023 CLINICAL DATA:  Pleural effusion.  Right chest tube. EXAM: PORTABLE CHEST 1 VIEW COMPARISON:  Radiographs 10/26/2023 and 10/25/2023. Chest CT 10/23/2023. FINDINGS: 0533 hours. Small caliber right pleural pigtail drainage catheter is unchanged in position. Probable minimal residual pneumothorax laterally at the right lung base. Small right pleural effusion has nearly completely resolved. Patchy bibasilar pulmonary opacities are unchanged. The heart size and mediastinal contours are stable. No acute osseous findings are seen. IMPRESSION: Near complete resolution of right pleural effusion with probable minimal residual pneumothorax laterally at the right lung base. Stable patchy bibasilar pulmonary opacities. Electronically Signed   By: Carey Bullocks M.D.   On: 10/27/2023 09:32        Scheduled Meds:  acetaminophen  1,000 mg Oral Q6H   cyanocobalamin  1,000 mcg Subcutaneous Daily   enoxaparin (LOVENOX) injection  40 mg Subcutaneous Q24H   gabapentin  300 mg Oral TID   ketorolac  30 mg Intravenous Q6H   lidocaine  1 patch Transdermal Q24H   methadone  40 mg Oral Daily   nicotine  21 mg Transdermal Daily   pantoprazole  40 mg Oral Daily   polyethylene glycol  17 g Oral BID   senna-docusate  1 tablet Oral BID   sodium chloride flush  10 mL Intrapleural Q8H   sodium chloride flush  10 mL Intrapleural Q8H   sodium chloride flush  3 mL Intravenous Q12H   Continuous Infusions:  ampicillin-sulbactam (UNASYN) IV 3 g (10/28/23 1237)     LOS: 13 days    Time spent: 35 minutes    Ramiro Harvest, MD [facility]   To contact the attending provider between 7A-7P or the covering provider during after hours 7P-7A, please log into the web site [email or web address] and access using universal [place] password for that web site. If you do not have the  password, please call the hospital operator.  10/28/2023, 2:34 PM

## 2023-10-29 ENCOUNTER — Inpatient Hospital Stay (HOSPITAL_COMMUNITY): Payer: MEDICAID

## 2023-10-29 DIAGNOSIS — J15211 Pneumonia due to Methicillin susceptible Staphylococcus aureus: Secondary | ICD-10-CM | POA: Diagnosis not present

## 2023-10-29 DIAGNOSIS — J869 Pyothorax without fistula: Secondary | ICD-10-CM | POA: Diagnosis not present

## 2023-10-29 DIAGNOSIS — I76 Septic arterial embolism: Secondary | ICD-10-CM | POA: Diagnosis not present

## 2023-10-29 LAB — BASIC METABOLIC PANEL
Anion gap: 5 (ref 5–15)
BUN: 8 mg/dL (ref 6–20)
CO2: 27 mmol/L (ref 22–32)
Calcium: 8.3 mg/dL — ABNORMAL LOW (ref 8.9–10.3)
Chloride: 106 mmol/L (ref 98–111)
Creatinine, Ser: 0.57 mg/dL (ref 0.44–1.00)
GFR, Estimated: >60 mL/min (ref >60)
Glucose, Bld: 79 mg/dL (ref 70–99)
Potassium: 3.8 mmol/L (ref 3.5–5.1)
Sodium: 138 mmol/L (ref 135–145)

## 2023-10-29 LAB — CBC
HCT: 27.5 % — ABNORMAL LOW (ref 36.0–46.0)
Hemoglobin: 8.9 g/dL — ABNORMAL LOW (ref 12.0–15.0)
MCH: 28.6 pg (ref 26.0–34.0)
MCHC: 32.4 g/dL (ref 30.0–36.0)
MCV: 88.4 fL (ref 80.0–100.0)
Platelets: 529 10*3/uL — ABNORMAL HIGH (ref 150–400)
RBC: 3.11 MIL/uL — ABNORMAL LOW (ref 3.87–5.11)
RDW: 12.3 % (ref 11.5–15.5)
WBC: 6.1 10*3/uL (ref 4.0–10.5)
nRBC: 0 % (ref 0.0–0.2)

## 2023-10-29 LAB — MAGNESIUM: Magnesium: 1.9 mg/dL (ref 1.7–2.4)

## 2023-10-29 MED ORDER — LIDOCAINE 5 % EX PTCH
1.0000 | MEDICATED_PATCH | CUTANEOUS | Status: DC
  Administered 2023-10-30 – 2023-11-04 (×6): 1 via TRANSDERMAL
  Filled 2023-10-29 (×11): qty 1

## 2023-10-29 NOTE — Progress Notes (Signed)
 [facility] Progress Note Patient: Pam Soto WUJ:[id number] DOB: 04/27/1991 DOA: 10/14/2023  DOS: the patient was seen and examined on 10/29/2023  Brief Hospital Course: PMH of IV drug abuse presented to the hospital, as a right-sided chest pain.  Found to have MSSA bacteremia, pneumonia as well as empyema. ID and [facility] were consulted. Underwent chest tube placement which was removed on 3/19.  Assessment and Plan: Empyema with multifocal pneumonia. In the setting of MSSA. Presented with chest pain. No evidence of PE. Pulmonary was consulted.  Underwent chest tube placement.  Multiple fibrinolytic therapy performed. Chest tube removed on 3/19. Patient reports some drainage.  Will monitor.  MSSA bacteremia.  Sepsis. Multifocal pneumonia. ID following.  Met SIRS criteria on admission with leukocytosis, tachycardia and tachypnea. Echocardiogram and TEE negative for vegetation. ID recommending IV cefazolin for 4 weeks with EOT/1 followed by oritavancin for complete 6 weeks therapy. Monitor. MRI T-spine and L-spine negative for any discitis.  History of hep C infection. Will need outpatient therapy.  Hep B nonimmune status. Will require vaccine.  Polysubstance abuse, IV drug abuse. Chronic pain syndrome. Recently started on methadone. For now we will monitor. No UDS performed on admission. Unsure if methadone status is also verified.  Will confirm tomorrow.  Hypokalemia and hypomagnesemia. Will treat.  Prolonged QTc. Improving.  AKI. Baseline creatinine 0.5. Outpatient creatinine 1.21. Improving with IV hydration.  Anemia of chronic disease. Monitor H&H.     Subjective: No nausea no vomiting no fever no chills.  Physical Exam: General: in Mild distress, No Rash Cardiovascular: S1 and S2 Present, No Murmur Respiratory: Good respiratory effort, Bilateral Air entry present.  Basal crackles, No wheezes Abdomen: Bowel Sound present, No tenderness Extremities: No  edema Neuro: Alert and oriented x3, no new focal deficit  Data Reviewed: I have Reviewed nursing notes, Vitals, and Lab results. Since last encounter, pertinent lab results CBC and BMP   . I have ordered test including CBC and BMP  .  Discussed with pulmonary. Order chest x-ray.  Disposition: Status is: Inpatient Remains inpatient appropriate because: Need IV antibiotics.  enoxaparin (LOVENOX) injection 40 mg Start: 10/15/23 1000   Family Communication: Fianc at bedside.  Patient provided permission. Level of care: Telemetry Medical   Vitals:   10/28/23 1742 10/28/23 1958 10/29/23 0545 10/29/23 1513  BP:  105/69 115/84 117/75  Pulse:  68 77 75  Resp:   17 16  Temp: 98.3 F (36.8 C) 98.3 F (36.8 C) 98.3 F (36.8 C) 98.9 F (37.2 C)  TempSrc: Oral Oral Oral Oral  SpO2:  99% 98% 94%  Weight:      Height:         Author: Lynden Oxford, MD 10/29/2023 6:33 PM  Please look on [email or web address] to find out who is on call.

## 2023-10-29 NOTE — Progress Notes (Signed)
 NAME:  Pam Soto, MRN:  [id number], DOB:  [birth year], LOS: 14 ADMISSION DATE:  10/14/2023, CONSULTATION DATE:  3/11 REFERRING MD:  Rennis Chris, CHIEF COMPLAINT:  pleural effusion    History of Present Illness:  33 year old female patient who was initially admitted on 3/4 with chief complaint of right sided chest and back discomfort, with associated shortness of breath also Noting fever and chills.  Reportedly had fever the week prior..  Admitted to the hospital service on arrival febrile, room air sats 90s sodium 129 respiratory viral panel was negative chest x-ray concerning for multifocal pneumonia.  CT concerning for possible septic emboli culture sent started on broad-spectrum antibiotics.  Blood cultures positive for MSSA on 3/4, followed by infectious disease in addition to internal medicine service.  Initial echocardiogram negative for evidence of endocarditis.  TEE also completed this was negative for endocarditis as well.  Over the course of her mission antibiotics have been tailored by infectious disease, transitioned to cefazolin with plan to treat for 4 weeks followed by dose of oritavancin/dalbavancin on 4/2 to complete the 6-week course.  She has had fairly consistent and worsening right sided chest discomfort repeated noncontrasted CT on the ninth showed moderate right-sided effusion with possible element of loculation.  She was subsequently transferred to [facility] for pulmonary evaluation  Pertinent  Medical History  IV drug abuse, posttraumatic stress disorder Significant Hospital Events: Including procedures, antibiotic start and stop dates in addition to other pertinent events   3/4 admitted with right sided chest pain and fever, cultures positive for MSSA started on broad-spectrum antibiotics 3/5 seen by infectious disease for bacteremia 3/6 antibiotics changed to cefazolin per recommendation of infectious disease 3/7 TEE negative for endocarditis 3/9 increased pleuritic discomfort  CT chest small to moderate size right pleural effusion.  Scattered areas on the left side continue to raise concern for septic emboli 3/10 moved to [facility] for pulmonary evaluation 3/11 pulmonary consultation, Underwent thoracentesis by interventional radiology 3/14 chest tube placement 3/15 intrapleural lytics 3/16 intrapleural lytics 3/17 Third dose of intrapleural lytics given.  Interim History / Subjective:   No output noted from chest tube overnight Chest x-ray reviewed  Objective   Blood pressure 115/84, pulse 77, temperature 98.3 F (36.8 C), temperature source Oral, resp. rate 17, height 5\' 7"  (1.702 m), weight 81.6 kg, last menstrual period 10/21/2023, SpO2 98%, unknown if currently breastfeeding.        Intake/Output Summary (Last 24 hours) at 10/29/2023 1425 Last data filed at 10/29/2023 1000 Gross per 24 hour  Intake 961.44 ml  Output 500 ml  Net 461.44 ml    Filed Weights   10/14/23 2300 10/17/23 1151  Weight: 81.6 kg 81.6 kg    Examination: Gen:      No acute distress HEENT:  EOMI, sclera anicteric Neck:     No masses; no thyromegaly Lungs:    Clear to auscultation bilaterally; normal respiratory effort CV:         Regular rate and rhythm; no murmurs Abd:      + bowel sounds; soft, non-tender; no palpable masses, no distension Ext:    No edema; adequate peripheral perfusion Skin:      Warm and dry; no rash Neuro: alert and oriented x 3 Psych: normal mood and affect   Chest x-ray with low lung opacities, minimal effusion  Resolved Hospital Problem list     Assessment & Plan:  Bilateral pneumonia with cavitary changes Right empyema MSSA bacteremia in the setting of  IV drug use No evidence of endocarditis on echocardiogram Continue antibiotics per ID Has completed 3 days of lytic therapy with marked improvement in effusion. No chest tube output since yesterday Will discontinue chest tube Follow chest x-ray  PCCM will be available as needed.   Please call with any questions.  Best Practice (right click and "Reselect all SmartList Selections" daily)   Per primary team  Signature:   [name] MD [facility] See Amion for pager  If no response to pager , please call [phone] until 7pm After 7:00 pm call Elink  [phone] 10/29/2023, 2:25 PM

## 2023-10-30 ENCOUNTER — Inpatient Hospital Stay (HOSPITAL_COMMUNITY): Payer: MEDICAID

## 2023-10-30 DIAGNOSIS — J15211 Pneumonia due to Methicillin susceptible Staphylococcus aureus: Secondary | ICD-10-CM | POA: Diagnosis not present

## 2023-10-30 LAB — CULTURE, BLOOD (ROUTINE X 2)
Culture: NO GROWTH
Culture: NO GROWTH

## 2023-10-30 MED ORDER — OXYCODONE HCL 5 MG PO TABS
5.0000 mg | ORAL_TABLET | ORAL | Status: DC | PRN
  Administered 2023-10-30 – 2023-11-08 (×14): 5 mg via ORAL
  Filled 2023-10-30 (×11): qty 1

## 2023-10-30 MED ORDER — VITAMIN B-12 1000 MCG PO TABS
1000.0000 ug | ORAL_TABLET | Freq: Every day | ORAL | Status: DC
  Administered 2023-10-31 – 2023-11-11 (×12): 1000 ug via ORAL
  Filled 2023-10-30 (×13): qty 1

## 2023-10-30 MED ORDER — ONDANSETRON HCL 4 MG/2ML IJ SOLN
4.0000 mg | Freq: Four times a day (QID) | INTRAMUSCULAR | Status: DC | PRN
  Administered 2023-11-04 – 2023-11-06 (×5): 4 mg via INTRAVENOUS
  Filled 2023-10-30 (×5): qty 2

## 2023-10-30 MED ORDER — OXYCODONE HCL 5 MG PO TABS
10.0000 mg | ORAL_TABLET | ORAL | Status: DC | PRN
  Administered 2023-10-31 – 2023-11-11 (×46): 10 mg via ORAL
  Filled 2023-10-30 (×49): qty 2

## 2023-10-30 NOTE — Progress Notes (Signed)
 [facility] Progress Note Patient: Pam Soto ZOX:[id number] DOB: January 22, 1991 DOA: 10/14/2023  DOS: the patient was seen and examined on 10/30/2023  Brief Hospital Course: PMH of IV drug abuse presented to the hospital, as a right-sided chest pain.  Found to have MSSA bacteremia, pneumonia as well as empyema. ID and [facility] were consulted. Underwent chest tube placement which was removed on 3/19. Assessment and Plan: Empyema with multifocal pneumonia. In the setting of MSSA. Presented with chest pain. No evidence of PE. Pulmonary was consulted.  Underwent chest tube placement.  Multiple fibrinolytic therapy performed. Chest tube removed on 3/19. Repeat chest x-ray unremarkable.   MSSA bacteremia.  Sepsis. Multifocal pneumonia. ID following.  Met SIRS criteria on admission with leukocytosis, tachycardia and tachypnea. Echocardiogram and TEE negative for vegetation. ID recommending IV cefazolin for 4 weeks with EOT/1 followed by oritavancin for complete 6 weeks therapy. Monitor. MRI T-spine and L-spine negative for any discitis.   History of hep C infection. Will need outpatient therapy.   Hep B nonimmune status. Will require vaccine.   Polysubstance abuse, IV drug abuse. Chronic pain syndrome. Recently started on methadone. For now we will monitor. No UDS performed on admission. Unsure if methadone status is also verified.  Will confirm tomorrow.   Hypokalemia and hypomagnesemia. Replaced.   Prolonged QTc. Improving.   AKI. Baseline creatinine 0.5. Outpatient creatinine 1.21. Improving with IV hydration.   Anemia of chronic disease. Monitor H&H.   Subjective: Pain unchanged.  No nausea no vomiting no fever no chills.  No bleeding.  No drainage.  Physical Exam: General: in Mild distress, No Rash Cardiovascular: S1 and S2 Present, No Murmur Respiratory: Good respiratory effort, Bilateral Air entry present. No Crackles, No wheezes Abdomen: Bowel Sound present, No  tenderness Extremities: No edema Neuro: Alert and oriented x3, no new focal deficit  Data Reviewed: I have Reviewed nursing notes, Vitals, and Lab results. I have ordered test including CBC and BMP  .   Disposition: Status is: Inpatient Remains inpatient appropriate because: Needs IV antibiotics.  enoxaparin (LOVENOX) injection 40 mg Start: 10/15/23 1000   Family Communication: Family at bedside Level of care: Telemetry Medical   Vitals:   10/29/23 1936 10/30/23 0409 10/30/23 0948 10/30/23 1256  BP: 122/77 124/82 124/82 124/82  Pulse: 85 70 72 72  Resp: 18 18 16 16   Temp:  98.2 F (36.8 C) 97.9 F (36.6 C) 97.9 F (36.6 C)  TempSrc: Oral Oral Oral Oral  SpO2: 98% 96%  97%  Weight:      Height:         Author: Lynden Oxford, MD 10/30/2023 6:09 PM  Please look on [email or web address] to find out who is on call.

## 2023-10-30 NOTE — Plan of Care (Signed)

## 2023-10-30 NOTE — Hospital Course (Addendum)
 PMH of IV drug abuse presented to the hospital, as a right-sided chest pain.  Found to have MSSA bacteremia, pneumonia as well as empyema. ID and [facility] were consulted. Underwent chest tube placement which was removed on 3/19. Assessment and Plan: Empyema with multifocal pneumonia. In the setting of MSSA bacteremia Presented with chest pain. No evidence of PE.  Found to have empyema. Pulmonary was consulted.  Underwent chest tube placement.  Multiple fibrinolytic therapy performed. Chest tube removed on 3/19. Repeat chest x-ray unremarkable.   MSSA bacteremia.  Sepsis. Multifocal pneumonia. ID following.  Met SIRS criteria on admission with leukocytosis, tachycardia and tachypnea. Echocardiogram and TEE negative for vegetation. ID recommending IV cefazolin for 4 weeks with EOT/1 followed by oritavancin for complete 6 weeks therapy. Currently on IV Unasyn.  Order for oritavancin placed for 4/1. MRI T-spine and L-spine negative for any discitis.   History of hep C infection. Will need outpatient therapy.   Hep B nonimmune status. Will require vaccine.   Polysubstance abuse, IV drug abuse. Chronic pain syndrome. Recently started on methadone outpatient, confirmed on admission. No UDS performed on admission.   Hypokalemia and hypomagnesemia. Replaced.   Prolonged QTc. Resolved.  QTc 450 on 3/20.   AKI. Baseline creatinine 0.5. Outpatient creatinine 1.21. Improved with IV hydration.   Anemia of chronic disease. Monitor H&H.  Currently stable.

## 2023-10-31 DIAGNOSIS — I76 Septic arterial embolism: Secondary | ICD-10-CM | POA: Diagnosis not present

## 2023-10-31 NOTE — Progress Notes (Signed)
 [facility] PROGRESS NOTE  Patient: Pam Soto [id number]:[id number]   PCP: Mechele Claude, MD DOB: Jan 21, 1991   DOA: 10/14/2023   DOS: 10/31/2023    Subjective: Refused labs in the morning.  No nausea no vomiting.  Pain well-controlled.  Continues to work with incentive spirometry.  Objective:  Vitals:   10/30/23 1256 10/30/23 1949 10/31/23 0754 10/31/23 1309  BP: 124/82 113/78 108/68 125/88  Pulse: 72 73 63 86  Resp: 16 20 18 16   Temp: 97.9 F (36.6 C) 98.6 F (37 C) 97.8 F (36.6 C) 98.2 F (36.8 C)  TempSrc: Oral   Oral  SpO2: 97% 96% 96%   Weight:      Height:       Faint basal crackles. S1-S2 present. No edema.  Assessment and plan: Continue with IV antibiotics for now.  Author: Lynden Oxford, MD [facility] 10/31/2023 7:44 PM   If 7PM-7AM, please contact night-coverage at [email or web address]

## 2023-10-31 NOTE — Progress Notes (Signed)
 Pt refused am labs. Anthoney Harada, NP notified

## 2023-11-01 ENCOUNTER — Inpatient Hospital Stay (HOSPITAL_COMMUNITY): Payer: MEDICAID

## 2023-11-01 DIAGNOSIS — J15211 Pneumonia due to Methicillin susceptible Staphylococcus aureus: Secondary | ICD-10-CM | POA: Diagnosis not present

## 2023-11-01 NOTE — Progress Notes (Signed)
 [facility] PROGRESS NOTE  Patient: Pam Soto [id number]   PCP: Mechele Claude, MD DOB: 1991/07/23   DOA: 10/14/2023   DOS: 11/01/2023    Subjective: Reports chest pain.  No nausea no vomiting no fever no chills.  Objective:  Vitals:   10/31/23 1309 10/31/23 2021 11/01/23 0427 11/01/23 0834  BP: 125/88 109/80 105/70 110/74  Pulse: 86 73  63  Resp: 16 18 18 16   Temp: 98.2 F (36.8 C) 98.3 F (36.8 C) 98 F (36.7 C)   TempSrc: Oral  Oral   SpO2:  98% 97% 99%  Weight:      Height:       Diminished breath sounds on right.  Basal crackles as well. S1-S2 present. Trace edema. No focal deficit.  Assessment and plan: Empyema with MSSA bacteremia. Requiring IV antibiotics for 6 weeks. EOT 4/1 Chest x-ray reassuring.  Continue incentive spirometry.  Author: Lynden Oxford, MD [facility] 11/01/2023 5:21 PM   If 7PM-7AM, please contact night-coverage at [email or web address]

## 2023-11-02 DIAGNOSIS — J15211 Pneumonia due to Methicillin susceptible Staphylococcus aureus: Secondary | ICD-10-CM | POA: Diagnosis not present

## 2023-11-02 NOTE — Progress Notes (Signed)
 Patient became irate because this nurse went into the room to fix leads on her chest, the tele was still picking up however one lead was off.  After she received her Oxy IR, she decided to go to the bathroom.  This nurse came back in the room to fix her one lead and she became irate carrying on and yelling "I'm trying to sleep and this is taking all this time to fix.  If it's picking up just leave it!: "Ya'll keep coming in here waking me up"!  All is well with patient when we are coming in  to provide her with pain medications.

## 2023-11-02 NOTE — Plan of Care (Signed)

## 2023-11-02 NOTE — Progress Notes (Signed)
 [facility] PROGRESS NOTE  Patient: Pam Soto ZOX:[id number]   PCP: Mechele Claude, MD DOB: [birth year]   DOA: 10/14/2023   DOS: 11/02/2023    Subjective: No acute complaint.  No nausea no vomiting.  Ongoing complaint of right-sided chest pain.  Ongoing, no cough.  Objective:  Vitals:   11/01/23 2024 11/02/23 0527 11/02/23 0846 11/02/23 1359  BP: 109/74 112/74 109/78 112/74  Pulse: 67  79 65  Resp: 16 16 18 18   Temp: 98.3 F (36.8 C) 98.4 F (36.9 C) 98.4 F (36.9 C) 98.2 F (36.8 C)  TempSrc: Oral Oral    SpO2: 98% 96% 97% 97%  Weight:      Height:       Improving breath sound on right side. Assessment and plan: Chest x-ray shows improvement in fluid on right side. Continue IV antibiotic.  Author: Lynden Oxford, MD [facility] 11/02/2023 8:02 PM   If 7PM-7AM, please contact night-coverage at [email or web address]

## 2023-11-03 LAB — MISC LABCORP TEST (SEND OUT): Labcorp test code: 5367

## 2023-11-03 LAB — CBC
HCT: 29.6 % — ABNORMAL LOW (ref 36.0–46.0)
Hemoglobin: 9.5 g/dL — ABNORMAL LOW (ref 12.0–15.0)
MCH: 28.6 pg (ref 26.0–34.0)
MCHC: 32.1 g/dL (ref 30.0–36.0)
MCV: 89.2 fL (ref 80.0–100.0)
Platelets: 523 10*3/uL — ABNORMAL HIGH (ref 150–400)
RBC: 3.32 MIL/uL — ABNORMAL LOW (ref 3.87–5.11)
RDW: 12.5 % (ref 11.5–15.5)
WBC: 6.1 10*3/uL (ref 4.0–10.5)
nRBC: 0 % (ref 0.0–0.2)

## 2023-11-03 LAB — BASIC METABOLIC PANEL
Anion gap: 11 (ref 5–15)
BUN: 13 mg/dL (ref 6–20)
CO2: 20 mmol/L — ABNORMAL LOW (ref 22–32)
Calcium: 8.8 mg/dL — ABNORMAL LOW (ref 8.9–10.3)
Chloride: 106 mmol/L (ref 98–111)
Creatinine, Ser: 0.61 mg/dL (ref 0.44–1.00)
GFR, Estimated: >60 mL/min (ref >60)
Glucose, Bld: 75 mg/dL (ref 70–99)
Potassium: 4.1 mmol/L (ref 3.5–5.1)
Sodium: 137 mmol/L (ref 135–145)

## 2023-11-03 NOTE — Progress Notes (Signed)
 [facility] PROGRESS NOTE  Patient: Pam Soto [id number]:[id number]   PCP: Mechele Claude, MD DOB: 03/04/1991   DOA: 10/14/2023   DOS: 11/03/2023    Subjective: Pain well-controlled.  No nausea no vomiting.  Objective:  Vitals:   11/02/23 2019 11/03/23 0339 11/03/23 0835 11/03/23 1534  BP: 128/83 (!) 108/58 108/70 112/64  Pulse: 75 75 (!) 57 68  Resp: 16 14 18 18   Temp: 98.1 F (36.7 C) 98.5 F (36.9 C) 98.1 F (36.7 C) 97.8 F (36.6 C)  TempSrc:  Oral Oral Oral  SpO2: 99% 98% 96% 98%  Weight:      Height:       Alert awake and oriented. In no distress.  Assessment and plan: MSSA bacteremia with right chest empyema. Continue with IV antibiotic for now.  EOT 4/1.  Followed by oritavancin.  Author: Lynden Oxford, MD [facility] 11/03/2023 6:37 PM   If 7PM-7AM, please contact night-coverage at [email or web address]

## 2023-11-03 NOTE — Plan of Care (Signed)

## 2023-11-04 DIAGNOSIS — A4101 Sepsis due to Methicillin susceptible Staphylococcus aureus: Secondary | ICD-10-CM | POA: Diagnosis not present

## 2023-11-04 MED ORDER — ALUM & MAG HYDROXIDE-SIMETH 200-200-20 MG/5ML PO SUSP
30.0000 mL | ORAL | Status: DC | PRN
  Administered 2023-11-05 – 2023-11-07 (×3): 30 mL via ORAL
  Filled 2023-11-04 (×5): qty 30

## 2023-11-04 MED ORDER — ORITAVANCIN DIPHOSPHATE 400 MG IV SOLR
1200.0000 mg | Freq: Once | INTRAVENOUS | Status: AC
  Administered 2023-11-11: 1200 mg via INTRAVENOUS
  Filled 2023-11-04: qty 120

## 2023-11-04 NOTE — Progress Notes (Signed)
 [facility] Progress Note Patient: Pam Soto ZOX:[id number] DOB: 04/13/1991 DOA: 10/14/2023  DOS: the patient was seen and examined on 11/04/2023  Brief Hospital Course: PMH of IV drug abuse presented to the hospital, as a right-sided chest pain.  Found to have MSSA bacteremia, pneumonia as well as empyema. ID and [facility] were consulted. Underwent chest tube placement which was removed on 3/19. Assessment and Plan: Empyema with multifocal pneumonia. In the setting of MSSA bacteremia Presented with chest pain. No evidence of PE.  Found to have empyema. Pulmonary was consulted.  Underwent chest tube placement.  Multiple fibrinolytic therapy performed. Chest tube removed on 3/19. Repeat chest x-ray unremarkable.   MSSA bacteremia.  Sepsis. Multifocal pneumonia. ID following.  Met SIRS criteria on admission with leukocytosis, tachycardia and tachypnea. Echocardiogram and TEE negative for vegetation. ID recommending IV cefazolin for 4 weeks with EOT/1 followed by oritavancin for complete 6 weeks therapy. Currently on IV Unasyn.  Order for oritavancin placed for 4/1. MRI T-spine and L-spine negative for any discitis.   History of hep C infection. Will need outpatient therapy.   Hep B nonimmune status. Will require vaccine.   Polysubstance abuse, IV drug abuse. Chronic pain syndrome. Recently started on methadone outpatient, confirmed on admission. No UDS performed on admission.   Hypokalemia and hypomagnesemia. Replaced.   Prolonged QTc. Resolved.  QTc 450 on 3/20.   AKI. Baseline creatinine 0.5. Outpatient creatinine 1.21. Improved with IV hydration.   Anemia of chronic disease. Monitor H&H.  Currently stable.   Subjective: No nausea no vomiting or pain well-controlled.  No fever no chills.  Physical Exam: Clear to auscultation, diminished breath sounds on right. S1-S2 present. No edema. Alert awake and oriented x 3.  Data Reviewed: I have Reviewed nursing  notes, Vitals, and Lab results. Reviewed CBC and BMP.  Disposition: Status is: Inpatient Remains inpatient appropriate because: Currently requiring IV antibiotic  enoxaparin (LOVENOX) injection 40 mg Start: 10/15/23 1000   Family Communication: Family at bedside Level of care: Med-Surg   Vitals:   11/03/23 1534 11/03/23 2000 11/04/23 0247 11/04/23 1506  BP: 112/64 113/69 117/73 116/75  Pulse: 68 74 65 (!) 59  Resp: 18 18 16    Temp: 97.8 F (36.6 C) 97.7 F (36.5 C) 98 F (36.7 C) 98.3 F (36.8 C)  TempSrc: Oral Oral Oral Oral  SpO2: 98% 100% 97% 100%  Weight:      Height:         Author: Lynden Oxford, MD 11/04/2023 7:07 PM  Please look on [email or web address] to find out who is on call.

## 2023-11-04 NOTE — Plan of Care (Signed)
  Problem: Education: Goal: Knowledge of General Education information will improve Description: Including pain rating scale, medication(s)/side effects and non-pharmacologic comfort measures 11/04/2023 2032 by Sampson Goon, RN Outcome: Progressing 11/04/2023 1933 by Sampson Goon, RN Outcome: Progressing   Problem: Health Behavior/Discharge Planning: Goal: Ability to manage health-related needs will improve 11/04/2023 2032 by Sampson Goon, RN Outcome: Progressing 11/04/2023 1933 by Sampson Goon, RN Outcome: Progressing   Problem: Clinical Measurements: Goal: Ability to maintain clinical measurements within normal limits will improve 11/04/2023 2032 by Sampson Goon, RN Outcome: Progressing 11/04/2023 1933 by Sampson Goon, RN Outcome: Progressing Goal: Will remain free from infection 11/04/2023 2032 by Sampson Goon, RN Outcome: Progressing 11/04/2023 1933 by Sampson Goon, RN Outcome: Progressing Goal: Diagnostic test results will improve 11/04/2023 2032 by Sampson Goon, RN Outcome: Progressing 11/04/2023 1933 by Sampson Goon, RN Outcome: Progressing Goal: Respiratory complications will improve 11/04/2023 2032 by Sampson Goon, RN Outcome: Progressing 11/04/2023 1933 by Sampson Goon, RN Outcome: Progressing Goal: Cardiovascular complication will be avoided 11/04/2023 2032 by Sampson Goon, RN Outcome: Progressing 11/04/2023 1933 by Sampson Goon, RN Outcome: Progressing   Problem: Activity: Goal: Risk for activity intolerance will decrease 11/04/2023 2032 by Sampson Goon, RN Outcome: Progressing 11/04/2023 1933 by Sampson Goon, RN Outcome: Progressing   Problem: Nutrition: Goal: Adequate nutrition will be maintained 11/04/2023 2032 by Sampson Goon, RN Outcome: Progressing 11/04/2023 1933 by Sampson Goon, RN Outcome: Progressing   Problem: Coping: Goal: Level of anxiety will decrease 11/04/2023  2032 by Sampson Goon, RN Outcome: Progressing 11/04/2023 1933 by Sampson Goon, RN Outcome: Progressing   Problem: Elimination: Goal: Will not experience complications related to bowel motility 11/04/2023 2032 by Sampson Goon, RN Outcome: Progressing 11/04/2023 1933 by Sampson Goon, RN Outcome: Progressing Goal: Will not experience complications related to urinary retention 11/04/2023 2032 by Sampson Goon, RN Outcome: Progressing 11/04/2023 1933 by Sampson Goon, RN Outcome: Progressing   Problem: Pain Managment: Goal: General experience of comfort will improve and/or be controlled 11/04/2023 2032 by Sampson Goon, RN Outcome: Progressing 11/04/2023 1933 by Sampson Goon, RN Outcome: Progressing   Problem: Safety: Goal: Ability to remain free from injury will improve 11/04/2023 2032 by Sampson Goon, RN Outcome: Progressing 11/04/2023 1933 by Sampson Goon, RN Outcome: Progressing   Problem: Skin Integrity: Goal: Risk for impaired skin integrity will decrease 11/04/2023 2032 by Sampson Goon, RN Outcome: Progressing 11/04/2023 1933 by Sampson Goon, RN Outcome: Progressing   Problem: Activity: Goal: Ability to tolerate increased activity will improve 11/04/2023 2032 by Sampson Goon, RN Outcome: Progressing 11/04/2023 1933 by Sampson Goon, RN Outcome: Progressing   Problem: Clinical Measurements: Goal: Ability to maintain a body temperature in the normal range will improve 11/04/2023 2032 by Sampson Goon, RN Outcome: Progressing 11/04/2023 1933 by Sampson Goon, RN Outcome: Progressing   Problem: Respiratory: Goal: Ability to maintain adequate ventilation will improve 11/04/2023 2032 by Sampson Goon, RN Outcome: Progressing 11/04/2023 1933 by Sampson Goon, RN Outcome: Progressing Goal: Ability to maintain a clear airway will improve 11/04/2023 2032 by Sampson Goon, RN Outcome:  Progressing 11/04/2023 1933 by Sampson Goon, RN Outcome: Progressing

## 2023-11-05 DIAGNOSIS — A4101 Sepsis due to Methicillin susceptible Staphylococcus aureus: Secondary | ICD-10-CM | POA: Diagnosis not present

## 2023-11-05 MED ORDER — PROCHLORPERAZINE EDISYLATE 10 MG/2ML IJ SOLN
5.0000 mg | INTRAMUSCULAR | Status: DC | PRN
  Administered 2023-11-05 – 2023-11-10 (×14): 5 mg via INTRAVENOUS
  Filled 2023-11-05 (×10): qty 2
  Filled 2023-11-05: qty 1
  Filled 2023-11-05 (×4): qty 2

## 2023-11-05 NOTE — Plan of Care (Signed)

## 2023-11-05 NOTE — Progress Notes (Signed)
 CSW met with pt regarding SDOH: Housing, transportation, utilities.  Housing: pt reports she and her fiancee currently rent a room from a home owner.  They are able to pay the rent and do have a stable place to stay at this time.    Transportation: pt reports she has recently been connected to medicaid transportation and this is now working quite well.  She has transportation to methadone clinic and to all her medical appts.  Utilities: pt reports her utilities are included in her room rent.  No concerns at this time.  Daleen Squibb, MSW, LCSW 3/26/20254:24 PM

## 2023-11-05 NOTE — Progress Notes (Signed)
 [facility] PROGRESS NOTE    Progress Note  Pam SHEAFFER  AOZ:[id number] DOB: [birth year] DOA: 10/14/2023 PCP: Mechele Claude, MD     Brief Narrative:   Pam Soto is an 33 y.o. female past medical history of IV drug abuse presented to the hospital with right-sided chest pain found to have MSSA bacteremia as well as pneumonia and empyema.  ID and pulmonary was consulted she underwent chest tube placement which was eventually removed on 10/29/2023  Assessment/Plan:   Sepsis due to methicillin susceptible Staphylococcus aureus bacteremia: TEE was negative for vegetation. ID recommended 4 weeks of IV cefazolin followed by oritavancin  to complete 6 weeks. Currently on IV Unasyn. oritavancin ordered for 11/11/2023. MRI of the T-spine and L-spine negative for discitis.  Empyema/multifocal pneumonia in the setting of MSSA bacteremia: Status post chest tube placement with multiple thrombolytic therapy, chest tube removed on 10/29/2023. Chest x-ray is unremarkable.  History of hepatitis C: Follow-up with ID as an outpatient.  Polysubstance abuse/IV drug abuse: Recently started on methadone as an outpatient confirming admission.  Hypokalemia/hypomagnesemia: Repleted now improved.  Acute kidney injury: Likely hemodynamically mediated resolved with IV fluids.  Thrombocytosis: Likely due to infectious etiology now improving   DVT prophylaxis: lovenox Family Communication:none Status is: Inpatient Remains inpatient appropriate because: MSSA bacteremia    Code Status:     Code Status Orders  (From admission, onward)           Start     Ordered   10/15/23 0353  Full code  Continuous       Question:  By:  Answer:  Consent: discussion documented in EHR   10/15/23 0354           Code Status History     Date Active Date Inactive Code Status Order ID Comments User Context   02/15/2017 1837 02/16/2017 0603 Full Code [id number]  Eber Hong, MD ED         IV  Access:   Peripheral IV   Procedures and diagnostic studies:   No results found.   Medical Consultants:   None.   Subjective:    Pam Soto vomited yesterday was able to tolerate her diet this morning.  Objective:    Vitals:   11/04/23 1506 11/04/23 2010 11/05/23 0403 11/05/23 0958  BP: 116/75 131/87 108/63 117/70  Pulse: (!) 59 (!) 59 79 100  Resp:  16 16   Temp: 98.3 F (36.8 C) 98.8 F (37.1 C) 99.1 F (37.3 C) 99.4 F (37.4 C)  TempSrc: Oral  Oral Oral  SpO2: 100% 99% 98% 98%  Weight:      Height:       SpO2: 98 % O2 Flow Rate (L/min): 3 L/min   Intake/Output Summary (Last 24 hours) at 11/05/2023 1246 Last data filed at 11/04/2023 1900 Gross per 24 hour  Intake 240 ml  Output --  Net 240 ml   Filed Weights   10/14/23 2300 10/17/23 1151  Weight: 81.6 kg 81.6 kg    Exam: General exam: In no acute distress. Respiratory system: Good air movement and clear to auscultation. Cardiovascular system: S1 & S2 heard, RRR.   Gastrointestinal system: Abdomen is nondistended, soft and nontender.  Extremities: No pedal edema. Skin: No rashes, lesions or ulcers Psychiatry: Judgement and insight appear normal. Mood & affect appropriate.    Data Reviewed:    Labs: Basic Metabolic Panel: Recent Labs  Lab 11/03/23 1010  NA 137  K 4.1  CL 106  CO2 20*  GLUCOSE 75  BUN 13  CREATININE 0.61  CALCIUM 8.8*   GFR Estimated Creatinine Clearance: 113 mL/min (by C-G formula based on SCr of 0.61 mg/dL). Liver Function Tests: No results for input(s): "AST", "ALT", "ALKPHOS", "BILITOT", "PROT", "ALBUMIN" in the last 168 hours. No results for input(s): "LIPASE", "AMYLASE" in the last 168 hours. No results for input(s): "AMMONIA" in the last 168 hours. Coagulation profile No results for input(s): "INR", "PROTIME" in the last 168 hours. COVID-19 Labs  No results for input(s): "DDIMER", "FERRITIN", "LDH", "CRP" in the last 72 hours.  Lab Results   Component Value Date   SARSCOV2NAA NEGATIVE 10/14/2023    CBC: Recent Labs  Lab 11/03/23 1010  WBC 6.1  HGB 9.5*  HCT 29.6*  MCV 89.2  PLT 523*   Cardiac Enzymes: No results for input(s): "CKTOTAL", "CKMB", "CKMBINDEX", "TROPONINI" in the last 168 hours. BNP (last 3 results) No results for input(s): "PROBNP" in the last 8760 hours. CBG: No results for input(s): "GLUCAP" in the last 168 hours. D-Dimer: No results for input(s): "DDIMER" in the last 72 hours. Hgb A1c: No results for input(s): "HGBA1C" in the last 72 hours. Lipid Profile: No results for input(s): "CHOL", "HDL", "LDLCALC", "TRIG", "CHOLHDL", "LDLDIRECT" in the last 72 hours. Thyroid function studies: No results for input(s): "TSH", "T4TOTAL", "T3FREE", "THYROIDAB" in the last 72 hours.  Invalid input(s): "FREET3" Anemia work up: No results for input(s): "VITAMINB12", "FOLATE", "FERRITIN", "TIBC", "IRON", "RETICCTPCT" in the last 72 hours. Sepsis Labs: Recent Labs  Lab 11/03/23 1010  WBC 6.1   Microbiology No results found for this or any previous visit (from the past 240 hours).   Medications:    acetaminophen  1,000 mg Oral Q6H   vitamin B-12  1,000 mcg Oral Daily   enoxaparin (LOVENOX) injection  40 mg Subcutaneous Q24H   gabapentin  300 mg Oral TID   lidocaine  1 patch Transdermal Q24H   methadone  40 mg Oral Daily   nicotine  21 mg Transdermal Daily   pantoprazole  40 mg Oral Daily   polyethylene glycol  17 g Oral BID   senna-docusate  1 tablet Oral BID   sodium chloride flush  3 mL Intravenous Q12H   Continuous Infusions:  ampicillin-sulbactam (UNASYN) IV 3 g (11/05/23 1241)   [START ON 11/11/2023] Oritavancin Diphosphate (ORBACTIV) 1,200 mg in dextrose 5 % IVPB        LOS: 21 days   [name]  [facility]  11/05/2023, 12:46 PM

## 2023-11-06 DIAGNOSIS — A4101 Sepsis due to Methicillin susceptible Staphylococcus aureus: Secondary | ICD-10-CM | POA: Diagnosis not present

## 2023-11-06 NOTE — Progress Notes (Signed)
 [facility] PROGRESS NOTE    Progress Note  Pam Soto  ZOX:[id number] DOB: 01-Mar-1991 DOA: 10/14/2023 PCP: Mechele Claude, MD     Brief Narrative:   Pam Soto is an 33 y.o. female past medical history of IV drug abuse presented to the hospital with right-sided chest pain found to have MSSA bacteremia as well as pneumonia and empyema.  ID and pulmonary was consulted she underwent chest tube placement which was eventually removed on 10/29/2023  Assessment/Plan:   Sepsis due to methicillin susceptible Staphylococcus aureus bacteremia: TEE was negative for vegetation. ID recommended 4 weeks of IV cefazolin followed by oritavancin  to complete 6 weeks. Currently on IV Unasyn. oritavancin ordered for 11/11/2023. MRI of the T-spine and L-spine negative for discitis.  Empyema/multifocal pneumonia in the setting of MSSA bacteremia: Status post chest tube placement with multiple thrombolytic therapy, chest tube removed on 10/29/2023. Chest x-ray is unremarkable.  History of hepatitis C: Follow-up with ID as an outpatient.  Polysubstance abuse/IV drug abuse: Recently started on methadone as an outpatient confirming admission.  Hypokalemia/hypomagnesemia: Repleted now improved.  Acute kidney injury: Likely hemodynamically mediated resolved with IV fluids.  Thrombocytosis: Likely due to infectious etiology now improving   DVT prophylaxis: lovenox Family Communication:none Status is: Inpatient Remains inpatient appropriate because: MSSA bacteremia    Code Status:     Code Status Orders  (From admission, onward)           Start     Ordered   10/15/23 0353  Full code  Continuous       Question:  By:  Answer:  Consent: discussion documented in EHR   10/15/23 0354           Code Status History     Date Active Date Inactive Code Status Order ID Comments User Context   02/15/2017 1837 02/16/2017 0603 Full Code [id number]  Eber Hong, MD ED         IV  Access:   Peripheral IV   Procedures and diagnostic studies:   No results found.   Medical Consultants:   None.   Subjective:    Pam Soto complains this morning, no further vomiting.  Objective:    Vitals:   11/05/23 0403 11/05/23 0958 11/05/23 1942 11/06/23 0456  BP: 108/63 117/70 105/65 (!) 95/56  Pulse: 79 100 61 69  Resp: 16  17 17   Temp: 99.1 F (37.3 C) 99.4 F (37.4 C) 98.2 F (36.8 C) 98.4 F (36.9 C)  TempSrc: Oral Oral Oral Oral  SpO2: 98% 98% 97% 98%  Weight:      Height:       SpO2: 98 % O2 Flow Rate (L/min): 3 L/min   Intake/Output Summary (Last 24 hours) at 11/06/2023 1033 Last data filed at 11/05/2023 2000 Gross per 24 hour  Intake 360 ml  Output --  Net 360 ml   Filed Weights   10/14/23 2300 10/17/23 1151  Weight: 81.6 kg 81.6 kg    Exam: General exam: In no acute distress. Respiratory system: Good air movement and clear to auscultation. Cardiovascular system: S1 & S2 heard, RRR. No JVD. Gastrointestinal system: Abdomen is nondistended, soft and nontender.  Psychiatry: Judgement and insight appear normal. Mood & affect appropriate.  Data Reviewed:    Labs: Basic Metabolic Panel: Recent Labs  Lab 11/03/23 1010  NA 137  K 4.1  CL 106  CO2 20*  GLUCOSE 75  BUN 13  CREATININE 0.61  CALCIUM 8.8*  GFR Estimated Creatinine Clearance: 113 mL/min (by C-G formula based on SCr of 0.61 mg/dL). Liver Function Tests: No results for input(s): "AST", "ALT", "ALKPHOS", "BILITOT", "PROT", "ALBUMIN" in the last 168 hours. No results for input(s): "LIPASE", "AMYLASE" in the last 168 hours. No results for input(s): "AMMONIA" in the last 168 hours. Coagulation profile No results for input(s): "INR", "PROTIME" in the last 168 hours. COVID-19 Labs  No results for input(s): "DDIMER", "FERRITIN", "LDH", "CRP" in the last 72 hours.  Lab Results  Component Value Date   SARSCOV2NAA NEGATIVE 10/14/2023    CBC: Recent Labs  Lab  11/03/23 1010  WBC 6.1  HGB 9.5*  HCT 29.6*  MCV 89.2  PLT 523*   Cardiac Enzymes: No results for input(s): "CKTOTAL", "CKMB", "CKMBINDEX", "TROPONINI" in the last 168 hours. BNP (last 3 results) No results for input(s): "PROBNP" in the last 8760 hours. CBG: No results for input(s): "GLUCAP" in the last 168 hours. D-Dimer: No results for input(s): "DDIMER" in the last 72 hours. Hgb A1c: No results for input(s): "HGBA1C" in the last 72 hours. Lipid Profile: No results for input(s): "CHOL", "HDL", "LDLCALC", "TRIG", "CHOLHDL", "LDLDIRECT" in the last 72 hours. Thyroid function studies: No results for input(s): "TSH", "T4TOTAL", "T3FREE", "THYROIDAB" in the last 72 hours.  Invalid input(s): "FREET3" Anemia work up: No results for input(s): "VITAMINB12", "FOLATE", "FERRITIN", "TIBC", "IRON", "RETICCTPCT" in the last 72 hours. Sepsis Labs: Recent Labs  Lab 11/03/23 1010  WBC 6.1   Microbiology No results found for this or any previous visit (from the past 240 hours).   Medications:    acetaminophen  1,000 mg Oral Q6H   vitamin B-12  1,000 mcg Oral Daily   enoxaparin (LOVENOX) injection  40 mg Subcutaneous Q24H   gabapentin  300 mg Oral TID   lidocaine  1 patch Transdermal Q24H   methadone  40 mg Oral Daily   nicotine  21 mg Transdermal Daily   pantoprazole  40 mg Oral Daily   polyethylene glycol  17 g Oral BID   senna-docusate  1 tablet Oral BID   sodium chloride flush  3 mL Intravenous Q12H   Continuous Infusions:  ampicillin-sulbactam (UNASYN) IV 3 g (11/06/23 0531)   [START ON 11/11/2023] Oritavancin Diphosphate (ORBACTIV) 1,200 mg in dextrose 5 % IVPB        LOS: 22 days   Pam Soto  [facility]  11/06/2023, 10:33 AM

## 2023-11-07 DIAGNOSIS — A4101 Sepsis due to Methicillin susceptible Staphylococcus aureus: Secondary | ICD-10-CM | POA: Diagnosis not present

## 2023-11-07 NOTE — Plan of Care (Signed)

## 2023-11-07 NOTE — Progress Notes (Signed)
 [facility] PROGRESS NOTE    Progress Note  Pam Soto  ZOX:[id number] DOB: April 15, 1991 DOA: 10/14/2023 PCP: Mechele Claude, MD     Brief Narrative:   Pam Soto is an 33 y.o. female past medical history of IV drug abuse presented to the hospital with right-sided chest pain found to have MSSA bacteremia as well as pneumonia and empyema.  ID and pulmonary was consulted she underwent chest tube placement which was eventually removed on 10/29/2023.  Not a candidate for PICC line as an outpatient wound to finish treatment in house.  Assessment/Plan:   Sepsis due to methicillin susceptible Staphylococcus aureus bacteremia: TEE was negative for vegetation. ID recommended 4 weeks of IV cefazolin followed by oritavancin  to complete 6 weeks. Currently on IV Unasyn. oritavancin ordered for 11/11/2023. MRI of the T-spine and L-spine negative for discitis.  Empyema/multifocal pneumonia in the setting of MSSA bacteremia: Status post chest tube placement with multiple thrombolytic therapy, chest tube removed on 10/29/2023. Chest x-ray is unremarkable.  History of hepatitis C: Follow-up with ID as an outpatient.  Polysubstance abuse/IV drug abuse: Recently started on methadone as an outpatient confirming admission.  Hypokalemia/hypomagnesemia: Repleted now improved.  Acute kidney injury: Likely hemodynamically mediated resolved with IV fluids.  Thrombocytosis: Likely due to infectious etiology now improving   DVT prophylaxis: lovenox Family Communication:none Status is: Inpatient Remains inpatient appropriate because: MSSA bacteremia    Code Status:     Code Status Orders  (From admission, onward)           Start     Ordered   10/15/23 0353  Full code  Continuous       Question:  By:  Answer:  Consent: discussion documented in EHR   10/15/23 0354           Code Status History     Date Active Date Inactive Code Status Order ID Comments User Context    02/15/2017 1837 02/16/2017 0603 Full Code [id number]  Eber Hong, MD ED         IV Access:   Peripheral IV   Procedures and diagnostic studies:   No results found.   Medical Consultants:   None.   Subjective:    Pam Soto complains this morning, no further vomiting.  Objective:    Vitals:   11/06/23 1538 11/06/23 1954 11/07/23 0511 11/07/23 0844  BP: 112/64 109/66 (!) 103/56 102/61  Pulse: (!) 59 (!) 57 62 (!) 56  Resp: 17 18 18 17   Temp: 98.4 F (36.9 C) 98.6 F (37 C) 98 F (36.7 C) 99.2 F (37.3 C)  TempSrc:   Oral   SpO2: 100% 98% 97% 99%  Weight:      Height:       SpO2: 99 % O2 Flow Rate (L/min): 3 L/min  No intake or output data in the 24 hours ending 11/07/23 0939  Filed Weights   10/14/23 2300 10/17/23 1151  Weight: 81.6 kg 81.6 kg    Exam: General exam: In no acute distress. Respiratory system: Good air movement and clear to auscultation. Cardiovascular system: S1 & S2 heard, RRR. No JVD. Gastrointestinal system: Abdomen is nondistended, soft and nontender.  Psychiatry: Judgement and insight appear normal. Mood & affect appropriate.  Data Reviewed:    Labs: Basic Metabolic Panel: Recent Labs  Lab 11/03/23 1010  NA 137  K 4.1  CL 106  CO2 20*  GLUCOSE 75  BUN 13  CREATININE 0.61  CALCIUM 8.8*  GFR Estimated Creatinine Clearance: 113 mL/min (by C-G formula based on SCr of 0.61 mg/dL). Liver Function Tests: No results for input(s): "AST", "ALT", "ALKPHOS", "BILITOT", "PROT", "ALBUMIN" in the last 168 hours. No results for input(s): "LIPASE", "AMYLASE" in the last 168 hours. No results for input(s): "AMMONIA" in the last 168 hours. Coagulation profile No results for input(s): "INR", "PROTIME" in the last 168 hours. COVID-19 Labs  No results for input(s): "DDIMER", "FERRITIN", "LDH", "CRP" in the last 72 hours.  Lab Results  Component Value Date   SARSCOV2NAA NEGATIVE 10/14/2023    CBC: Recent Labs  Lab  11/03/23 1010  WBC 6.1  HGB 9.5*  HCT 29.6*  MCV 89.2  PLT 523*   Cardiac Enzymes: No results for input(s): "CKTOTAL", "CKMB", "CKMBINDEX", "TROPONINI" in the last 168 hours. BNP (last 3 results) No results for input(s): "PROBNP" in the last 8760 hours. CBG: No results for input(s): "GLUCAP" in the last 168 hours. D-Dimer: No results for input(s): "DDIMER" in the last 72 hours. Hgb A1c: No results for input(s): "HGBA1C" in the last 72 hours. Lipid Profile: No results for input(s): "CHOL", "HDL", "LDLCALC", "TRIG", "CHOLHDL", "LDLDIRECT" in the last 72 hours. Thyroid function studies: No results for input(s): "TSH", "T4TOTAL", "T3FREE", "THYROIDAB" in the last 72 hours.  Invalid input(s): "FREET3" Anemia work up: No results for input(s): "VITAMINB12", "FOLATE", "FERRITIN", "TIBC", "IRON", "RETICCTPCT" in the last 72 hours. Sepsis Labs: Recent Labs  Lab 11/03/23 1010  WBC 6.1   Microbiology No results found for this or any previous visit (from the past 240 hours).   Medications:    acetaminophen  1,000 mg Oral Q6H   vitamin B-12  1,000 mcg Oral Daily   enoxaparin (LOVENOX) injection  40 mg Subcutaneous Q24H   gabapentin  300 mg Oral TID   lidocaine  1 patch Transdermal Q24H   methadone  40 mg Oral Daily   nicotine  21 mg Transdermal Daily   pantoprazole  40 mg Oral Daily   polyethylene glycol  17 g Oral BID   senna-docusate  1 tablet Oral BID   sodium chloride flush  3 mL Intravenous Q12H   Continuous Infusions:  ampicillin-sulbactam (UNASYN) IV 3 g (11/07/23 0515)   [START ON 11/11/2023] Oritavancin Diphosphate (ORBACTIV) 1,200 mg in dextrose 5 % IVPB        LOS: 23 days   [name]  [facility]  11/07/2023, 9:39 AM

## 2023-11-08 DIAGNOSIS — I76 Septic arterial embolism: Secondary | ICD-10-CM | POA: Diagnosis not present

## 2023-11-08 DIAGNOSIS — A4101 Sepsis due to Methicillin susceptible Staphylococcus aureus: Secondary | ICD-10-CM | POA: Diagnosis not present

## 2023-11-08 DIAGNOSIS — J869 Pyothorax without fistula: Secondary | ICD-10-CM | POA: Diagnosis not present

## 2023-11-08 NOTE — Progress Notes (Signed)
 [facility] PROGRESS NOTE    Progress Note  Pam Soto  ZOX:[id number] DOB: 07-12-1991 DOA: 10/14/2023 PCP: Mechele Claude, MD     Brief Narrative:   Pam Soto is an 33 y.o. female past medical history of IV drug abuse presented to the hospital with right-sided chest pain found to have MSSA bacteremia as well as pneumonia and empyema.  ID and pulmonary was consulted she underwent chest tube placement which was eventually removed on 10/29/2023.  Not a candidate for PICC line as an outpatient wound to finish treatment in house.  Assessment/Plan:   Sepsis due to methicillin susceptible Staphylococcus aureus bacteremia: TEE was negative for vegetation. ID recommended 4 weeks of IV followed by oritavancin  to complete 6 weeks. Currently on IV Unasyn. oritavancin ordered for 11/11/2023. MRI of the T-spine and L-spine negative for discitis.  Empyema/multifocal pneumonia in the setting of MSSA bacteremia: Status post chest tube placement with multiple thrombolytic therapy, chest tube removed on 10/29/2023. Chest x-ray is unremarkable.  History of hepatitis C: Follow-up with ID as an outpatient.  Polysubstance abuse/IV drug abuse: Recently started on methadone as an outpatient confirming admission.  Hypokalemia/hypomagnesemia: Repleted now improved.  Acute kidney injury: Likely hemodynamically mediated resolved with IV fluids.  Thrombocytosis: Likely due to infectious etiology now improving   DVT prophylaxis: lovenox Family Communication:none Status is: Inpatient Remains inpatient appropriate because: MSSA bacteremia    Code Status:     Code Status Orders  (From admission, onward)           Start     Ordered   10/15/23 0353  Full code  Continuous       Question:  By:  Answer:  Consent: discussion documented in EHR   10/15/23 0354           Code Status History     Date Active Date Inactive Code Status Order ID Comments User Context   02/15/2017 1837  02/16/2017 0603 Full Code [id number]  Eber Hong, MD ED         IV Access:   Peripheral IV   Procedures and diagnostic studies:   No results found.   Medical Consultants:   None.   Subjective:    Pam R Soto no complaints this morning.  Objective:    Vitals:   11/07/23 1400 11/07/23 2232 11/08/23 0619 11/08/23 0831  BP: 111/72 106/67 109/63 108/63  Pulse: (!) 50 (!) 58 (!) 54 (!) 56  Resp: 17 18 16 16   Temp: 98.8 F (37.1 C) 98.4 F (36.9 C) 98.4 F (36.9 C) 98.8 F (37.1 C)  TempSrc: Oral Oral Oral Oral  SpO2:  99% 99% 99%  Weight:      Height:       SpO2: 99 % O2 Flow Rate (L/min): 3 L/min  No intake or output data in the 24 hours ending 11/08/23 1052  Filed Weights   10/14/23 2300 10/17/23 1151  Weight: 81.6 kg 81.6 kg    Exam: General exam: In no acute distress. Respiratory system: Good air movement and clear to auscultation. Cardiovascular system: S1 & S2 heard, RRR. No JVD. Gastrointestinal system: Abdomen is nondistended, soft and nontender.  Psychiatry: Judgement and insight appear normal. Mood & affect appropriate.  Data Reviewed:    Labs: Basic Metabolic Panel: Recent Labs  Lab 11/03/23 1010  NA 137  K 4.1  CL 106  CO2 20*  GLUCOSE 75  BUN 13  CREATININE 0.61  CALCIUM 8.8*   GFR Estimated Creatinine  Clearance: 113 mL/min (by C-G formula based on SCr of 0.61 mg/dL). Liver Function Tests: No results for input(s): "AST", "ALT", "ALKPHOS", "BILITOT", "PROT", "ALBUMIN" in the last 168 hours. No results for input(s): "LIPASE", "AMYLASE" in the last 168 hours. No results for input(s): "AMMONIA" in the last 168 hours. Coagulation profile No results for input(s): "INR", "PROTIME" in the last 168 hours. COVID-19 Labs  No results for input(s): "DDIMER", "FERRITIN", "LDH", "CRP" in the last 72 hours.  Lab Results  Component Value Date   SARSCOV2NAA NEGATIVE 10/14/2023    CBC: Recent Labs  Lab 11/03/23 1010  WBC 6.1   HGB 9.5*  HCT 29.6*  MCV 89.2  PLT 523*   Cardiac Enzymes: No results for input(s): "CKTOTAL", "CKMB", "CKMBINDEX", "TROPONINI" in the last 168 hours. BNP (last 3 results) No results for input(s): "PROBNP" in the last 8760 hours. CBG: No results for input(s): "GLUCAP" in the last 168 hours. D-Dimer: No results for input(s): "DDIMER" in the last 72 hours. Hgb A1c: No results for input(s): "HGBA1C" in the last 72 hours. Lipid Profile: No results for input(s): "CHOL", "HDL", "LDLCALC", "TRIG", "CHOLHDL", "LDLDIRECT" in the last 72 hours. Thyroid function studies: No results for input(s): "TSH", "T4TOTAL", "T3FREE", "THYROIDAB" in the last 72 hours.  Invalid input(s): "FREET3" Anemia work up: No results for input(s): "VITAMINB12", "FOLATE", "FERRITIN", "TIBC", "IRON", "RETICCTPCT" in the last 72 hours. Sepsis Labs: Recent Labs  Lab 11/03/23 1010  WBC 6.1   Microbiology No results found for this or any previous visit (from the past 240 hours).   Medications:    acetaminophen  1,000 mg Oral Q6H   vitamin B-12  1,000 mcg Oral Daily   enoxaparin (LOVENOX) injection  40 mg Subcutaneous Q24H   gabapentin  300 mg Oral TID   lidocaine  1 patch Transdermal Q24H   methadone  40 mg Oral Daily   nicotine  21 mg Transdermal Daily   pantoprazole  40 mg Oral Daily   polyethylene glycol  17 g Oral BID   senna-docusate  1 tablet Oral BID   sodium chloride flush  3 mL Intravenous Q12H   Continuous Infusions:  ampicillin-sulbactam (UNASYN) IV 3 g (11/08/23 1035)   [START ON 11/11/2023] Oritavancin Diphosphate (ORBACTIV) 1,200 mg in dextrose 5 % IVPB        LOS: 24 days   Pam Soto  [facility]  11/08/2023, 10:52 AM

## 2023-11-09 DIAGNOSIS — I76 Septic arterial embolism: Secondary | ICD-10-CM | POA: Diagnosis not present

## 2023-11-09 DIAGNOSIS — A4101 Sepsis due to Methicillin susceptible Staphylococcus aureus: Secondary | ICD-10-CM | POA: Diagnosis not present

## 2023-11-09 DIAGNOSIS — J869 Pyothorax without fistula: Secondary | ICD-10-CM | POA: Diagnosis not present

## 2023-11-09 NOTE — Progress Notes (Signed)
 [facility] PROGRESS NOTE    Progress Note  [name]  UJW:[id number] DOB: 22-Mar-1991 DOA: 10/14/2023 PCP: Mechele Claude, MD     Brief Narrative:   Pam Soto is an 33 y.o. female past medical history of IV drug abuse presented to the hospital with right-sided chest pain found to have MSSA bacteremia as well as pneumonia and empyema.  ID and pulmonary was consulted she underwent chest tube placement which was eventually removed on 10/29/2023.  Not a candidate for PICC line as an outpatient wound to finish treatment in house.  Assessment/Plan:   Sepsis due to methicillin susceptible Staphylococcus aureus bacteremia: TEE was negative for vegetation. ID recommended 4 weeks of IV followed by oritavancin  to complete 6 weeks. Currently on IV Unasyn. oritavancin ordered for 11/11/2023. MRI of the T-spine and L-spine negative for discitis.  Empyema/multifocal pneumonia in the setting of MSSA bacteremia: Status post chest tube placement with multiple thrombolytic therapy, chest tube removed on 10/29/2023. Chest x-ray is unremarkable.  History of hepatitis C: Follow-up with ID as an outpatient.  Polysubstance abuse/IV drug abuse: Recently started on methadone as an outpatient confirming admission.  Hypokalemia/hypomagnesemia: Repleted now improved.  Acute kidney injury: Likely hemodynamically mediated resolved with IV fluids.  Thrombocytosis: Likely due to infectious etiology now improving   DVT prophylaxis: lovenox Family Communication:none Status is: Inpatient Remains inpatient appropriate because: MSSA bacteremia    Code Status:     Code Status Orders  (From admission, onward)           Start     Ordered   10/15/23 0353  Full code  Continuous       Question:  By:  Answer:  Consent: discussion documented in EHR   10/15/23 0354           Code Status History     Date Active Date Inactive Code Status Order ID Comments User Context   02/15/2017 1837  02/16/2017 0603 Full Code [id number]  Eber Hong, MD ED         IV Access:   Peripheral IV   Procedures and diagnostic studies:   No results found.   Medical Consultants:   None.   Subjective:    Pam Soto no complaints this morning.  Objective:    Vitals:   11/08/23 0831 11/08/23 1514 11/08/23 2134 11/09/23 0521  BP: 108/63 101/62 101/64 105/60  Pulse: (!) 56 61 (!) 59 (!) 58  Resp: 16 16 18 16   Temp: 98.8 F (37.1 C) 98.4 F (36.9 C) 97.9 F (36.6 C) 99 F (37.2 C)  TempSrc: Oral Oral Oral Oral  SpO2: 99% 97% 98%   Weight:      Height:       SpO2: 98 % O2 Flow Rate (L/min): 3 L/min   Intake/Output Summary (Last 24 hours) at 11/09/2023 1026 Last data filed at 11/08/2023 1700 Gross per 24 hour  Intake 480 ml  Output --  Net 480 ml    Filed Weights   10/14/23 2300 10/17/23 1151  Weight: 81.6 kg 81.6 kg    Exam: General exam: In no acute distress. Respiratory system: Good air movement and clear to auscultation. Cardiovascular system: S1 & S2 heard, RRR. No JVD. Gastrointestinal system: Abdomen is nondistended, soft and nontender.  Psychiatry: Judgement and insight appear normal. Mood & affect appropriate.  Data Reviewed:    Labs: Basic Metabolic Panel: Recent Labs  Lab 11/03/23 1010  NA 137  K 4.1  CL 106  CO2 20*  GLUCOSE 75  BUN 13  CREATININE 0.61  CALCIUM 8.8*   GFR Estimated Creatinine Clearance: 113 mL/min (by C-G formula based on SCr of 0.61 mg/dL). Liver Function Tests: No results for input(s): "AST", "ALT", "ALKPHOS", "BILITOT", "PROT", "ALBUMIN" in the last 168 hours. No results for input(s): "LIPASE", "AMYLASE" in the last 168 hours. No results for input(s): "AMMONIA" in the last 168 hours. Coagulation profile No results for input(s): "INR", "PROTIME" in the last 168 hours. COVID-19 Labs  No results for input(s): "DDIMER", "FERRITIN", "LDH", "CRP" in the last 72 hours.  Lab Results  Component Value Date    SARSCOV2NAA NEGATIVE 10/14/2023    CBC: Recent Labs  Lab 11/03/23 1010  WBC 6.1  HGB 9.5*  HCT 29.6*  MCV 89.2  PLT 523*   Cardiac Enzymes: No results for input(s): "CKTOTAL", "CKMB", "CKMBINDEX", "TROPONINI" in the last 168 hours. BNP (last 3 results) No results for input(s): "PROBNP" in the last 8760 hours. CBG: No results for input(s): "GLUCAP" in the last 168 hours. D-Dimer: No results for input(s): "DDIMER" in the last 72 hours. Hgb A1c: No results for input(s): "HGBA1C" in the last 72 hours. Lipid Profile: No results for input(s): "CHOL", "HDL", "LDLCALC", "TRIG", "CHOLHDL", "LDLDIRECT" in the last 72 hours. Thyroid function studies: No results for input(s): "TSH", "T4TOTAL", "T3FREE", "THYROIDAB" in the last 72 hours.  Invalid input(s): "FREET3" Anemia work up: No results for input(s): "VITAMINB12", "FOLATE", "FERRITIN", "TIBC", "IRON", "RETICCTPCT" in the last 72 hours. Sepsis Labs: Recent Labs  Lab 11/03/23 1010  WBC 6.1   Microbiology No results found for this or any previous visit (from the past 240 hours).   Medications:    acetaminophen  1,000 mg Oral Q6H   vitamin B-12  1,000 mcg Oral Daily   enoxaparin (LOVENOX) injection  40 mg Subcutaneous Q24H   gabapentin  300 mg Oral TID   lidocaine  1 patch Transdermal Q24H   methadone  40 mg Oral Daily   nicotine  21 mg Transdermal Daily   pantoprazole  40 mg Oral Daily   polyethylene glycol  17 g Oral BID   senna-docusate  1 tablet Oral BID   sodium chloride flush  3 mL Intravenous Q12H   Continuous Infusions:  ampicillin-sulbactam (UNASYN) IV 3 g (11/09/23 0509)   [START ON 11/11/2023] Oritavancin Diphosphate (ORBACTIV) 1,200 mg in dextrose 5 % IVPB        LOS: 25 days   [name]  [facility]  11/09/2023, 10:26 AM

## 2023-11-09 NOTE — Plan of Care (Signed)

## 2023-11-10 DIAGNOSIS — A4101 Sepsis due to Methicillin susceptible Staphylococcus aureus: Secondary | ICD-10-CM | POA: Diagnosis not present

## 2023-11-10 DIAGNOSIS — I76 Septic arterial embolism: Secondary | ICD-10-CM | POA: Diagnosis not present

## 2023-11-10 DIAGNOSIS — J869 Pyothorax without fistula: Secondary | ICD-10-CM | POA: Diagnosis not present

## 2023-11-10 NOTE — TOC Progression Note (Signed)
 Transition of Care [facility]) - Progression Note    Patient Details  Name: Pam Soto MRN: [id number] Date of Birth: 04-Apr-1991  [facility] CM/SW Contact  Lorri Frederick, LCSW Phone Number: 11/10/2023, 1:01 PM  Clinical Narrative:   CSW spoke with pt regarding DC date. Pt is aware of DC tomorrow, is going to return to the room she rents upon discharge.  She has already made arrangements with her sister to come pick her up from the hospital.  No other issues related to DC.       Barriers to Discharge: Continued Medical Work up  Expected Discharge Plan and Services                                               Social Determinants of Health (SDOH) Interventions SDOH Screenings   Food Insecurity: No Food Insecurity (10/15/2023)  Housing: High Risk (10/15/2023)  Transportation Needs: Unmet Transportation Needs (10/15/2023)  Utilities: At Risk (10/15/2023)  Financial Resource Strain: Medium Risk (02/18/2023)   Received from [facility]  Social Connections: Moderately Integrated (10/15/2023)  Tobacco Use: High Risk (10/17/2023)  Health Literacy: Low Risk  (02/15/2021)   Received from [facility]    Readmission Risk Interventions     No data to display

## 2023-11-10 NOTE — Progress Notes (Signed)
 [facility] PROGRESS NOTE    Progress Note  Pam Soto  JYN:[id number] DOB: [birth year] DOA: 10/14/2023 PCP: Mechele Claude, MD     Brief Narrative:   Pam Soto is an 33 y.o. female past medical history of IV drug abuse presented to the hospital with right-sided chest pain found to have MSSA bacteremia as well as pneumonia and empyema.  ID and pulmonary was consulted she underwent chest tube placement which was eventually removed on 10/29/2023.  Not a candidate for PICC line as an outpatient wound to finish treatment in house.  Assessment/Plan:   Sepsis due to methicillin susceptible Staphylococcus aureus bacteremia: TEE was negative for vegetation. ID recommended 4 weeks of IV followed by oritavancin  to complete 6 weeks. Currently on IV Unasyn. Oritavancin ordered for 11/11/2023. MRI of the T-spine and L-spine negative for discitis.  Empyema/multifocal pneumonia in the setting of MSSA bacteremia: Status post chest tube placement with multiple thrombolytic therapy, chest tube removed on 10/29/2023. Chest x-ray is unremarkable.  History of hepatitis C: Follow-up with ID as an outpatient.  Polysubstance abuse/IV drug abuse: Recently started on methadone as an outpatient confirming admission.  Hypokalemia/hypomagnesemia: Repleted now improved.  Acute kidney injury: Likely hemodynamically mediated resolved with IV fluids.  Thrombocytosis: Likely due to infectious etiology now improving   DVT prophylaxis: lovenox Family Communication:none Status is: Inpatient Remains inpatient appropriate because: MSSA bacteremia    Code Status:     Code Status Orders  (From admission, onward)           Start     Ordered   10/15/23 0353  Full code  Continuous       Question:  By:  Answer:  Consent: discussion documented in EHR   10/15/23 0354           Code Status History     Date Active Date Inactive Code Status Order ID Comments User Context   02/15/2017 1837  02/16/2017 0603 Full Code [id number]  Eber Hong, MD ED         IV Access:   Peripheral IV   Procedures and diagnostic studies:   No results found.   Medical Consultants:   None.   Subjective:    Pam Soto no complaints today  Objective:    Vitals:   11/09/23 1534 11/09/23 1946 11/09/23 2002 11/10/23 0534  BP: 105/64 117/74  102/64  Pulse: 63 67  63  Resp: 15   16  Temp: 98.6 F (37 C)  98.1 F (36.7 C) 98.5 F (36.9 C)  TempSrc: Oral  Oral   SpO2: 98% 98%  98%  Weight:      Height:       SpO2: 98 % O2 Flow Rate (L/min): 3 L/min   Intake/Output Summary (Last 24 hours) at 11/10/2023 0933 Last data filed at 11/09/2023 1700 Gross per 24 hour  Intake 600 ml  Output --  Net 600 ml    Filed Weights   10/14/23 2300 10/17/23 1151  Weight: 81.6 kg 81.6 kg    Exam: General exam: In no acute distress. Respiratory system: Good air movement and clear to auscultation. Cardiovascular system: S1 & S2 heard, RRR. No JVD. Gastrointestinal system: Abdomen is nondistended, soft and nontender.  Extremities: No pedal edema. Skin: No rashes, lesions or ulcers Psychiatry: Judgement and insight appear normal. Mood & affect appropriate.  Data Reviewed:    Labs: Basic Metabolic Panel: Recent Labs  Lab 11/03/23 1010  NA 137  K 4.1  CL 106  CO2 20*  GLUCOSE 75  BUN 13  CREATININE 0.61  CALCIUM 8.8*   GFR Estimated Creatinine Clearance: 113 mL/min (by C-G formula based on SCr of 0.61 mg/dL). Liver Function Tests: No results for input(s): "AST", "ALT", "ALKPHOS", "BILITOT", "PROT", "ALBUMIN" in the last 168 hours. No results for input(s): "LIPASE", "AMYLASE" in the last 168 hours. No results for input(s): "AMMONIA" in the last 168 hours. Coagulation profile No results for input(s): "INR", "PROTIME" in the last 168 hours. COVID-19 Labs  No results for input(s): "DDIMER", "FERRITIN", "LDH", "CRP" in the last 72 hours.  Lab Results  Component Value  Date   SARSCOV2NAA NEGATIVE 10/14/2023    CBC: Recent Labs  Lab 11/03/23 1010  WBC 6.1  HGB 9.5*  HCT 29.6*  MCV 89.2  PLT 523*   Cardiac Enzymes: No results for input(s): "CKTOTAL", "CKMB", "CKMBINDEX", "TROPONINI" in the last 168 hours. BNP (last 3 results) No results for input(s): "PROBNP" in the last 8760 hours. CBG: No results for input(s): "GLUCAP" in the last 168 hours. D-Dimer: No results for input(s): "DDIMER" in the last 72 hours. Hgb A1c: No results for input(s): "HGBA1C" in the last 72 hours. Lipid Profile: No results for input(s): "CHOL", "HDL", "LDLCALC", "TRIG", "CHOLHDL", "LDLDIRECT" in the last 72 hours. Thyroid function studies: No results for input(s): "TSH", "T4TOTAL", "T3FREE", "THYROIDAB" in the last 72 hours.  Invalid input(s): "FREET3" Anemia work up: No results for input(s): "VITAMINB12", "FOLATE", "FERRITIN", "TIBC", "IRON", "RETICCTPCT" in the last 72 hours. Sepsis Labs: Recent Labs  Lab 11/03/23 1010  WBC 6.1   Microbiology No results found for this or any previous visit (from the past 240 hours).   Medications:    acetaminophen  1,000 mg Oral Q6H   vitamin B-12  1,000 mcg Oral Daily   enoxaparin (LOVENOX) injection  40 mg Subcutaneous Q24H   gabapentin  300 mg Oral TID   lidocaine  1 patch Transdermal Q24H   methadone  40 mg Oral Daily   nicotine  21 mg Transdermal Daily   pantoprazole  40 mg Oral Daily   polyethylene glycol  17 g Oral BID   senna-docusate  1 tablet Oral BID   sodium chloride flush  3 mL Intravenous Q12H   Continuous Infusions:  ampicillin-sulbactam (UNASYN) IV 3 g (11/10/23 0530)   [START ON 11/11/2023] Oritavancin Diphosphate (ORBACTIV) 1,200 mg in dextrose 5 % IVPB        LOS: 26 days   [name]  [facility]  11/10/2023, 9:33 AM

## 2023-11-11 DIAGNOSIS — A4101 Sepsis due to Methicillin susceptible Staphylococcus aureus: Secondary | ICD-10-CM | POA: Diagnosis not present

## 2023-11-11 DIAGNOSIS — J869 Pyothorax without fistula: Secondary | ICD-10-CM | POA: Diagnosis not present

## 2023-11-11 DIAGNOSIS — E876 Hypokalemia: Secondary | ICD-10-CM | POA: Diagnosis not present

## 2023-11-11 NOTE — Plan of Care (Signed)

## 2023-11-11 NOTE — Discharge Summary (Signed)
 Physician Discharge Summary  Pam Soto NUU:[id number] DOB: [birth year] DOA: 10/14/2023  PCP: Mechele Claude, MD  Admit date: 10/14/2023 Discharge date: 11/11/2023  Admitted From: Home Disposition:  Home  Recommendations for Outpatient Follow-up:  Follow up with PCP in 1-2 weeks Please obtain BMP/CBC in one week   Home Health:No Equipment/Devices:none  Discharge Condition:Stable CODE STATUS:Full Diet recommendation: Heart Healthy  Brief/Interim Summary: 33 y.o. female past medical history of IV drug abuse presented to the hospital with right-sided chest pain found to have MSSA bacteremia as well as pneumonia and empyema.  ID and pulmonary was consulted she underwent chest tube placement which was eventually removed on 10/29/2023.  Not a candidate for PICC line as an outpatient wound to finish treatment in house.   Discharge Diagnoses:  Principal Problem:   Sepsis due to methicillin susceptible Staphylococcus aureus (HCC) Active Problems:   Opiate dependence (HCC)   Hyponatremia   Hypokalemia   Hypomagnesemia   IV drug abuse (HCC)   MSSA bacteremia   Empyema lung (HCC)   Necrotic pneumonia (HCC)  Sepsis due to MSSA bacteremia: To start empirically on antibiotics TEE was done that was negative for agitation. ID was consulted recommended 6 weeks of antibiotics She completed a 4-week IV antibiotics in house.  She was given oritavancin in the last day. To complete the 2 additional weeks. MRI of the T-spine and L-spine was negative for discitis.  Empyema/multifocal pneumonia in the setting of MSSA bacteremia: PCCM was consulted she status post chest tube placement with multiple thrombolytic infusions Chest tube discontinued on 10/29/2023. Her chest x-ray is unremarkable.  History of otitis equal Follow-up with ID as an outpatient.  History of polysubstance abuse/IV drug abuse: She was continued on her current methadone dose.  Hypokalemia/hypomagnesemia: Repleted now  resolved.  Acute kidney injury: Resolved with IV fluids likely hemodynamically mediated  Thrombocytosis: Likely due to infectious etiology now resolved.  Discharge Instructions  Discharge Instructions     Diet - low sodium heart healthy   Complete by: As directed    Increase activity slowly   Complete by: As directed       Allergies as of 11/11/2023       Reactions   Tramadol Hives        Medication List     TAKE these medications    methadone 10 MG/ML solution Commonly known as: DOLOPHINE Take 40 mg by mouth daily.        Allergies  Allergen Reactions   Tramadol Hives    Consultations: Infectious disease Pulmonary and critical care   Procedures/Studies: DG Chest 2 View Result Date: 11/01/2023 CLINICAL DATA:  Empyema. EXAM: CHEST - 2 VIEW COMPARISON:  10/30/2023. FINDINGS: Trachea is midline. Heart size normal. Pleuroparenchymal opacification in the lower right hemithorax, stable. Streaky atelectasis in the left lower lobe. No pneumothorax. IMPRESSION: Small residual loculated right pleural effusion/empyema with mild right basilar pneumonia. Electronically Signed   By: Leanna Battles M.D.   On: 11/01/2023 15:22   DG CHEST PORT 1 VIEW Result Date: 10/30/2023 CLINICAL DATA:  Acute respiratory failure EXAM: PORTABLE CHEST 1 VIEW COMPARISON:  X-ray 10/29/2023 and older FINDINGS: Right-sided PICC catheter no longer seen. Persistent right-sided small pleural effusion and opacity. No pneumothorax. Left lung is grossly clear. Underinflation. Stable cardiopericardial silhouette. Overlapping cardiac leads. Surgical clips at the thoracic inlet on the right. IMPRESSION: Interval removal of the right basilar pigtail catheter. Stable adjacent opacity and small effusion. No pneumothorax. Electronically Signed   By: Karen Kays  M.D.   On: 10/30/2023 12:20   DG CHEST PORT 1 VIEW Result Date: 10/29/2023 CLINICAL DATA:  Pleural effusion. EXAM: PORTABLE CHEST 1 VIEW COMPARISON:   Radiograph yesterday FINDINGS: Right pigtail catheter unchanged in positioning. Elevated right hemidiaphragm with basilar opacity and small effusion, similar to prior. No pneumothorax. Minor atelectasis at the left lung base, improving left lung base aeration. Stable heart size and mediastinal contours. IMPRESSION: Unchanged right basilar opacity and small effusion with pigtail catheter in place. Electronically Signed   By: Narda Rutherford M.D.   On: 10/29/2023 15:19   DG CHEST PORT 1 VIEW Result Date: 10/28/2023 CLINICAL DATA:  Loculated pleural effusion. EXAM: PORTABLE CHEST 1 VIEW COMPARISON:  Chest radiograph dated 10/27/2023. FINDINGS: Chest tube in similar position. There is shallow inspiration. Bilateral mid to lower lung field densities as well as small bilateral pleural effusions similar to prior radiograph. No pneumothorax. Stable cardiac silhouette no acute osseous pathology. IMPRESSION: No significant interval change. Electronically Signed   By: Elgie Collard M.D.   On: 10/28/2023 13:31   DG CHEST PORT 1 VIEW Result Date: 10/27/2023 CLINICAL DATA:  Pleural effusion.  Right chest tube. EXAM: PORTABLE CHEST 1 VIEW COMPARISON:  Radiographs 10/26/2023 and 10/25/2023. Chest CT 10/23/2023. FINDINGS: 0533 hours. Small caliber right pleural pigtail drainage catheter is unchanged in position. Probable minimal residual pneumothorax laterally at the right lung base. Small right pleural effusion has nearly completely resolved. Patchy bibasilar pulmonary opacities are unchanged. The heart size and mediastinal contours are stable. No acute osseous findings are seen. IMPRESSION: Near complete resolution of right pleural effusion with probable minimal residual pneumothorax laterally at the right lung base. Stable patchy bibasilar pulmonary opacities. Electronically Signed   By: Carey Bullocks M.D.   On: 10/27/2023 09:32   DG CHEST PORT 1 VIEW Result Date: 10/26/2023 CLINICAL DATA:  Loculated pleural  effusion EXAM: PORTABLE CHEST 1 VIEW COMPARISON:  Chest radiographs 10/25/2023, 10/24/2023, 10/23/2023; CT chest 10/23/2023 FINDINGS: Redemonstration of inferior right pigtail drainage catheter. There is improved aeration of the right lower hemithorax, likely a combination of continued decrease in right pleural fluid and improved lung aeration. Note is made of a markedly loculated posterior right pleural effusion on prior 10/23/2023 CT. Minimal left basilar linear subsegmental atelectasis is unchanged. The cardiac silhouette and mediastinal contours are within normal limits for AP technique. Note is made of a small amount of right pleural air on prior 10/23/2023 CT. This is again not well visualized on radiograph. No acute skeletal abnormality. IMPRESSION: Redemonstration of inferior right pigtail drainage catheter. There is improved aeration of the right lower hemithorax compared to 10/25/2023 and 10/24/2023, likely a combination of continued decrease in right pleural fluid and improved lung aeration. Note is made of a markedly loculated posterior right pleural effusion on prior 10/23/2023 CT. Electronically Signed   By: Neita Garnet M.D.   On: 10/26/2023 09:20   DG CHEST PORT 1 VIEW Result Date: 10/25/2023 CLINICAL DATA:  Follow-up loculated right pleural effusion. EXAM: PORTABLE CHEST 1 VIEW COMPARISON:  10/24/2023 and 10/23/2023. FINDINGS: Stable right inferior hemithorax chest tube. Hazy opacity persists along the right mid lung with more confluent opacity at the right lung base. Milder linear opacities noted at the left lung base. Remainder of the left lung is clear. No pneumothorax. IMPRESSION: 1. No significant change from the previous day's study. 2. Stable right chest tube. No pneumothorax. 3. Persistent right mid and lower lung opacity consistent with a combination of pleural fluid and atelectasis/pneumonia. Electronically Signed  By: Amie Portland M.D.   On: 10/25/2023 11:47   DG CHEST PORT 1  VIEW Result Date: 10/24/2023 CLINICAL DATA:  Chest tube placement EXAM: PORTABLE CHEST 1 VIEW COMPARISON:  10/23/2023 FINDINGS: Interval placement of right basilar chest tube. No pneumothorax. Moderate right pleural effusion, slightly decreased since prior study. Diffuse right lung airspace disease, worsening since prior study. No confluent opacity on the left. IMPRESSION: Interval placement of right chest tube, no pneumothorax. Continued moderate right pleural effusion. Diffuse right lung airspace disease, worsening since prior study. Electronically Signed   By: Charlett Nose M.D.   On: 10/24/2023 22:32   MR Lumbar Spine W Wo Contrast Result Date: 10/24/2023 CLINICAL DATA:  Infection EXAM: MRI LUMBAR SPINE WITHOUT AND WITH CONTRAST TECHNIQUE: Multiplanar and multiecho pulse sequences of the lumbar spine were obtained without and with intravenous contrast. CONTRAST:  8mL GADAVIST GADOBUTROL 1 MMOL/ML IV SOLN COMPARISON:  None Available. FINDINGS: Segmentation:  Standard. Alignment:  Physiologic. Vertebrae: Vertebral body heights are maintained. No fracture. No evidence of discitis. No evidence of facet joint septic arthritis. Small benign intraosseous hemangiomas in the L3 and L4 vertebral bodies. No suspicious marrow replacing bone lesion. Conus medullaris and cauda equina: Conus extends to the T12 level. Conus and cauda equina appear normal. No abnormal epidural space collection. Paraspinal and other soft tissues: Negative. Disc levels: Negative. Intervertebral disc heights are preserved without disc desiccation or focal disc protrusion. Normal facet joints. No foraminal or canal stenosis at any level. IMPRESSION: Normal pre- and post-contrast MRI of the lumbar spine. No evidence of discitis or facet joint septic arthritis. Electronically Signed   By: Duanne Guess D.O.   On: 10/24/2023 19:43   MR THORACIC SPINE W WO CONTRAST Result Date: 10/24/2023 CLINICAL DATA:  Infection EXAM: MRI THORACIC WITHOUT AND  WITH CONTRAST TECHNIQUE: Multiplanar and multiecho pulse sequences of the thoracic spine were obtained without and with intravenous contrast. CONTRAST:  8mL GADAVIST GADOBUTROL 1 MMOL/ML IV SOLN COMPARISON:  Chest CT 10/23/2023 FINDINGS: Alignment:  Physiologic. Vertebrae: No acute fracture. No evidence of discitis. No evidence of facet joint septic arthritis. No marrow replacing bone lesion. Cord: Normal signal and morphology. No abnormal collection in the epidural space. Paraspinal and other soft tissues: Large complex, multiloculated fluid and air collection in the right pleural space with consolidation of the right lower lobe. No posterior paraspinal abnormality. Disc levels: Shallow right paracentral disc protrusion at T2-T3. Remaining discs demonstrate no significant protrusions. Facet joints within normal limits. Canal and foramina are widely patent at all levels. IMPRESSION: 1. No evidence of discitis-osteomyelitis or septic facet arthritis of the thoracic spine. 2. Large complex, multiloculated fluid and air collection in the right pleural space with consolidation of the right lower lobe. See recent dedicated CT chest. 3. Shallow right paracentral disc protrusion at T2-T3. No canal or foraminal stenosis at any level. Electronically Signed   By: Duanne Guess D.O.   On: 10/24/2023 19:35   CT CHEST WO CONTRAST Result Date: 10/23/2023 CLINICAL DATA:  Pneumonia, complication suspected, xray done EXAM: CT CHEST WITHOUT CONTRAST TECHNIQUE: Multidetector CT imaging of the chest was performed following the standard protocol without IV contrast. RADIATION DOSE REDUCTION: This exam was performed according to the departmental dose-optimization program which includes automated exposure control, adjustment of the mA and/or kV according to patient size and/or use of iterative reconstruction technique. COMPARISON:  Chest CT 10/19/2023 FINDINGS: Cardiovascular: The heart is normal in size. No pericardial effusion.  The thoracic aorta is normal in  caliber. Mediastinum/Nodes: Limited assessment for adenopathy in the absence of IV contrast. Small mediastinal and epicardial lymph nodes. Small axillary lymph nodes. Decompressed esophagus. Lungs/Pleura: Moderate loculated right hydropneumothorax. Pneumothorax component is small and visualized at the apex and anterior lung base. Complete opacification of the right lower lobe. Bubbly air peripherally, unclear if this is within pleural fluid or lung parenchyma. There is partial opacification in the right middle lobe. Small left pleural effusion is new. Bandlike atelectasis in the left lower lobe. Upper Abdomen: No acute findings. Musculoskeletal: There are no acute or suspicious osseous abnormalities. IMPRESSION: 1. Moderate loculated right hydropneumothorax. Pneumothorax component is small, may be related to recent thoracentesis. 2. Complete opacification of the right lower lobe, suspicious for pneumonia. Bubbly air peripherally, unclear if this is within pleural fluid or lung parenchyma, raising concern for necrotic pneumonia if intraparenchymal. 3. Partial opacification of the right middle lobe. 4. Small left pleural effusion is new. Bandlike atelectasis in the left lower lobe. Electronically Signed   By: Narda Rutherford M.D.   On: 10/23/2023 21:05   DG CHEST PORT 1 VIEW Result Date: 10/23/2023 CLINICAL DATA:  Pneumonia. EXAM: PORTABLE CHEST 1 VIEW COMPARISON:  October 21, 2023. FINDINGS: The heart size and mediastinal contours are within normal limits. Left lung is clear. Stable right pleural effusion with associated atelectasis. The visualized skeletal structures are unremarkable. IMPRESSION: Stable right pleural effusion with associated atelectasis. Electronically Signed   By: Lupita Raider M.D.   On: 10/23/2023 10:02   IR THORACENTESIS ASP PLEURAL SPACE W/IMG GUIDE Result Date: 10/21/2023 INDICATION: 33 year old female. Presented to ED with right-sided chest pain and  shortness of breath. Found to have a right-sided pleural effusion. Request is for therapeutic and diagnostic right-sided thoracentesis EXAM: ULTRASOUND GUIDED RIGHT-SIDED THERAPEUTIC AND DIAGNOSTIC THORACENTESIS MEDICATIONS: Lidocaine 1% 10 mL COMPLICATIONS: None immediate. PROCEDURE: An ultrasound guided thoracentesis was thoroughly discussed with the patient and questions answered. The benefits, risks, alternatives and complications were also discussed. The patient understands and wishes to proceed with the procedure. Written consent was obtained. Ultrasound was performed to localize and mark an adequate pocket of fluid in the right chest. The area was then prepped and draped in the normal sterile fashion. 1% Lidocaine was used for local anesthesia. Under ultrasound guidance a 6 Fr Safe-T-Centesis catheter was introduced. Thoracentesis was performed. The catheter was removed and a dressing applied. FINDINGS: A total of approximately 300 mL of amber fluid was removed. Samples were sent to the laboratory as requested by the clinical team. IMPRESSION: Successful ultrasound guided therapeutic and diagnostic right-sided thoracentesis yielding 300 mL of pleural fluid. Performed by Anders Grant NP Electronically Signed   By: Judie Petit.  Shick M.D.   On: 10/21/2023 13:07   DG Chest 1 View Result Date: 10/21/2023 CLINICAL DATA:  Status post right-sided thoracentesis EXAM: PORTABLE CHEST 1 VIEW COMPARISON:  10/19/2023 FINDINGS: Cardiac shadow is within normal limits. Left lung is clear. Small persistent right-sided effusion is noted decreased when compared with the prior CT examination. No pneumothorax is identified. Postsurgical changes are noted at the thoracic inlet. IMPRESSION: No evidence of pneumothorax following right-sided thoracentesis. Electronically Signed   By: Alcide Clever M.D.   On: 10/21/2023 11:49   CT CHEST W CONTRAST Result Date: 10/19/2023 CLINICAL DATA:  Pneumonia, septic emboli, worsening pleuritic  pain, persistent fever. EXAM: CT CHEST WITH CONTRAST TECHNIQUE: Multidetector CT imaging of the chest was performed during intravenous contrast administration. RADIATION DOSE REDUCTION: This exam was performed according to the departmental  dose-optimization program which includes automated exposure control, adjustment of the mA and/or kV according to patient size and/or use of iterative reconstruction technique. CONTRAST:  75mL OMNIPAQUE IOHEXOL 300 MG/ML  SOLN COMPARISON:  10/14/2023 chest CT angiogram. FINDINGS: Cardiovascular: Normal heart size. No significant pericardial effusion/thickening. Great vessels are normal in course and caliber. No central pulmonary emboli. Mediastinum/Nodes: No significant thyroid nodules. Unremarkable esophagus. No pathologically enlarged axillary, mediastinal or hilar lymph nodes. Lungs/Pleura: No pneumothorax. Loculated small to moderate anterior, posterior and major fissural right pleural effusion, new from prior chest CT. No left pleural effusion. Near complete right lower lobe lung consolidation with some volume loss, substantially worsened. New moderate right middle lobe consolidation with volume loss. Subsegmental atelectasis in the posterior upper lobes bilaterally and in the left lower lobe. Upper abdomen: No acute abnormality. Musculoskeletal:  No aggressive appearing focal osseous lesions. IMPRESSION: 1. Loculated small to moderate anterior, posterior and major fissural right pleural effusion, new from prior chest CT. 2. Near complete right lower lobe lung consolidation with some volume loss, substantially worsened. New moderate right middle lobe consolidation with volume loss. Findings are compatible with worsened multilobar pneumonia. 3. Subsegmental atelectasis in the posterior upper lobes bilaterally and in the left lower lobe. Electronically Signed   By: Delbert Phenix M.D.   On: 10/19/2023 13:32   ECHO TEE Result Date: 10/17/2023    TRANSESOPHOGEAL ECHO REPORT    Patient Name:   Pam Soto Date of Exam: 10/17/2023 Medical Rec #:  [id number]      Height:       67.0 in Accession #:    [id number]     Weight:       180.0 lb Date of Birth:  1991/05/03       BSA:          1.934 m Patient Age:    32 years       BP:           131/88 mmHg Patient Gender: F              HR:           80 bpm. [facility] Procedure: Cardiac Doppler, Color Doppler and Transesophageal Echo (Both            Spectral and Color Flow Doppler were utilized during procedure). Indications:    Bacteremia R78.81  History:        Patient has prior history of Echocardiogram examinations, most                 recent 10/15/2023. Hx of IV drug Use.  Sonographer:    Celesta Gentile RCS Referring Phys: [id number] Ellsworth Lennox PROCEDURE: The transesophogeal probe was passed without difficulty through the esophogus of the patient. Imaged were obtained with the patient in a left lateral decubitus position. Sedation performed by different physician. Image quality was good. The patient's vital signs; including heart rate, blood pressure, and oxygen saturation; remained stable throughout the procedure. Supplementary images were obtained from transthoracic windows as indicated to answer the clinical question. The patient developed no complications during the procedure.  IMPRESSIONS  1. Left ventricular ejection fraction, by estimation, is 60 to 65%. The left ventricle has normal function. The left ventricle has no regional wall motion abnormalities. Left ventricular diastolic function could not be evaluated.  2. Right ventricular systolic function is normal. The right ventricular size is normal.  3. No left atrial/left atrial appendage thrombus was detected. The LAA  emptying velocity was 90 cm/s.  4. The mitral valve is normal in structure. Trivial mitral valve regurgitation. No evidence of mitral stenosis.  5. The aortic valve is tricuspid. Aortic valve regurgitation is not visualized. No aortic stenosis is  present. Conclusion(s)/Recommendation(s): No evidence of vegetation/infective endocarditis on this transesophageael echocardiogram. FINDINGS  Left Ventricle: Left ventricular ejection fraction, by estimation, is 60 to 65%. The left ventricle has normal function. The left ventricle has no regional wall motion abnormalities. Strain was performed and the global longitudinal strain is indeterminate. The left ventricular internal cavity size was normal in size. There is no left ventricular hypertrophy. Left ventricular diastolic function could not be evaluated. Right Ventricle: The right ventricular size is normal. No increase in right ventricular wall thickness. Right ventricular systolic function is normal. Left Atrium: Left atrial size was not assessed. No left atrial/left atrial appendage thrombus was detected. The LAA emptying velocity was 90 cm/s. Right Atrium: Right atrial size was not assessed. Pericardium: There is no evidence of pericardial effusion. Mitral Valve: The mitral valve is normal in structure. Trivial mitral valve regurgitation. No evidence of mitral valve stenosis. Tricuspid Valve: The tricuspid valve is normal in structure. Tricuspid valve regurgitation is trivial. No evidence of tricuspid stenosis. Aortic Valve: The aortic valve is tricuspid. Aortic valve regurgitation is not visualized. No aortic stenosis is present. Pulmonic Valve: The pulmonic valve was normal in structure. Pulmonic valve regurgitation is not visualized. No evidence of pulmonic stenosis. Aorta: The aortic root is normal in size and structure. Venous: The inferior vena cava was not well visualized. IAS/Shunts: No atrial level shunt detected by color flow Doppler. Additional Comments: Spectral Doppler performed. Vishnu Priya Mallipeddi Electronically signed by Winfield Rast Mallipeddi Signature Date/Time: 10/17/2023/3:04:14 PM    Final    ECHOCARDIOGRAM COMPLETE Result Date: 10/15/2023    ECHOCARDIOGRAM REPORT   Patient Name:    Pam Soto Date of Exam: 10/15/2023 Medical Rec #:  [id number]      Height:       67.0 in Accession #:    [id number]     Weight:       180.0 lb Date of Birth:  11/12/1990       BSA:          1.934 m Patient Age:    32 years       BP:           106/72 mmHg Patient Gender: F              HR:           88 bpm. [facility] Location[facility] Procedure: 2D Echo, Cardiac Doppler and Color Doppler (Both Spectral and Color            Flow Doppler were utilized during procedure). Indications:    Septic embolism [facility]) [[id number]]  History:        Patient has no prior history of Echocardiogram examinations. IV                 drug Use.  Sonographer:    Webb Laws Referring Phys: [id number] TIMOTHY S OPYD IMPRESSIONS  1. Left ventricular ejection fraction, by estimation, is 60 to 65%. The left ventricle has normal function. The left ventricle has no regional wall motion abnormalities. There is mild left ventricular hypertrophy. Left ventricular diastolic parameters were normal.  2. Right ventricular systolic function is normal. The right ventricular size is normal.  3. The mitral valve is normal in structure. Mild  mitral valve regurgitation.  4. The aortic valve is normal in structure. Aortic valve regurgitation is not visualized.  5. The inferior vena cava is dilated in size with <50% respiratory variability, suggesting right atrial pressure of 15 mmHg. FINDINGS  Left Ventricle: Left ventricular ejection fraction, by estimation, is 60 to 65%. The left ventricle has normal function. The left ventricle has no regional wall motion abnormalities. The left ventricular internal cavity size was normal in size. There is  mild left ventricular hypertrophy. Left ventricular diastolic parameters were normal. Right Ventricle: The right ventricular size is normal. Right vetricular wall thickness was not assessed. Right ventricular systolic function is normal. Left Atrium: Left atrial size was normal in size. Right Atrium: Right atrial size was  normal in size. Pericardium: There is no evidence of pericardial effusion. Mitral Valve: The mitral valve is normal in structure. Mild mitral valve regurgitation. Tricuspid Valve: The tricuspid valve is normal in structure. Tricuspid valve regurgitation is trivial. Aortic Valve: The aortic valve is normal in structure. Aortic valve regurgitation is not visualized. Pulmonic Valve: The pulmonic valve was normal in structure. Pulmonic valve regurgitation is not visualized. Aorta: The aortic root and ascending aorta are structurally normal, with no evidence of dilitation. Venous: The inferior vena cava is dilated in size with less than 50% respiratory variability, suggesting right atrial pressure of 15 mmHg. IAS/Shunts: No atrial level shunt detected by color flow Doppler.  LEFT VENTRICLE PLAX 2D LVIDd:         4.80 cm   Diastology LVIDs:         3.40 cm   LV e' medial:    11.70 cm/s LV PW:         1.20 cm   LV E/e' medial:  7.6 LV IVS:        1.00 cm   LV e' lateral:   17.00 cm/s LVOT diam:     2.10 cm   LV E/e' lateral: 5.2 LV SV:         65 LV SV Index:   34 LVOT Area:     3.46 cm  RIGHT VENTRICLE             IVC RV Basal diam:  4.10 cm     IVC diam: 3.20 cm RV S prime:     14.20 cm/s TAPSE (M-mode): 2.3 cm LEFT ATRIUM             Index        RIGHT ATRIUM           Index LA diam:        3.20 cm 1.65 cm/m   RA Area:     13.50 cm LA Vol (A2C):   31.2 ml 16.13 ml/m  RA Volume:   34.60 ml  17.89 ml/m LA Vol (A4C):   39.6 ml 20.48 ml/m LA Biplane Vol: 37.2 ml 19.24 ml/m  AORTIC VALVE LVOT Vmax:   120.00 cm/s LVOT Vmean:  77.600 cm/s LVOT VTI:    0.188 m  AORTA Ao Root diam: 2.50 cm Ao Asc diam:  2.60 cm MITRAL VALVE MV Area (PHT): 3.77 cm    SHUNTS MV Decel Time: 201 msec    Systemic VTI:  0.19 m MV E velocity: 89.20 cm/s  Systemic Diam: 2.10 cm MV A velocity: 60.20 cm/s MV E/A ratio:  1.48 Dietrich Pates MD Electronically signed by Dietrich Pates MD Signature Date/Time: 10/15/2023/3:03:21 PM    Final    CT Angio Chest  PE  W and/or Wo Contrast Addendum Date: 10/15/2023 ADDENDUM REPORT: 10/15/2023 01:21 ADDENDUM: Upon further evaluation and receipt of additional patient history, the previously noted multifocal infiltrates seen within the right lobe likely represents sequelae associated with septic emboli. Electronically Signed   By: Aram Candela M.D.   On: 10/15/2023 01:21   Result Date: 10/15/2023 CLINICAL DATA:  Right-sided chest pain with shortness of breath and leg cramping. EXAM: CT ANGIOGRAPHY CHEST WITH CONTRAST TECHNIQUE: Multidetector CT imaging of the chest was performed using the standard protocol during bolus administration of intravenous contrast. Multiplanar CT image reconstructions and MIPs were obtained to evaluate the vascular anatomy. RADIATION DOSE REDUCTION: This exam was performed according to the departmental dose-optimization program which includes automated exposure control, adjustment of the mA and/or kV according to patient size and/or use of iterative reconstruction technique. CONTRAST:  75mL OMNIPAQUE IOHEXOL 350 MG/ML SOLN COMPARISON:  None Available. FINDINGS: Cardiovascular: The thoracic aorta is normal in appearance. Satisfactory opacification of the pulmonary arteries to the segmental level. No evidence of pulmonary embolism. Normal heart size. No pericardial effusion. Mediastinum/Nodes: Mild right hilar lymphadenopathy is suspected. Thyroid gland, trachea, and esophagus demonstrate no significant findings. Lungs/Pleura: Mild multifocal infiltrates are seen with within the right lower lobe 0. No pleural effusion or pneumothorax is identified. Upper Abdomen: No acute abnormality. Musculoskeletal: No chest wall abnormality. No acute or significant osseous findings. Review of the MIP images confirms the above findings. IMPRESSION: 1. No evidence of pulmonary embolism. 2. Mild multifocal right lower lobe infiltrates. Electronically Signed: By: Aram Candela M.D. On: 10/15/2023 00:56   DG  Chest 2 View Result Date: 10/14/2023 CLINICAL DATA:  Chest pain and shortness of breath EXAM: CHEST - 2 VIEW COMPARISON:  01/18/2023 FINDINGS: Cardiac shadow is within normal limits. Lungs are well aerated bilaterally. Mild right lower lobe opacity is noted anteriorly consistent with early infiltrate. No effusion is seen. No bony abnormality is noted. IMPRESSION: Mild right lower lobe infiltrate. Electronically Signed   By: Alcide Clever M.D.   On: 10/14/2023 22:58   (Echo, Carotid, EGD, Colonoscopy, ERCP)    Subjective: No complaints  Discharge Exam: Vitals:   11/11/23 0546 11/11/23 0829  BP: 103/72 101/62  Pulse: 93 (!) 54  Resp: 18 17  Temp: 98.6 F (37 C) 98.6 F (37 C)  SpO2: (!) 87% 100%   Vitals:   11/10/23 1630 11/10/23 1940 11/11/23 0546 11/11/23 0829  BP: 106/68 111/67 103/72 101/62  Pulse: 73 71 93 (!) 54  Resp: 17 18 18 17   Temp: 98.6 F (37 C) 98.7 F (37.1 C) 98.6 F (37 C) 98.6 F (37 C)  TempSrc: Oral  Oral Oral  SpO2: 99% 99% (!) 87% 100%  Weight:      Height:        General: Pt is alert, awake, not in acute distress Cardiovascular: RRR, S1/S2 +, no rubs, no gallops Respiratory: CTA bilaterally, no wheezing, no rhonchi Abdominal: Soft, NT, ND, bowel sounds + Extremities: no edema, no cyanosis    The results of significant diagnostics from this hospitalization (including imaging, microbiology, ancillary and laboratory) are listed below for reference.     Microbiology: No results found for this or any previous visit (from the past 240 hours).   Labs: BNP (last 3 results) No results for input(s): "BNP" in the last 8760 hours. Basic Metabolic Panel: No results for input(s): "NA", "K", "CL", "CO2", "GLUCOSE", "BUN", "CREATININE", "CALCIUM", "MG", "PHOS" in the last 168 hours. Liver Function Tests: No results for  input(s): "AST", "ALT", "ALKPHOS", "BILITOT", "PROT", "ALBUMIN" in the last 168 hours. No results for input(s): "LIPASE", "AMYLASE" in the  last 168 hours. No results for input(s): "AMMONIA" in the last 168 hours. CBC: No results for input(s): "WBC", "NEUTROABS", "HGB", "HCT", "MCV", "PLT" in the last 168 hours. Cardiac Enzymes: No results for input(s): "CKTOTAL", "CKMB", "CKMBINDEX", "TROPONINI" in the last 168 hours. BNP: Invalid input(s): "POCBNP" CBG: No results for input(s): "GLUCAP" in the last 168 hours. D-Dimer No results for input(s): "DDIMER" in the last 72 hours. Hgb A1c No results for input(s): "HGBA1C" in the last 72 hours. Lipid Profile No results for input(s): "CHOL", "HDL", "LDLCALC", "TRIG", "CHOLHDL", "LDLDIRECT" in the last 72 hours. Thyroid function studies No results for input(s): "TSH", "T4TOTAL", "T3FREE", "THYROIDAB" in the last 72 hours.  Invalid input(s): "FREET3" Anemia work up No results for input(s): "VITAMINB12", "FOLATE", "FERRITIN", "TIBC", "IRON", "RETICCTPCT" in the last 72 hours. Urinalysis No results found for: "COLORURINE", "APPEARANCEUR", "LABSPEC", "PHURINE", "GLUCOSEU", "HGBUR", "BILIRUBINUR", "KETONESUR", "PROTEINUR", "UROBILINOGEN", "NITRITE", "LEUKOCYTESUR" Sepsis Labs No results for input(s): "WBC" in the last 168 hours.  Invalid input(s): "PROCALCITONIN", "LACTICIDVEN" Microbiology No results found for this or any previous visit (from the past 240 hours).   Time coordinating discharge: Over 35 minutes  SIGNED:   [name], MD  [facility] 11/11/2023, 10:31 AM Pager   If 7PM-7AM, please contact night-coverage [email or web address] Password [email or web address]

## 2023-11-11 NOTE — Plan of Care (Signed)

## 2023-11-11 NOTE — Progress Notes (Signed)
 AVS, completed and placed with patient chart.  IV medications required before discharge; RN states it will take 4 hrs for Antibx to run.

## 2023-11-18 ENCOUNTER — Inpatient Hospital Stay: Payer: Self-pay | Admitting: Infectious Diseases

## 2023-11-20 ENCOUNTER — Inpatient Hospital Stay: Admitting: Infectious Diseases

## 2023-11-21 LAB — FUNGAL ORGANISM REFLEX

## 2023-11-21 LAB — FUNGUS CULTURE WITH STAIN

## 2023-11-21 LAB — FUNGUS CULTURE RESULT

## 2023-12-01 ENCOUNTER — Inpatient Hospital Stay: Payer: MEDICAID | Admitting: Infectious Diseases

## 2023-12-04 LAB — ACID FAST CULTURE WITH REFLEXED SENSITIVITIES (MYCOBACTERIA): Acid Fast Culture: NEGATIVE

## 2024-01-01 ENCOUNTER — Encounter (HOSPITAL_COMMUNITY): Payer: Self-pay | Admitting: Internal Medicine
